# Patient Record
Sex: Male | Born: 1955
Health system: Southern US, Community
[De-identification: ages and names within clinical notes are randomized; demographics above are authoritative.]

## PROBLEM LIST (undated history)

## (undated) DIAGNOSIS — E119 Type 2 diabetes mellitus without complications: Secondary | ICD-10-CM

## (undated) DIAGNOSIS — N289 Disorder of kidney and ureter, unspecified: Secondary | ICD-10-CM

## (undated) DIAGNOSIS — E785 Hyperlipidemia, unspecified: Secondary | ICD-10-CM

## (undated) DIAGNOSIS — N2581 Secondary hyperparathyroidism of renal origin: Secondary | ICD-10-CM

## (undated) DIAGNOSIS — I4891 Unspecified atrial fibrillation: Secondary | ICD-10-CM

## (undated) DIAGNOSIS — J449 Chronic obstructive pulmonary disease, unspecified: Secondary | ICD-10-CM

## (undated) DIAGNOSIS — Z992 Dependence on renal dialysis: Secondary | ICD-10-CM

## (undated) DIAGNOSIS — Z9989 Dependence on other enabling machines and devices: Secondary | ICD-10-CM

## (undated) DIAGNOSIS — D649 Anemia, unspecified: Secondary | ICD-10-CM

## (undated) DIAGNOSIS — R7301 Impaired fasting glucose: Secondary | ICD-10-CM

## (undated) DIAGNOSIS — N189 Chronic kidney disease, unspecified: Secondary | ICD-10-CM

## (undated) DIAGNOSIS — I10 Essential (primary) hypertension: Secondary | ICD-10-CM

## (undated) DIAGNOSIS — I480 Paroxysmal atrial fibrillation: Secondary | ICD-10-CM

## (undated) DIAGNOSIS — M545 Low back pain, unspecified: Secondary | ICD-10-CM

## (undated) DIAGNOSIS — I749 Embolism and thrombosis of unspecified artery: Secondary | ICD-10-CM

## (undated) DIAGNOSIS — G4733 Obstructive sleep apnea (adult) (pediatric): Secondary | ICD-10-CM

## (undated) DIAGNOSIS — N184 Chronic kidney disease, stage 4 (severe): Secondary | ICD-10-CM

## (undated) DIAGNOSIS — J189 Pneumonia, unspecified organism: Secondary | ICD-10-CM

## (undated) DIAGNOSIS — N2889 Other specified disorders of kidney and ureter: Secondary | ICD-10-CM

## (undated) DIAGNOSIS — M1990 Unspecified osteoarthritis, unspecified site: Secondary | ICD-10-CM

## (undated) DIAGNOSIS — M109 Gout, unspecified: Secondary | ICD-10-CM

## (undated) DIAGNOSIS — N179 Acute kidney failure, unspecified: Secondary | ICD-10-CM

## (undated) DIAGNOSIS — H9190 Unspecified hearing loss, unspecified ear: Secondary | ICD-10-CM

## (undated) DIAGNOSIS — E1129 Type 2 diabetes mellitus with other diabetic kidney complication: Secondary | ICD-10-CM

## (undated) DIAGNOSIS — E669 Obesity, unspecified: Secondary | ICD-10-CM

## (undated) HISTORY — DX: Obstructive sleep apnea (adult) (pediatric): G47.33

## (undated) HISTORY — DX: Low back pain: M54.5

## (undated) HISTORY — DX: Impaired fasting glucose: R73.01

## (undated) HISTORY — DX: Embolism and thrombosis of unspecified artery: I74.9

## (undated) HISTORY — DX: Chronic obstructive pulmonary disease, unspecified: J44.9

## (undated) HISTORY — DX: Disorder of kidney and ureter, unspecified: N28.9

## (undated) HISTORY — DX: Anemia, unspecified: D64.9

## (undated) HISTORY — DX: Low back pain, unspecified: M54.50

## (undated) HISTORY — DX: Acute kidney failure, unspecified: N17.9

## (undated) HISTORY — DX: Secondary hyperparathyroidism of renal origin: N25.81

## (undated) HISTORY — DX: Hyperlipidemia, unspecified: E78.5

## (undated) HISTORY — DX: Essential (primary) hypertension: I10

## (undated) HISTORY — DX: Chronic kidney disease, unspecified: N18.9

## (undated) HISTORY — PX: RENAL BIOPSY: SHX156

## (undated) HISTORY — DX: Type 2 diabetes mellitus with other diabetic kidney complication: E11.29

## (undated) HISTORY — PX: OTHER SURGICAL HISTORY: SHX169

## (undated) HISTORY — DX: Dependence on other enabling machines and devices: Z99.89

## (undated) HISTORY — DX: Chronic kidney disease, stage 4 (severe): N18.4

## (undated) HISTORY — DX: Other specified disorders of kidney and ureter: N28.89

## (undated) HISTORY — DX: Unspecified atrial fibrillation: I48.91

## (undated) HISTORY — DX: Dependence on renal dialysis: Z99.2

---

## 2005-09-05 ENCOUNTER — Encounter: Admission: RE | Admit: 2005-09-05 | Discharge: 2005-09-05 | Payer: Self-pay | Admitting: Internal Medicine

## 2007-10-24 ENCOUNTER — Encounter: Admission: RE | Admit: 2007-10-24 | Discharge: 2007-10-24 | Payer: Self-pay | Admitting: Internal Medicine

## 2007-12-08 ENCOUNTER — Ambulatory Visit (HOSPITAL_COMMUNITY): Admission: RE | Admit: 2007-12-08 | Discharge: 2007-12-08 | Payer: Self-pay | Admitting: Nephrology

## 2007-12-08 ENCOUNTER — Encounter (INDEPENDENT_AMBULATORY_CARE_PROVIDER_SITE_OTHER): Payer: Self-pay | Admitting: Interventional Radiology

## 2008-05-11 ENCOUNTER — Encounter: Admission: RE | Admit: 2008-05-11 | Discharge: 2008-05-11 | Payer: Self-pay | Admitting: Nephrology

## 2009-05-12 ENCOUNTER — Encounter: Admission: RE | Admit: 2009-05-12 | Discharge: 2009-05-12 | Payer: Self-pay | Admitting: Nephrology

## 2010-10-10 LAB — CBC
HCT: 43.6
Hemoglobin: 14.8
MCHC: 34.1
RBC: 4.53
WBC: 9.3

## 2011-06-10 ENCOUNTER — Emergency Department (HOSPITAL_COMMUNITY)
Admission: EM | Admit: 2011-06-10 | Discharge: 2011-06-10 | Disposition: A | Discharge status: Home / Self Care | Payer: BC Managed Care – PPO | Attending: Emergency Medicine | Admitting: Emergency Medicine

## 2011-06-10 ENCOUNTER — Encounter (HOSPITAL_COMMUNITY): Payer: Self-pay | Admitting: *Deleted

## 2011-06-10 DIAGNOSIS — F17200 Nicotine dependence, unspecified, uncomplicated: Secondary | ICD-10-CM | POA: Insufficient documentation

## 2011-06-10 DIAGNOSIS — I10 Essential (primary) hypertension: Secondary | ICD-10-CM | POA: Insufficient documentation

## 2011-06-10 DIAGNOSIS — M109 Gout, unspecified: Secondary | ICD-10-CM | POA: Insufficient documentation

## 2011-06-10 HISTORY — DX: Gout, unspecified: M10.9

## 2011-06-10 MED ORDER — PREDNISONE 20 MG PO TABS: 40.0000 mg | ORAL_TABLET | Freq: Every day | ORAL | Status: AC

## 2011-06-10 MED ORDER — CELECOXIB 100 MG PO CAPS: 100.0000 mg | ORAL_CAPSULE | Freq: Two times a day (BID) | ORAL | Status: DC

## 2011-06-10 MED ORDER — OXYCODONE-ACETAMINOPHEN 5-325 MG PO TABS
2.0000 | ORAL_TABLET | Freq: Once | ORAL | Status: AC
  Administered 2011-06-10: 2 via ORAL
  Filled 2011-06-10: qty 2

## 2011-06-10 MED ORDER — COLCHICINE 0.6 MG PO TABS: ORAL_TABLET | ORAL | Status: DC

## 2011-06-10 MED ORDER — OXYCODONE-ACETAMINOPHEN 5-325 MG PO TABS: 1.0000 | ORAL_TABLET | ORAL | Status: AC | PRN

## 2011-06-10 MED ORDER — COLCHICINE 0.6 MG PO TABS
1.2000 mg | ORAL_TABLET | Freq: Once | ORAL | Status: AC
  Administered 2011-06-10: 1.2 mg via ORAL
  Filled 2011-06-10: qty 2

## 2011-06-10 NOTE — Discharge Instructions (Signed)
Gout:  You have been diagnosed with having an attack of acute gouty arthritis - this occurs when a joint has a build up of crystals that cause pain and swelling of the joint.  This can be significantly painful and if you develop severe or worsening pain or swelling or fevers you should return to the ER immediately for a recheck.  Generally this can be expected to get worse over 24-48 hours and then gradually improve over the week.  The pain will be much worse with touching or moving the effected joint.  You should treat this with keeping your joint elevated, and use medicines as listed below that I have prescribed, ice packs - (wrapped in a towel before touching the skin).  The medications used to treat chronic gout and to prevent further attacks cannot be given in the acute phase and must be directed by your family doctor.  Call your doctor today for a recheck in one week.  If you don't have a physician see the list of available physicians at the end of these instructions.  Please read the instructions below in their entirety to help better explain your disease process.    Gout  Gout is an inflammatory condition (arthritis) caused by a buildup of uric acid crystals in the joints. Uric acid is a chemical that is normally present in the blood. Under some circumstances, uric acid can form into crystals in your joints. This causes joint redness, soreness, and swelling (inflammation). Repeat attacks are common. Over time, uric acid crystals can form into masses (tophi) near a joint, causing disfigurement. Gout is treatable and often preventable.  CAUSES  The disease begins with elevated levels of uric acid in the blood. Uric acid is produced by your body when it breaks down a naturally found substance called purines. This also happens when you eat certain foods such as meats and fish. Causes of an elevated uric acid level include:  Being passed down from parent to child (heredity).  Diseases that cause  increased uric acid production (obesity, psoriasis, some cancers).  Excessive alcohol use.  Diet, especially diets rich in meat and seafood.  Medicines, including certain cancer-fighting drugs (chemotherapy), diuretics, and aspirin.  Chronic kidney disease. The kidneys are no longer able to remove uric acid well.  Problems with metabolism.  Conditions strongly associated with gout include:  Obesity.  High blood pressure.  High cholesterol.  Diabetes.  Not everyone with elevated uric acid levels gets gout. It is not understood why some people get gout and others do not. Surgery, joint injury, and eating too much of certain foods are some of the factors that can lead to gout.  SYMPTOMS  An attack of gout comes on quickly. It causes intense pain with redness, swelling, and warmth in a joint.  Fever can occur.  Often, only one joint is involved. Certain joints are more commonly involved:  Base of the big toe.  Knee.  Ankle.  Wrist.  Finger.  Without treatment, an attack usually goes away in a few days to weeks. Between attacks, you usually will not have symptoms, which is different from many other forms of arthritis.  DIAGNOSIS  Your caregiver will suspect gout based on your symptoms and exam. Removal of fluid from the joint (arthrocentesis) is done to check for uric acid crystals. Your caregiver will give you a medicine that numbs the area (local anesthetic) and use a needle to remove joint fluid for exam. Gout is confirmed when uric acid crystals  are seen in joint fluid, using a special microscope. Sometimes, blood, urine, and X-ray tests are also used.  TREATMENT  There are 2 phases to gout treatment: treating the sudden onset (acute) attack and preventing attacks (prophylaxis).  Treatment of an Acute Attack  Medicines are used. These include anti-inflammatory medicines or steroid medicines.  An injection of steroid medicine into the affected joint is sometimes necessary.  The painful  joint is rested. Movement can worsen the arthritis.  You may use warm or cold treatments on painful joints, depending which works best for you.  Discuss the use of coffee, vitamin C, or cherries with your caregiver. These may be helpful treatment options.  Treatment to Prevent Attacks  After the acute attack subsides, your caregiver may advise prophylactic medicine. These medicines either help your kidneys eliminate uric acid from your body or decrease your uric acid production. You may need to stay on these medicines for a very long time.  The early phase of treatment with prophylactic medicine can be associated with an increase in acute gout attacks. For this reason, during the first few months of treatment, your caregiver may also advise you to take medicines usually used for acute gout treatment. Be sure you understand your caregiver's directions.  You should also discuss dietary treatment with your caregiver. Certain foods such as meats and fish can increase uric acid levels. Other foods such as dairy can decrease levels. Your caregiver can give you a list of foods to avoid.  HOME CARE INSTRUCTIONS  Do not take aspirin to relieve pain. This raises uric acid levels.  Only take over-the-counter or prescription medicines for pain, discomfort, or fever as directed by your caregiver.  Rest the joint as much as possible. When in bed, keep sheets and blankets off painful areas.  Keep the affected joint raised (elevated).  Use crutches if the painful joint is in your leg.  Drink enough water and fluids to keep your urine clear or pale yellow. This helps your body get rid of uric acid. Do not drink alcoholic beverages. They slow the passage of uric acid.  Follow your caregiver's dietary instructions. Pay careful attention to the amount of protein you eat. Your daily diet should emphasize fruits, vegetables, whole grains, and fat-free or low-fat milk products.  Maintain a healthy body weight.  SEEK  MEDICAL CARE IF:  You have an oral temperature above 101 F (38.0 C).  You develop diarrhea, vomiting, or any side effects from medicines.  You do not feel better in 24 hours, or you are getting worse.  SEEK IMMEDIATE MEDICAL CARE IF:  Your joint becomes suddenly more tender and you have:  Chills.  An oral temperature above 112 F (38.0 C)  MAKE SURE YOU:  Understand these instructions.  Will watch your condition.  Will get help right away if you are not doing well or get worse.   Document Released: 12/23/1999 Document Revised: 12/14/2010 Document Reviewed: 04/04/2009  [facility] Patient Information 2012 [place].   The medicines that are used to treat gout are generally antiinflammatory medications and as with ALL medications they can causae side effects and should not be used for prolonged periods of time.  You may have been prescribed the following:  1.  Colchicine - take one tablen (0.6mg ) by mouth every 1-2 hours until one of the following occurs:   A) The pain goes away  B) You have reached the maximum dose which is 3 tablets in 3 hours or 10 tablets  in 24 hours  C) You have side effects such as diarrhea, severe gas or nausea that prevent you from taking the medications   Colchicine should not be used for longer than 24 hours and if taken should not be used for 1 week after it was taken.  If you are a diabetic or have kidney problems you should exercise caution in taking this medications - consult with your pharmacist regarding use of these medications especially if you are taking other medications for  Acute or chronic conditions.  2.  Prednisone - for severe or recurrent gout this may help reduce the swelling and inflammation  3.  Celebrex - this is a type of NSAID similar to motrin / ibuprofen or aleve/naprosyn and should not be taken with these other medications that are NSAIDS as well.  4.  Percocet - This is an opiate medications and should be used with  caution.  Acetaminophen; Oxycodone tablets or PERCOCET  This is a pain reliever. It is used to treat mild to moderate pain.  This medicine may be used for other purposes; ask your health care provider or pharmacist if you have questions.  What should I tell my health care provider before I take this medicine?  They need to know if you have any of these conditions:  -brain tumor  -Crohn's disease, inflammatory bowel disease, or ulcerative colitis  -drink more than 3 alcohol containing drinks per day  -drug abuse or addiction  -head injury  -heart or circulation problems  -kidney disease or problems going to the bathroom  -liver disease  -lung disease, asthma, or breathing problems  -an unusual or allergic reaction to acetaminophen, oxycodone, other opioid analgesics, other medicines, foods, dyes, or preservatives  -pregnant or trying to get pregnant  -breast-feeding  How should I use this medicine?  Take this medicine by mouth with a full glass of water. Follow the directions on the prescription label. Take your medicine at regular intervals. Do not take your medicine more often than directed.  Talk to your pediatrician regarding the use of this medicine in children. Special care may be needed.  Patients over 14 years old may have a stronger reaction and need a smaller dose.  Overdosage: If you think you have taken too much of this medicine contact a poison control center or emergency room at once.  NOTE: This medicine is only for you. Do not share this medicine with others.   What may interact with this medicine?  -alcohol or medicines that contain alcohol  -antihistamines  -barbiturates like amobarbital, butalbital, butabarbital, methohexital, pentobarbital, phenobarbital, thiopental, and secobarbital  -benztropine  -drugs for bladder problems like solifenacin, trospium, oxybutynin, tolterodine, hyoscyamine, and methscopolamine  -drugs for breathing problems like ipratropium and  tiotropium  -drugs for certain stomach or intestine problems like propantheline, homatropine methylbromide, glycopyrrolate, atropine, belladonna, and dicyclomine  -general anesthetics like etomidate, ketamine, nitrous oxide, propofol, desflurane, enflurane, halothane, isoflurane, and sevoflurane  -medicines for depression, anxiety, or psychotic disturbances  -medicines for pain like codeine, morphine, pentazocine, buprenorphine, butorphanol, nalbuphine, tramadol, and propoxyphene  -medicines for sleep  -muscle relaxants  -naltrexone  -phenothiazines like perphenazine, thioridazine, chlorpromazine, mesoridazine, fluphenazine, prochlorperazine, promazine, and trifluoperazine  -scopolamine  -trihexyphenidyl  This list may not describe all possible interactions. Give your health care provider a list of all the medicines, herbs, non-prescription drugs, or dietary supplements you use. Also tell them if you smoke, drink alcohol, or use illegal drugs. Some items may interact with your medicine.  What  should I watch for while using this medicine?  Tell your doctor or health care professional if your pain does not go away, if it gets worse, or if you have new or a different type of pain Do not suddenly stop taking your medicine because you may develop a severe reaction. Your body becomes used to the medicine. This does NOT mean you are addicted. Addiction is a behavior related to getting and using a drug for a nonmedical reason.  You may get drowsy or dizzy. Do not drive, use machinery, or do anything that needs mental alertness until you know how this medicine affects you. Do not stand or sit up quickly, especially if you are an older patient. This reduces the risk of dizzy or fainting spells. Alcohol may interfere with the effect of this medicine. Avoid alcoholic drinks.  The medicine will cause constipation. Try to have a bowel movement at least every 2 to 3 days. If you do not have a bowel movement for 3  days, call your doctor or health care professional.  Do not take Tylenol (acetaminophen) or medicines that have acetaminophen with this medicine. Too much acetaminophen can be very dangerous. Many nonprescription medicines contain acetaminophen. Always read the labels carefully to avoid taking more acetaminophen.   What side effects may I notice from receiving this medicine?  Side effects that you should report to your doctor or health care professional as soon as possible:  -allergic reactions like skin rash, itching or hives, swelling of the face, lips, or tongue  -breathing difficulties, wheezing  -confusion  -light headedness or fainting spells  -severe stomach pain  -yellowing of the skin or the whites of the eyes  Side effects that usually do not require medical attention (report to your doctor or health care professional if they continue or are bothersome):  -dizziness  -drowsiness  -nausea  -vomiting  This list may not describe all possible side effects. Call your doctor for medical advice about side effects. You may report side effects to FDA at 1-800-FDA-1088.  Where should I keep my medicine?  Keep out of the reach of children. This medicine can be abused. Keep your medicine in a safe place to protect it from theft. Do not share this medicine with anyone. Selling or giving away this medicine is dangerous and against the law.   NOTE: This sheet is a summary. It may not cover all possible information. If you have questions about this medicine, talk to your doctor, pharmacist, or health care provider.      RESOURCE GUIDE  Dental Problems  Patients with Medicaid: [place] Phone:  [phone]                                                  Phone:  [phone]  If unable to pay or uninsured, contact:  [facility] or [facility].  to become qualified for the adult dental  clinic.  Chronic Pain Problems Contact [facility]  [phone] Patients need to be referred by their primary care doctor.  Insufficient Money for Medicine Contact [employer]:  call "211" or [place] [phone].  No Primary Care Doctor Call [facility]  [phone] Other agencies that provide inexpensive medical care    [place]  [phone]    [facility]  [place]  [phone]    [facility]  [phone]    [facility]  [place]  [place]  [phone] [place]   [phone] (emergency services [phone])  [facility]  [phone] [facility] [phone] [facility] [place] [phone] [place] P[place] [phone] Residential & Outpatient Substance Abuse Program  [phone]  Abuse/Neglect [place] [phone] [place] [phone] (After Hours)  Emergency [place] [phone]  [facility] [place] [phone] [place] [phone]  MRSA Hotline #:   512-530-1972    [facility] Dept. [address]      [place] Phone:  [phone]                                   Phone:  [phone]                 Phone:  [place] Phone:  [place] [phone] (After Hours)

## 2011-06-10 NOTE — ED Notes (Signed)
Pt says he is having a flare up of his gout over the past couple of days, pain started to get worse around midnight- c/o pain in right hand and both feet

## 2011-06-10 NOTE — ED Provider Notes (Signed)
History     CSN: [id number]  Arrival date & time 06/10/11  0453   First MD Initiated Contact with Patient 06/10/11 0501      Chief Complaint  Patient presents with  . Gout    (Consider location/radiation/quality/duration/timing/severity/associated sxs/prior treatment) HPI Comments: 56 year old male with a history of gout, hypertension who presents with a complaint of bilateral foot pain at the first metatarsal phalangeal joint as well as right hand pain. This started approximately 2 days ago it is gradually gotten worse, has severe pain with even light touch of his joints as stated above. He denies fevers, has mild swelling and erythema over said joints, worse with range of motion and palpation. The symptoms are similar to prior gout flares. He does take allopurinol but states this has not helped the last several days.  The history is provided by the patient.    Past Medical History  Diagnosis Date  . Gout   . Hypertension     Past Surgical History  Procedure Date  . Left foot surgery     No family history on file.  History  Substance Use Topics  . Smoking status: Current Everyday Smoker  . Smokeless tobacco: Not on file  . Alcohol Use: No      Review of Systems  Constitutional: Negative for fever and chills.  HENT: Negative for sore throat and neck pain.   Eyes: Negative for visual disturbance.  Respiratory: Negative for cough and shortness of breath.   Cardiovascular: Negative for chest pain.  Gastrointestinal: Negative for nausea, vomiting, abdominal pain and diarrhea.  Genitourinary: Negative for dysuria and frequency.  Musculoskeletal: Positive for joint swelling and gait problem ( Secondary to foot pain). Negative for back pain.  Skin: Negative for rash.  Neurological: Negative for weakness, numbness and headaches.  Hematological: Negative for adenopathy.  Psychiatric/Behavioral: Negative for behavioral problems.    Allergies  Review of patient's  allergies indicates no known allergies.  Home Medications   Current Outpatient Rx  Name Route Sig Dispense Refill  . CELECOXIB 100 MG PO CAPS Oral Take 1 capsule (100 mg total) by mouth 2 (two) times daily. 20 capsule 0  . COLCHICINE 0.6 MG PO TABS  Take 0.6mg  (one tablet) by mouth every 1-2 hours until one of the following occurs: 1.  The pain is gone 2.  The maximum dose has been given ( no more than 3 tabs in 3 hours or 10 tabs in 24 hours) 3.  The side effects outweight the benefits 10 tablet 0  . OXYCODONE-ACETAMINOPHEN 5-325 MG PO TABS Oral Take 1 tablet by mouth every 4 (four) hours as needed for pain. 20 tablet 0  . PREDNISONE 20 MG PO TABS Oral Take 2 tablets (40 mg total) by mouth daily. Take 40 mg by mouth daily for 3 days, then 20mg  by mouth daily for 3 days, then 10mg  daily for 3 days 12 tablet 0    BP 146/77  Pulse 76  Temp(Src) 97.9 F (36.6 C) (Oral)  Resp 18  SpO2 96%  Physical Exam  Nursing note and vitals reviewed. Constitutional: He appears well-developed and well-nourished. No distress.  HENT:  Head: Normocephalic and atraumatic.  Mouth/Throat: Oropharynx is clear and moist. No oropharyngeal exudate.  Eyes: Conjunctivae and EOM are normal. Pupils are equal, round, and reactive to light. Right eye exhibits no discharge. Left eye exhibits no discharge. No scleral icterus.  Neck: Normal range of motion. Neck supple. No JVD present. No thyromegaly present.  Cardiovascular:  Normal rate, regular rhythm, normal heart sounds and intact distal pulses.  Exam reveals no gallop and no friction rub.   No murmur heard. Pulmonary/Chest: Effort normal and breath sounds normal. No respiratory distress. He has no wheezes. He has no rales.  Abdominal: Soft. Bowel sounds are normal. He exhibits no distension and no mass. There is no tenderness.  Musculoskeletal: Normal range of motion. He exhibits tenderness ( Significant tenderness to palpation over the bilateral feet at the first  metatarsal phalangeal joints, worse with range of motion of the great toe. Pain at the proximal interphalangeal joint of the right hand, ring finger.). He exhibits no edema.  Lymphadenopathy:    He has no cervical adenopathy.  Neurological: He is alert. Coordination normal.  Skin: Skin is warm and dry. No rash noted. No erythema.  Psychiatric: He has a normal mood and affect. His behavior is normal.    ED Course  Procedures (including critical care time)  Labs Reviewed - No data to display No results found.   1. Gout attack       MDM  The patient appears uncomfortable from the joint arthropathies. This is likely acute gouty arthritis, culture seen given, Percocet given, patient given prescriptions for prednisone, Celebrex, culture seen and Percocet. I have given him written instructions on how to followup with his doctor or the ER should his symptoms worsen. Doubt septic arthritis, can followup as indicated. Vital signs are stable  Discharge Prescriptions include:  Percocet Prednisone Colchicine Celebrex        Johnna Acosta, MD 06/10/11 [phone]

## 2011-07-03 ENCOUNTER — Emergency Department (HOSPITAL_COMMUNITY)
Admission: EM | Admit: 2011-07-03 | Discharge: 2011-07-03 | Disposition: A | Discharge status: Home / Self Care | Payer: BC Managed Care – PPO | Attending: Emergency Medicine | Admitting: Emergency Medicine

## 2011-07-03 ENCOUNTER — Emergency Department (HOSPITAL_COMMUNITY): Payer: BC Managed Care – PPO

## 2011-07-03 ENCOUNTER — Encounter (HOSPITAL_COMMUNITY): Payer: Self-pay | Admitting: Emergency Medicine

## 2011-07-03 DIAGNOSIS — N289 Disorder of kidney and ureter, unspecified: Secondary | ICD-10-CM | POA: Insufficient documentation

## 2011-07-03 DIAGNOSIS — E860 Dehydration: Secondary | ICD-10-CM

## 2011-07-03 DIAGNOSIS — R0602 Shortness of breath: Secondary | ICD-10-CM | POA: Insufficient documentation

## 2011-07-03 DIAGNOSIS — H7290 Unspecified perforation of tympanic membrane, unspecified ear: Secondary | ICD-10-CM

## 2011-07-03 LAB — CBC
Hemoglobin: 15 g/dL (ref 13.0–17.0)
MCHC: 35.3 g/dL (ref 30.0–36.0)
RBC: 4.63 MIL/uL (ref 4.22–5.81)
RDW: 12.7 % (ref 11.5–15.5)

## 2011-07-03 LAB — BASIC METABOLIC PANEL
CO2: 23 mEq/L (ref 19–32)
Calcium: 10.2 mg/dL (ref 8.4–10.5)
Chloride: 98 mEq/L (ref 96–112)
Creatinine, Ser: 2.84 mg/dL — ABNORMAL HIGH (ref 0.50–1.35)
GFR calc Af Amer: 27 mL/min — ABNORMAL LOW (ref >90)

## 2011-07-03 LAB — POCT I-STAT TROPONIN I: Troponin i, poc: 0 ng/mL (ref 0.00–0.08)

## 2011-07-03 MED ORDER — AMOXICILLIN 500 MG PO CAPS: 1000.0000 mg | ORAL_CAPSULE | Freq: Three times a day (TID) | ORAL | Status: AC

## 2011-07-03 NOTE — ED Notes (Signed)
PA-C at bedside 

## 2011-07-03 NOTE — ED Provider Notes (Signed)
History     CSN: [id number]  Arrival date & time 07/03/11  1243   First MD Initiated Contact with Patient 07/03/11 1302      Chief Complaint  Patient presents with  . Weakness    (Consider location/radiation/quality/duration/timing/severity/associated sxs/prior treatment) The history is provided by the patient and a relative.  Pt is a 56 y/o M with PMH HTN, HLD, gout with recent flare, and unknown renal disorder who presents to the ED with c/c of generalized weakness. Was unloading boxes of produce from a truck when he suddenly felt weak all over with assoc cold chills. He lowered himself to a seated position without falling or sustaining any injury. Around the same time, a coworker commented on blood draining from his right hear- there was no ear pain, ringing, "pop" or sensation of decreased hearing. Denies any recent illness. No recent fall or head injury. Does clean ears with q-tips. No known fever. Denies HA, visual disturbance, dizziness, CP, palpitations, SOB, nausea, extremity weakness or numbness, or incontinence. Denies exacerbating or alleviating factors. No prior treatment attempted.  Past Medical History  Diagnosis Date  . Gout   . Hypertension   . Renal disorder     Past Surgical History  Procedure Date  . Left foot surgery   . Renal biopsy     History reviewed. No pertinent family history.  History  Substance Use Topics  . Smoking status: Current Everyday Smoker  . Smokeless tobacco: Not on file  . Alcohol Use: No      Review of Systems 10 systems reviewed and are negative for acute change except as noted in the HPI.  Allergies  Review of patient's allergies indicates no known allergies.  Home Medications   Current Outpatient Rx  Name Route Sig Dispense Refill  . ACETAMINOPHEN ER 650 MG PO TBCR Oral Take 1,300 mg by mouth every 8 (eight) hours as needed. For pain    . ALLOPURINOL 100 MG PO TABS Oral Take 200 mg by mouth daily.    . ASPIRIN EC 81 MG  PO TBEC Oral Take 81 mg by mouth daily.    . ATORVASTATIN CALCIUM 80 MG PO TABS Oral Take 80 mg by mouth daily.    . COLCHICINE 0.6 MG PO TABS  Take 0.6mg  (one tablet) by mouth every 1-2 hours until one of the following occurs: 1.  The pain is gone 2.  The maximum dose has been given ( no more than 3 tabs in 3 hours or 10 tabs in 24 hours) 3.  The side effects outweight the benefits 10 tablet 0  . FENOFIBRATE 54 MG PO TABS Oral Take 54 mg by mouth daily.    Marland Kitchen HYDROCHLOROTHIAZIDE 25 MG PO TABS Oral Take 25 mg by mouth daily.    Marland Kitchen SPIRONOLACTONE 50 MG PO TABS Oral Take 100 mg by mouth daily. At noon    . VERAPAMIL HCL ER 360 MG PO CP24 Oral Take 360 mg by mouth at bedtime.      BP 133/76  Pulse 99  Temp 98.7 F (37.1 C) (Oral)  Resp 20  Ht 6' (1.829 m)  Wt 265 lb (120.203 kg)  BMI 35.94 kg/m2  SpO2 99%  Physical Exam  Nursing note reviewed. Constitutional: He is oriented to person, place, and time. He appears well-developed and well-nourished. No distress.       VS reviewed, sig for HTN.  HENT:  Head: Normocephalic and atraumatic.  Right Ear: There is drainage (dried blood). Tympanic  membrane is perforated. Decreased hearing (to finger rub) is noted.  Left Ear: Hearing, tympanic membrane, external ear and ear canal normal.  Eyes: Conjunctivae and EOM are normal. Pupils are equal, round, and reactive to light.       No nystagmus. Visual fields full.  Neck: Normal range of motion. Neck supple.       No bruit heard  Cardiovascular: Normal rate, regular rhythm and normal heart sounds.   Pulses:      Radial pulses are 2+ on the right side, and 2+ on the left side.       Dorsalis pedis pulses are 2+ on the right side, and 2+ on the left side.  Pulmonary/Chest: Effort normal and breath sounds normal. No respiratory distress. He has no wheezes. He has no rales.  Abdominal: Soft. Bowel sounds are normal. There is no tenderness.       obese  Musculoskeletal: He exhibits no edema.        Calves supple and non-tender  Neurological: He is alert and oriented to person, place, and time. No cranial nerve deficit (3-12 intact). Coordination (F-N intact bilatearally) normal.    ED Course  Procedures (including critical care time)  Labs Reviewed  CBC - Abnormal; Notable for the following:    WBC 12.8 (*)     All other components within normal limits  BASIC METABOLIC PANEL - Abnormal; Notable for the following:    Sodium 132 (*)     Glucose, Bld 128 (*)     BUN 59 (*)     Creatinine, Ser 2.84 (*)     GFR calc non Af Amer 23 (*)     GFR calc Af Amer 27 (*)     All other components within normal limits  POCT I-STAT TROPONIN I   Dg Chest 2 View  07/03/2011  *RADIOLOGY REPORT*  Clinical Data: Weakness and short of breath  CHEST - 2 VIEW  Comparison: None.  Findings: The heart size is normal.  Negative for heart failure. Lungs are clear without infiltrate or effusion.  No mass lesion. Thoracic osteophytes are noted.  IMPRESSION: No acute cardiopulmonary disease.  Original Report Authenticated By: Truett Perna, M.D.    Date: 07/03/2011  Rate: 78  Rhythm: normal sinus rhythm  QRS Axis: normal  Intervals: normal  ST/T Wave abnormalities: normal  Conduction Disutrbances:none  Old EKG Reviewed: none available    1. Dehydration   2. Renal insufficiency   3. Ruptured tympanic membrane       MDM  Pt with hx DM, HTN to ED for eval of generalized weakness. Spoke with Dr Hassell Done with nephrology, who relates that pt SCr in 12/2010 was 2.2. Last seen in office by Dr Moshe Cipro 02/2011. Slight worsening of renal function likely pre-renal related to dehydration that is multifactorial. No BP drop, but HR increased with orthostatic VS. Pt encouraged to increase PO intake over the next several days. EKG unremarkable. CXR reviewed with no acute findings. Slight leukocytosis likely related to both hemoconcentration from dehydration and recent illness. No anemia. TM rupture on exam with  poorly visualized bony landmarks, decreased hearing. Pt will be started on prophylactic abx, advised to f.u with ENT.  Temperature is recheck by myself and is 98.7 orally.  All results are discussed with patient and family, who voice understanding and the need for appropriate follow-up.   Orlie Dakin, [place] 07/03/11 1637

## 2011-07-03 NOTE — ED Notes (Signed)
NAD noted at time of d/c home with family. Pt and wife verbalized understanding of d/c inst.

## 2011-07-03 NOTE — ED Provider Notes (Signed)
Medical screening examination/treatment/procedure(s) were performed by non-physician practitioner and as supervising physician I was immediately available for consultation/collaboration.  Richarda Blade, MD 07/03/11 [phone]

## 2011-07-03 NOTE — ED Notes (Signed)
Pt brought in via EMS for sudden onset of weakness. Pt has dried blood in and around right ear and denies any foreign body in ear. Pt denies pain or dizziness. Pt was seen for gout earlier this month and given Rx for Colchicine which he started on Saturday.

## 2011-07-03 NOTE — Discharge Instructions (Signed)
As discussed today, stop taking your colchicine.   You should see Dr Joylene Draft by the end of the week for a recheck. In the meantime, make sure you are eating properly and getting in plenty of fluids. You kidney function looks slightly worse today than 6 months ago- this can be rechecked by Dr Joylene Draft after you make attempts to rehydrate yourself over the next few days.  You are being given a prescription for an antibiotic today to prevent infection in your ear. Take this as directed and follow-up with the ENT doctor listed above at their soonest appointment. In the meantime, make extra efforts to keep water out of the ear and do not insert any objects into the ear canal.    Weakness Weakness can be caused by many things. Your doctor has checked for the most common causes. HOME [place] a well-balanced diet.   Exercise every day.   Go to follow-up appointments.  GET HELP RIGHT AWAY IF:   There is chest pain or chest pressure.   You have problems breathing.   You cannot do usual daily activities.   You have problems speaking or swallowing.   You have a very bad headache or belly (abdominal) pain.   The heartbeat does not feel normal or the pulse is very fast.   You are confused.   You have vision problems or problems walking.   You have chills.   You or your child has a temperature by mouth above 102 F (38.9 C), not controlled by medicine.   Your baby is older than 3 months with a rectal temperature of 102 F (38.9 C) or higher.   Your baby is 67 months old or younger with a rectal temperature of 100.4 F (38 C) or higher.  MAKE SURE YOU:   Understand these instructions.   Will watch this condition.   Will get help right away if you are not doing well or get worse.  Document Released: 12/08/2007 Document Revised: 12/14/2010 Document Reviewed: 12/08/2007 [facility] Patient Information 2012 [place].    Eardrum Perforation A ruptured eardrum can be  caused by infection or injury. Injury may result from a loud noise, foreign objects in the ears, inserting cotton-tipped swabs, trauma such as a hard slap, or changes in pressure such as scuba diving, flying, or driving in the mountains. Symptoms can include hearing loss, pain, ringing in the ears and discharge or bleeding from the ear. More serious eardrum injuries may cause dizziness, vomiting, or even facial paralysis. You should see your caregiver right away if you have these more serious symptoms. Ruptured eardrums usually heal on their own if the ear is kept dry. Do not go swimming or get wet. Place a cotton ball covered with petroleum jelly in the ear when showering or washing your hair. Antibiotics (oral or ear drops) may be needed to treat or prevent infection. While surgery is rarely required, placement of a small paper patch over the perforation improves the chance that healing will occur if it does not heal on its own. See your caregiver for follow-up as recommended to be sure the eardrum is healing properly.  SEEK IMMEDIATE MEDICAL CARE IF:   You have bleeding or pus-like (purulent) material coming from your ear.   You have problems with balance, feel dizzy, or feel sick to your stomach (nausea) and vomit.   You develop increased pain.   You have a fever.  Document Released: 02/02/2004 Document Revised: 09/06/2010 Document  Reviewed: 10/29/2008 [facility] Patient Information 2012 [place].

## 2013-05-18 IMAGING — CR DG CHEST 2V
2 series · 2 of 2 positions shown · non-contrast
Comparison: None.

CLINICAL DATA: Weakness and short of breath

CHEST - 2 VIEW

[w chest pa]
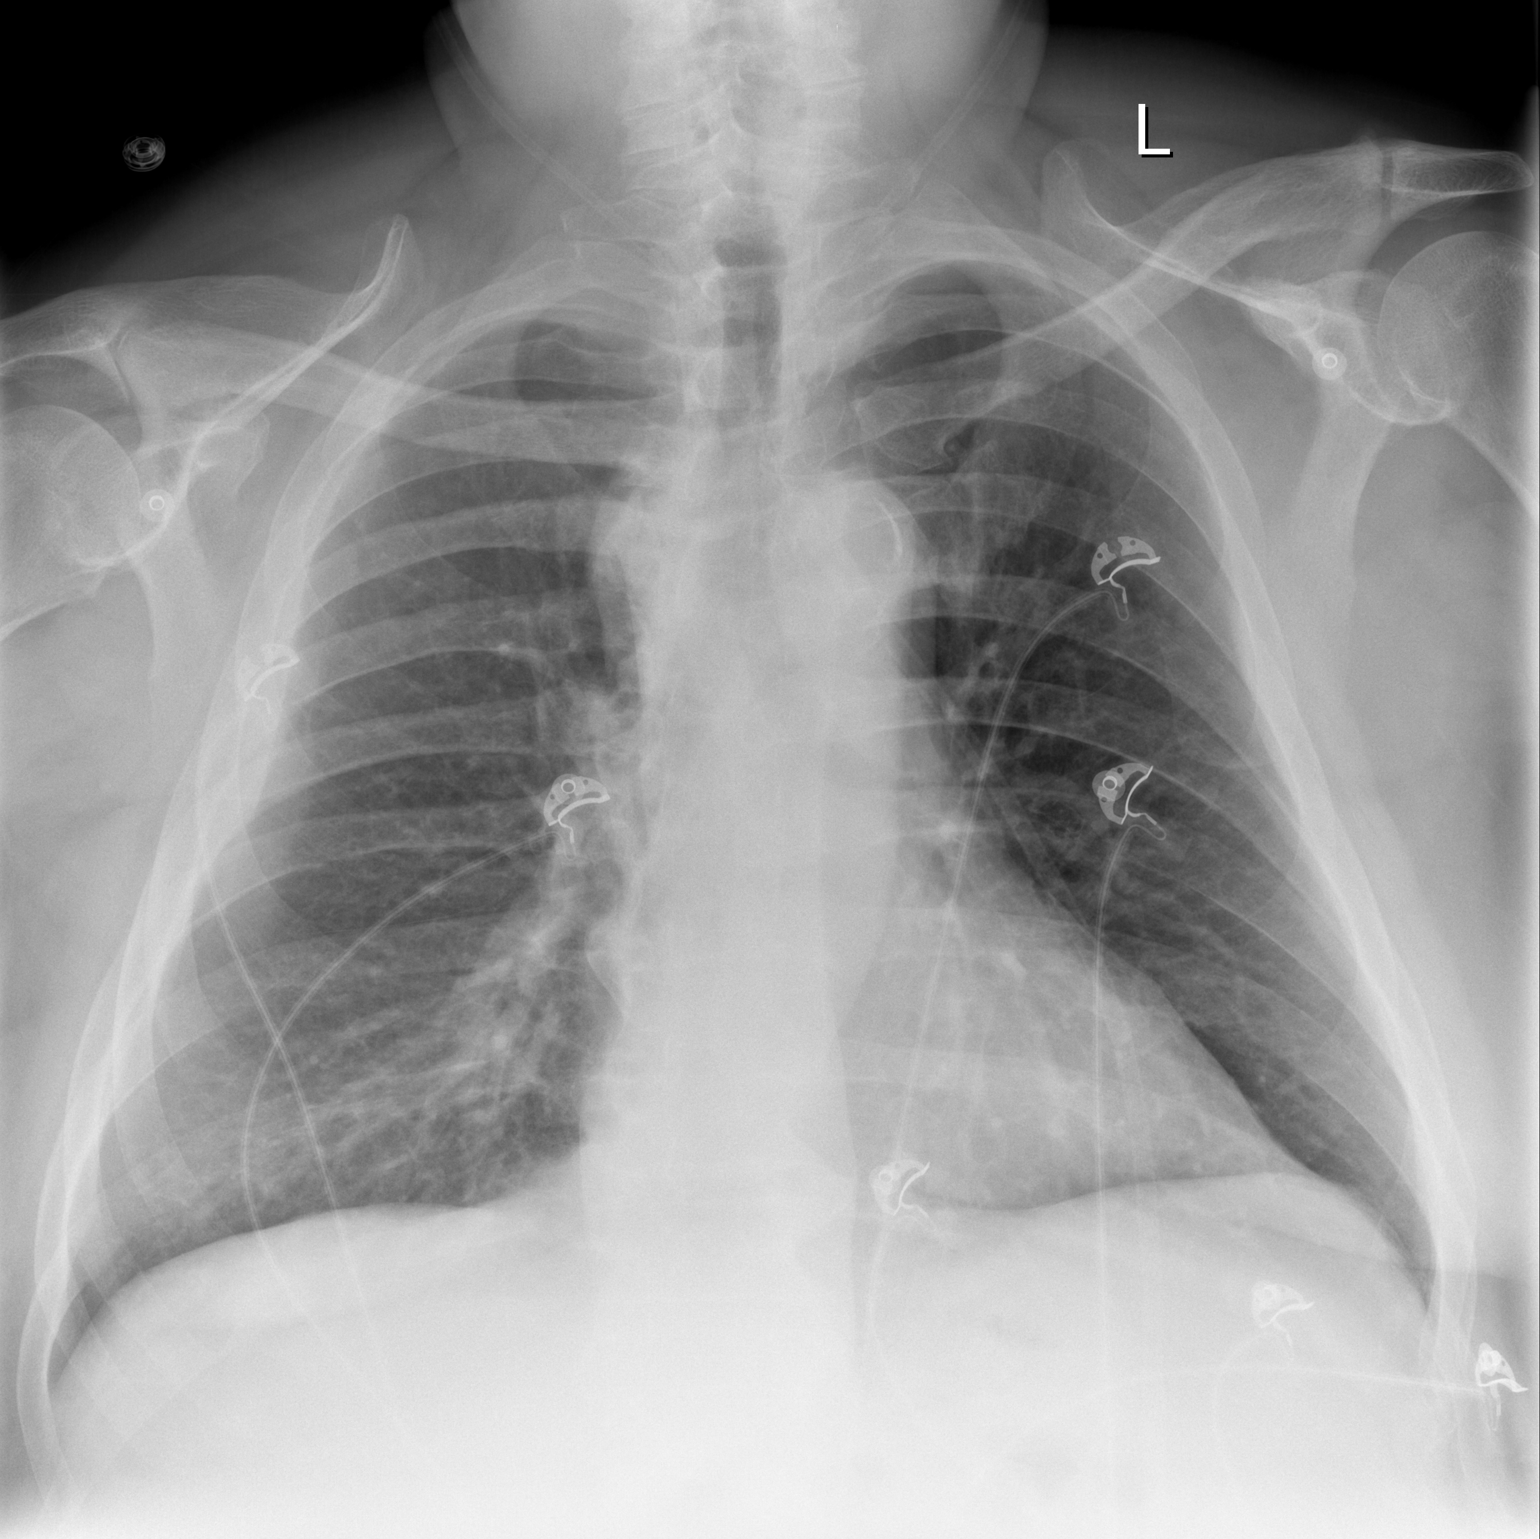

[w chest lat]
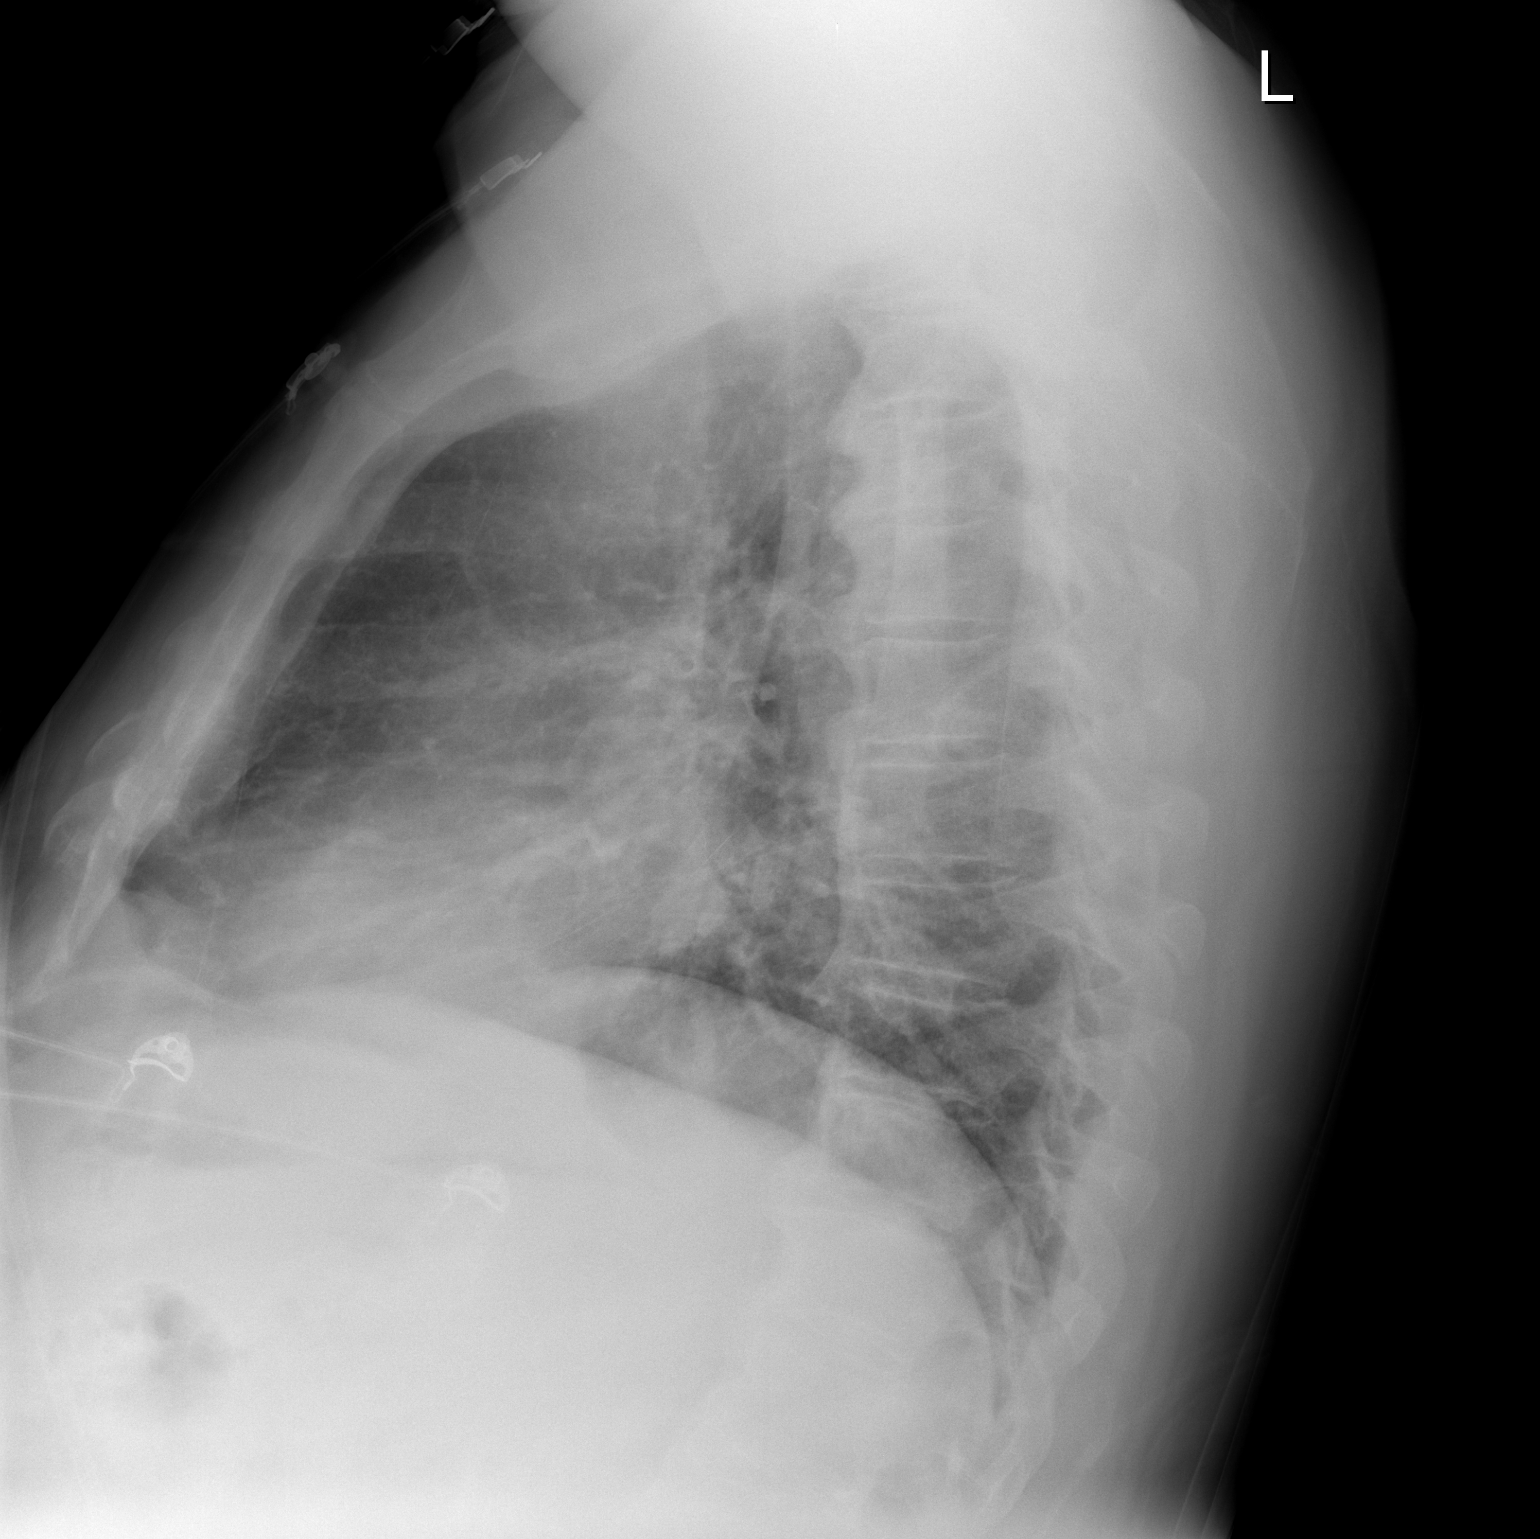

[2 of 2 positions shown; findings below may reference images not displayed]

FINDINGS: The heart size is normal.  Negative for heart failure.
Lungs are clear without infiltrate or effusion.  No mass lesion.
Thoracic osteophytes are noted.
IMPRESSION: No acute cardiopulmonary disease.

## 2013-07-20 ENCOUNTER — Other Ambulatory Visit: Payer: Self-pay | Admitting: Internal Medicine

## 2013-07-20 DIAGNOSIS — Z0000 Encounter for general adult medical examination without abnormal findings: Secondary | ICD-10-CM

## 2013-07-23 ENCOUNTER — Other Ambulatory Visit: Payer: Self-pay | Admitting: Internal Medicine

## 2013-07-23 ENCOUNTER — Ambulatory Visit
Admission: RE | Admit: 2013-07-23 | Discharge: 2013-07-23 | Disposition: A | Discharge status: Home / Self Care | Payer: BC Managed Care – PPO | Source: Ambulatory Visit | Attending: Internal Medicine | Admitting: Internal Medicine

## 2013-07-23 DIAGNOSIS — Z0000 Encounter for general adult medical examination without abnormal findings: Secondary | ICD-10-CM

## 2013-07-24 ENCOUNTER — Other Ambulatory Visit: Payer: BC Managed Care – PPO

## 2014-07-18 ENCOUNTER — Emergency Department (HOSPITAL_COMMUNITY): Payer: BLUE CROSS/BLUE SHIELD

## 2014-07-18 ENCOUNTER — Inpatient Hospital Stay (HOSPITAL_COMMUNITY)
Admission: EM | Admit: 2014-07-18 | Discharge: 2014-07-19 | DRG: 309 | Disposition: A | Discharge status: Home / Self Care | Payer: BLUE CROSS/BLUE SHIELD | Attending: Cardiovascular Disease | Admitting: Cardiovascular Disease

## 2014-07-18 ENCOUNTER — Encounter (HOSPITAL_COMMUNITY): Payer: Self-pay | Admitting: Emergency Medicine

## 2014-07-18 DIAGNOSIS — F17210 Nicotine dependence, cigarettes, uncomplicated: Secondary | ICD-10-CM | POA: Diagnosis present

## 2014-07-18 DIAGNOSIS — E785 Hyperlipidemia, unspecified: Secondary | ICD-10-CM | POA: Diagnosis present

## 2014-07-18 DIAGNOSIS — I4891 Unspecified atrial fibrillation: Secondary | ICD-10-CM | POA: Diagnosis present

## 2014-07-18 DIAGNOSIS — S50312A Abrasion of left elbow, initial encounter: Secondary | ICD-10-CM | POA: Diagnosis present

## 2014-07-18 DIAGNOSIS — M1990 Unspecified osteoarthritis, unspecified site: Secondary | ICD-10-CM | POA: Diagnosis present

## 2014-07-18 DIAGNOSIS — E781 Pure hyperglyceridemia: Secondary | ICD-10-CM | POA: Diagnosis present

## 2014-07-18 DIAGNOSIS — I129 Hypertensive chronic kidney disease with stage 1 through stage 4 chronic kidney disease, or unspecified chronic kidney disease: Secondary | ICD-10-CM | POA: Diagnosis present

## 2014-07-18 DIAGNOSIS — Z6832 Body mass index (BMI) 32.0-32.9, adult: Secondary | ICD-10-CM | POA: Diagnosis not present

## 2014-07-18 DIAGNOSIS — E1122 Type 2 diabetes mellitus with diabetic chronic kidney disease: Secondary | ICD-10-CM | POA: Diagnosis present

## 2014-07-18 DIAGNOSIS — N184 Chronic kidney disease, stage 4 (severe): Secondary | ICD-10-CM | POA: Diagnosis present

## 2014-07-18 DIAGNOSIS — W2211XA Striking against or struck by driver side automobile airbag, initial encounter: Secondary | ICD-10-CM | POA: Diagnosis present

## 2014-07-18 DIAGNOSIS — Z7982 Long term (current) use of aspirin: Secondary | ICD-10-CM

## 2014-07-18 DIAGNOSIS — Z6836 Body mass index (BMI) 36.0-36.9, adult: Secondary | ICD-10-CM | POA: Diagnosis present

## 2014-07-18 DIAGNOSIS — E669 Obesity, unspecified: Secondary | ICD-10-CM | POA: Diagnosis present

## 2014-07-18 DIAGNOSIS — Z23 Encounter for immunization: Secondary | ICD-10-CM | POA: Diagnosis not present

## 2014-07-18 DIAGNOSIS — I480 Paroxysmal atrial fibrillation: Secondary | ICD-10-CM | POA: Diagnosis present

## 2014-07-18 DIAGNOSIS — M109 Gout, unspecified: Secondary | ICD-10-CM | POA: Diagnosis present

## 2014-07-18 DIAGNOSIS — I10 Essential (primary) hypertension: Secondary | ICD-10-CM | POA: Diagnosis not present

## 2014-07-18 HISTORY — DX: Type 2 diabetes mellitus without complications: E11.9

## 2014-07-18 HISTORY — DX: Obesity, unspecified: E66.9

## 2014-07-18 HISTORY — DX: Chronic kidney disease, stage 4 (severe): N18.4

## 2014-07-18 HISTORY — DX: Unspecified osteoarthritis, unspecified site: M19.90

## 2014-07-18 HISTORY — DX: Paroxysmal atrial fibrillation: I48.0

## 2014-07-18 LAB — CBC WITH DIFFERENTIAL/PLATELET
Basophils Absolute: 0 10*3/uL (ref 0.0–0.1)
Basophils Relative: 0 % (ref 0–1)
EOS ABS: 0.1 10*3/uL (ref 0.0–0.7)
EOS PCT: 1 % (ref 0–5)
HCT: 41.4 % (ref 39.0–52.0)
Hemoglobin: 14.2 g/dL (ref 13.0–17.0)
LYMPHS ABS: 2.1 10*3/uL (ref 0.7–4.0)
Lymphocytes Relative: 18 % (ref 12–46)
MCH: 32 pg (ref 26.0–34.0)
MCHC: 34.3 g/dL (ref 30.0–36.0)
MCV: 93.2 fL (ref 78.0–100.0)
Monocytes Absolute: 0.8 10*3/uL (ref 0.1–1.0)
Monocytes Relative: 7 % (ref 3–12)
Neutro Abs: 8.6 10*3/uL — ABNORMAL HIGH (ref 1.7–7.7)
Neutrophils Relative %: 74 % (ref 43–77)
PLATELETS: 210 10*3/uL (ref 150–400)
RBC: 4.44 MIL/uL (ref 4.22–5.81)
RDW: 13.2 % (ref 11.5–15.5)
WBC: 11.6 10*3/uL — ABNORMAL HIGH (ref 4.0–10.5)

## 2014-07-18 LAB — COMPREHENSIVE METABOLIC PANEL
ALT: 66 U/L — ABNORMAL HIGH (ref 17–63)
ANION GAP: 11 (ref 5–15)
AST: 45 U/L — ABNORMAL HIGH (ref 15–41)
Albumin: 3.3 g/dL — ABNORMAL LOW (ref 3.5–5.0)
Alkaline Phosphatase: 90 U/L (ref 38–126)
BILIRUBIN TOTAL: 0.5 mg/dL (ref 0.3–1.2)
BUN: 56 mg/dL — AB (ref 6–20)
CHLORIDE: 108 mmol/L (ref 101–111)
CO2: 19 mmol/L — ABNORMAL LOW (ref 22–32)
Calcium: 9 mg/dL (ref 8.9–10.3)
Creatinine, Ser: 3.55 mg/dL — ABNORMAL HIGH (ref 0.61–1.24)
GFR calc Af Amer: 20 mL/min — ABNORMAL LOW (ref >60)
GFR, EST NON AFRICAN AMERICAN: 18 mL/min — AB (ref >60)
GLUCOSE: 109 mg/dL — AB (ref 65–99)
POTASSIUM: 4.7 mmol/L (ref 3.5–5.1)
SODIUM: 138 mmol/L (ref 135–145)
TOTAL PROTEIN: 6.9 g/dL (ref 6.5–8.1)

## 2014-07-18 LAB — I-STAT TROPONIN, ED: TROPONIN I, POC: 0 ng/mL (ref 0.00–0.08)

## 2014-07-18 LAB — TSH: TSH: 1.392 u[IU]/mL (ref 0.350–4.500)

## 2014-07-18 LAB — BRAIN NATRIURETIC PEPTIDE: B Natriuretic Peptide: 382.7 pg/mL — ABNORMAL HIGH (ref 0.0–100.0)

## 2014-07-18 LAB — CBG MONITORING, ED: GLUCOSE-CAPILLARY: 110 mg/dL — AB (ref 65–99)

## 2014-07-18 MED ORDER — FEBUXOSTAT 40 MG PO TABS
40.0000 mg | ORAL_TABLET | Freq: Every day | ORAL | Status: DC
  Administered 2014-07-19: 40 mg via ORAL
  Filled 2014-07-18: qty 1

## 2014-07-18 MED ORDER — DILTIAZEM HCL 100 MG IV SOLR
5.0000 mg/h | INTRAVENOUS | Status: DC
  Administered 2014-07-18: 10 mg/h via INTRAVENOUS
  Administered 2014-07-18: 5 mg/h via INTRAVENOUS

## 2014-07-18 MED ORDER — SPIRONOLACTONE 50 MG PO TABS
50.0000 mg | ORAL_TABLET | Freq: Every day | ORAL | Status: DC
  Administered 2014-07-19: 50 mg via ORAL
  Filled 2014-07-18: qty 2
  Filled 2014-07-18: qty 1

## 2014-07-18 MED ORDER — ATORVASTATIN CALCIUM 40 MG PO TABS: 40.0000 mg | ORAL_TABLET | Freq: Every day | ORAL | Status: DC

## 2014-07-18 MED ORDER — OMEGA-3-ACID ETHYL ESTERS 1 G PO CAPS
1.0000 g | ORAL_CAPSULE | Freq: Every day | ORAL | Status: DC
  Administered 2014-07-19: 1 g via ORAL
  Filled 2014-07-18: qty 1

## 2014-07-18 MED ORDER — HYDROCHLOROTHIAZIDE 25 MG PO TABS
25.0000 mg | ORAL_TABLET | Freq: Every day | ORAL | Status: DC
  Administered 2014-07-19: 25 mg via ORAL
  Filled 2014-07-18: qty 1

## 2014-07-18 MED ORDER — ASPIRIN EC 81 MG PO TBEC
81.0000 mg | DELAYED_RELEASE_TABLET | Freq: Every day | ORAL | Status: DC
  Administered 2014-07-18 – 2014-07-19 (×2): 81 mg via ORAL
  Filled 2014-07-18 (×2): qty 1

## 2014-07-18 MED ORDER — INSULIN ASPART 100 UNIT/ML ~~LOC~~ SOLN
0.0000 [IU] | Freq: Every day | SUBCUTANEOUS | Status: DC
Start: 2014-07-18 — End: 2014-07-19

## 2014-07-18 MED ORDER — COLCHICINE 0.6 MG PO TABS
0.6000 mg | ORAL_TABLET | Freq: Every day | ORAL | Status: DC
  Administered 2014-07-18 – 2014-07-19 (×2): 0.6 mg via ORAL
  Filled 2014-07-18 (×2): qty 1

## 2014-07-18 MED ORDER — TETANUS-DIPHTHERIA TOXOIDS TD 5-2 LFU IM INJ
0.5000 mL | INJECTION | Freq: Once | INTRAMUSCULAR | Status: AC
Start: 2014-07-18 — End: 2014-07-18
  Administered 2014-07-18: 0.5 mL via INTRAMUSCULAR
  Filled 2014-07-18: qty 0.5

## 2014-07-18 MED ORDER — METOPROLOL TARTRATE 25 MG PO TABS
25.0000 mg | ORAL_TABLET | Freq: Two times a day (BID) | ORAL | Status: DC
  Administered 2014-07-18: 25 mg via ORAL
  Filled 2014-07-18: qty 1

## 2014-07-18 MED ORDER — ONDANSETRON HCL 4 MG/2ML IJ SOLN: 4.0000 mg | Freq: Four times a day (QID) | INTRAMUSCULAR | Status: DC | PRN

## 2014-07-18 MED ORDER — METOPROLOL TARTRATE 1 MG/ML IV SOLN: 2.5000 mg | INTRAVENOUS | Status: AC

## 2014-07-18 MED ORDER — INSULIN ASPART 100 UNIT/ML ~~LOC~~ SOLN: 0.0000 [IU] | Freq: Three times a day (TID) | SUBCUTANEOUS | Status: DC

## 2014-07-18 MED ORDER — ACETAMINOPHEN 325 MG PO TABS
650.0000 mg | ORAL_TABLET | ORAL | Status: DC | PRN
Start: 2014-07-18 — End: 2014-07-19
  Administered 2014-07-19: 650 mg via ORAL
  Filled 2014-07-18: qty 2

## 2014-07-18 MED ORDER — HEPARIN (PORCINE) IN NACL 100-0.45 UNIT/ML-% IJ SOLN
1500.0000 [IU]/h | INTRAMUSCULAR | Status: DC
  Administered 2014-07-18: 1500 [IU]/h via INTRAVENOUS
  Filled 2014-07-18: qty 250

## 2014-07-18 MED ORDER — DILTIAZEM LOAD VIA INFUSION
10.0000 mg | Freq: Once | INTRAVENOUS | Status: AC
  Administered 2014-07-18: 10 mg via INTRAVENOUS
  Filled 2014-07-18: qty 10
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
## 2014-07-18 NOTE — Progress Notes (Signed)
ANTICOAGULATION CONSULT NOTE - Initial Consult  Pharmacy Consult for heparin Indication: atrial fibrillation  No Known Allergies  Patient Measurements:   Heparin Dosing Weight: 103 kg  Vital Signs: Temp: 98.9 F (37.2 C) (07/10 1457) Temp Source: Oral (07/10 1457) BP: 149/103 mmHg (07/10 1815) Pulse Rate: 65 (07/10 1900)  Labs:  Recent Labs  07/18/14 1621  HGB 14.2  HCT 41.4  PLT 210  CREATININE 3.55*    CrCl cannot be calculated (Unknown ideal weight.).   Medical History: Past Medical History  Diagnosis Date  . Gout   . Hypertension   . Renal disorder     Medications:  Scheduled:  . aspirin EC  81 mg Oral Daily  . [START ON 07/19/2014] atorvastatin  40 mg Oral q1800  . colchicine  0.6 mg Oral Daily  . febuxostat  40 mg Oral Daily  . hydrochlorothiazide  25 mg Oral Daily  . [START ON 07/19/2014] insulin aspart  0-20 Units Subcutaneous TID WC  . insulin aspart  0-5 Units Subcutaneous QHS  . metoprolol  2.5 mg Intravenous Q5 min  . metoprolol tartrate  25 mg Oral BID  . omega-3 acid ethyl esters  1 g Oral Daily  . spironolactone  50 mg Oral Daily   Infusions:  . diltiazem (CARDIZEM) infusion Stopped (07/18/14 1759)    Assessment: 59 yo male with new onset afib will be started on heparin.  Patient was not on any anticoagulants at home.  Hgb 14.2 and Plt 210 K.  SCr 3.55  Goal of Therapy:  Heparin level 0.3-0.7 units/ml Monitor platelets by anticoagulation protocol: Yes   Plan:  - Start heparin at 1500 units/hr. No bolus - 8 hr heparin level - daily heparin level and CBC  Javeion Cannedy, [place] 07/18/2014,8:45 PM
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
## 2014-07-18 NOTE — ED Notes (Signed)
UPDATED PT CONTACT INFO; ADDED SISTER IN LAW SAM #'S TO CONTACTS; WIFE WILL BE WITH SISTER IN LAW FOR THE NIGHT IF NEED TO BE CONTACTED

## 2014-07-19 ENCOUNTER — Inpatient Hospital Stay (HOSPITAL_COMMUNITY): Payer: BLUE CROSS/BLUE SHIELD

## 2014-07-19 ENCOUNTER — Encounter (HOSPITAL_COMMUNITY): Payer: Self-pay | Admitting: Physician Assistant

## 2014-07-19 ENCOUNTER — Telehealth: Payer: Self-pay | Admitting: Nurse Practitioner

## 2014-07-19 DIAGNOSIS — I509 Heart failure, unspecified: Secondary | ICD-10-CM

## 2014-07-19 DIAGNOSIS — I10 Essential (primary) hypertension: Secondary | ICD-10-CM

## 2014-07-19 DIAGNOSIS — E669 Obesity, unspecified: Secondary | ICD-10-CM | POA: Diagnosis present

## 2014-07-19 DIAGNOSIS — I480 Paroxysmal atrial fibrillation: Secondary | ICD-10-CM | POA: Diagnosis present

## 2014-07-19 LAB — BASIC METABOLIC PANEL
Anion gap: 9 (ref 5–15)
BUN: 54 mg/dL — ABNORMAL HIGH (ref 6–20)
CALCIUM: 9.1 mg/dL (ref 8.9–10.3)
CO2: 21 mmol/L — ABNORMAL LOW (ref 22–32)
Chloride: 109 mmol/L (ref 101–111)
Creatinine, Ser: 3.24 mg/dL — ABNORMAL HIGH (ref 0.61–1.24)
GFR calc Af Amer: 23 mL/min — ABNORMAL LOW (ref >60)
GFR calc non Af Amer: 20 mL/min — ABNORMAL LOW (ref >60)
Glucose, Bld: 109 mg/dL — ABNORMAL HIGH (ref 65–99)
Potassium: 4.8 mmol/L (ref 3.5–5.1)
Sodium: 139 mmol/L (ref 135–145)

## 2014-07-19 LAB — LIPID PANEL
CHOL/HDL RATIO: 4.7 ratio
CHOLESTEROL: 123 mg/dL (ref 0–200)
HDL: 26 mg/dL — ABNORMAL LOW (ref >40)
LDL Cholesterol: 64 mg/dL (ref 0–99)
TRIGLYCERIDES: 165 mg/dL — AB (ref <150)
VLDL: 33 mg/dL (ref 0–40)

## 2014-07-19 LAB — HEPARIN LEVEL (UNFRACTIONATED): Heparin Unfractionated: 0.45 IU/mL (ref 0.30–0.70)

## 2014-07-19 LAB — CBC
HEMATOCRIT: 40.4 % (ref 39.0–52.0)
HEMOGLOBIN: 14 g/dL (ref 13.0–17.0)
MCH: 32.1 pg (ref 26.0–34.0)
MCHC: 34.7 g/dL (ref 30.0–36.0)
MCV: 92.7 fL (ref 78.0–100.0)
PLATELETS: 200 10*3/uL (ref 150–400)
RBC: 4.36 MIL/uL (ref 4.22–5.81)
RDW: 13.2 % (ref 11.5–15.5)
WBC: 9.1 10*3/uL (ref 4.0–10.5)

## 2014-07-19 LAB — GLUCOSE, CAPILLARY
Glucose-Capillary: 104 mg/dL — ABNORMAL HIGH (ref 65–99)
Glucose-Capillary: 83 mg/dL (ref 65–99)

## 2014-07-19 MED ORDER — COLCHICINE 0.6 MG PO TABS: 0.3000 mg | ORAL_TABLET | Freq: Every day | ORAL | Status: DC

## 2014-07-19 MED ORDER — RIVAROXABAN 15 MG PO TABS: 15.0000 mg | ORAL_TABLET | Freq: Every day | ORAL | Status: DC

## 2014-07-19 MED ORDER — OFF THE BEAT BOOK
Freq: Once | Status: DC
  Filled 2014-07-19: qty 1

## 2014-07-19 MED ORDER — RIVAROXABAN 20 MG PO TABS
20.0000 mg | ORAL_TABLET | Freq: Every day | ORAL | Status: DC
  Administered 2014-07-19: 20 mg via ORAL
  Filled 2014-07-19: qty 1

## 2014-07-19 MED ORDER — VERAPAMIL HCL ER 180 MG PO TBCR
360.0000 mg | EXTENDED_RELEASE_TABLET | Freq: Every day | ORAL | Status: DC
  Administered 2014-07-19: 360 mg via ORAL
  Filled 2014-07-19: qty 2

## 2014-07-19 MED ORDER — PERFLUTREN LIPID MICROSPHERE
1.0000 mL | INTRAVENOUS | Status: AC | PRN
  Administered 2014-07-19: 2 mL via INTRAVENOUS
  Filled 2014-07-19: qty 10

## 2014-07-19 NOTE — Progress Notes (Signed)
Dr Oval Linsey made aware that on pt's arrival to 3W,  pt was found to be in AFib. At approximately  23: 20, pt had a 3.11 second pause and then  into NSR rate 65. I was at the bedside when pt converted  back in to NSR. Pt was in stable condition.

## 2014-07-19 NOTE — Progress Notes (Signed)
[place] for heparin Indication: atrial fibrillation  No Known Allergies  Patient Measurements: Height: 6\' 2"  (188 cm) Weight: 252 lb 13.9 oz (114.7 kg) IBW/kg (Calculated) : 82.2 Heparin Dosing Weight: 103 kg  Vital Signs: Temp: 98.9 F (37.2 C) (07/10 2219) Temp Source: Oral (07/10 2219) BP: 126/102 mmHg (07/11 0210) Pulse Rate: 114 (07/11 0210)  Labs:  Recent Labs  07/18/14 1621 07/19/14 0604  HGB 14.2 14.0  HCT 41.4 40.4  PLT 210 200  HEPARINUNFRC  --  0.45  CREATININE 3.55*  --     Estimated Creatinine Clearance: 30.5 mL/min (by C-G formula based on Cr of 3.55).  Assessment: 59 y.o. male with Afib for heparin  Goal of Therapy:  Heparin level 0.3-0.7 units/ml Monitor platelets by anticoagulation protocol: Yes   Plan:  Continue Heparin at current rate for now F/U plan for oral anticoagulation   Djon Tith, [place] Curb 07/19/2014,6:44 AM

## 2014-07-19 NOTE — Progress Notes (Signed)
  Echocardiogram 2D Echocardiogram with Definity has been performed.  Kevin James 07/19/2014, 10:30 AM
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
## 2014-07-19 NOTE — Telephone Encounter (Signed)
Date is 07/28/14
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
## 2014-07-19 NOTE — Care Management (Signed)
1633 Late Entry: CM did call [facility] in [place] to make sure xarelto is available and it is. CM was not able to get benefits check due to no insurance card on file. Per pt he has insurance. 30 day free card given to pt and discount card for year given to pt as well. No further needs from CM at this time. Bethena Roys, RN,BSN [phone]

## 2014-07-19 NOTE — Telephone Encounter (Signed)
New message      TCM appt on 07-10-14 per Dayna.

## 2014-07-19 NOTE — Progress Notes (Signed)
Page sent to Dr Oval Linsey regarding pt going in and out of Afib with pauses.

## 2014-07-19 NOTE — Progress Notes (Signed)
UR Completed Weslyn Holsonback Graves-Bigelow, RN,BSN [phone]  
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
## 2014-07-20 LAB — HEMOGLOBIN A1C
Hgb A1c MFr Bld: 6 % — ABNORMAL HIGH (ref 4.8–5.6)
Mean Plasma Glucose: 126 mg/dL

## 2014-07-20 NOTE — Telephone Encounter (Signed)
Patient contacted regarding discharge from  [facility] on 07/19/2014.  Patient understands to follow up with provider Ignacia Bayley NP on 07/20/2014 at 9:00 am at [address]. Patient understands discharge instructions? yes Patient understands medications and regiment? yes Patient understands to bring all medications to this visit? yes  Asked patient to be at office 15 minutes early to get checked in.

## 2014-07-28 ENCOUNTER — Ambulatory Visit (INDEPENDENT_AMBULATORY_CARE_PROVIDER_SITE_OTHER): Payer: BLUE CROSS/BLUE SHIELD | Admitting: Nurse Practitioner

## 2014-07-28 ENCOUNTER — Encounter: Payer: Self-pay | Admitting: Nurse Practitioner

## 2014-07-28 ENCOUNTER — Telehealth: Payer: Self-pay

## 2014-07-28 VITALS — BP 158/86 | HR 65 | Ht 74.0 in | Wt 251.4 lb

## 2014-07-28 DIAGNOSIS — E119 Type 2 diabetes mellitus without complications: Secondary | ICD-10-CM

## 2014-07-28 DIAGNOSIS — R945 Abnormal results of liver function studies: Secondary | ICD-10-CM

## 2014-07-28 DIAGNOSIS — R7989 Other specified abnormal findings of blood chemistry: Secondary | ICD-10-CM

## 2014-07-28 DIAGNOSIS — Z9989 Dependence on other enabling machines and devices: Secondary | ICD-10-CM

## 2014-07-28 DIAGNOSIS — E875 Hyperkalemia: Secondary | ICD-10-CM

## 2014-07-28 DIAGNOSIS — I480 Paroxysmal atrial fibrillation: Secondary | ICD-10-CM | POA: Diagnosis not present

## 2014-07-28 DIAGNOSIS — N184 Chronic kidney disease, stage 4 (severe): Secondary | ICD-10-CM | POA: Diagnosis not present

## 2014-07-28 DIAGNOSIS — I10 Essential (primary) hypertension: Secondary | ICD-10-CM | POA: Diagnosis not present

## 2014-07-28 DIAGNOSIS — N186 End stage renal disease: Secondary | ICD-10-CM | POA: Insufficient documentation

## 2014-07-28 DIAGNOSIS — G4733 Obstructive sleep apnea (adult) (pediatric): Secondary | ICD-10-CM

## 2014-07-28 DIAGNOSIS — E1122 Type 2 diabetes mellitus with diabetic chronic kidney disease: Secondary | ICD-10-CM | POA: Insufficient documentation

## 2014-07-28 LAB — COMPREHENSIVE METABOLIC PANEL
ALK PHOS: 92 U/L (ref 39–117)
ALT: 44 U/L (ref 0–53)
AST: 35 U/L (ref 0–37)
Albumin: 3.8 g/dL (ref 3.5–5.2)
BUN: 64 mg/dL — AB (ref 6–23)
CO2: 21 mEq/L (ref 19–32)
Calcium: 9.2 mg/dL (ref 8.4–10.5)
Chloride: 105 mEq/L (ref 96–112)
Creatinine, Ser: 3.44 mg/dL — ABNORMAL HIGH (ref 0.40–1.50)
GFR: 19.6 mL/min — AB (ref >60.00)
Glucose, Bld: 84 mg/dL (ref 70–99)
POTASSIUM: 5.4 meq/L — AB (ref 3.5–5.1)
Sodium: 134 mEq/L — ABNORMAL LOW (ref 135–145)
Total Bilirubin: 0.4 mg/dL (ref 0.2–1.2)
Total Protein: 7 g/dL (ref 6.0–8.3)

## 2014-07-28 LAB — CBC WITH DIFFERENTIAL/PLATELET
BASOS ABS: 0.1 10*3/uL (ref 0.0–0.1)
BASOS PCT: 1.2 % (ref 0.0–3.0)
EOS PCT: 2.6 % (ref 0.0–5.0)
Eosinophils Absolute: 0.3 10*3/uL (ref 0.0–0.7)
HCT: 38.1 % — ABNORMAL LOW (ref 39.0–52.0)
Hemoglobin: 13.6 g/dL (ref 13.0–17.0)
LYMPHS ABS: 2.3 10*3/uL (ref 0.7–4.0)
Lymphocytes Relative: 21.2 % (ref 12.0–46.0)
MCHC: 35.8 g/dL (ref 30.0–36.0)
MCV: 92.9 fl (ref 78.0–100.0)
MONOS PCT: 6.6 % (ref 3.0–12.0)
Monocytes Absolute: 0.7 10*3/uL (ref 0.1–1.0)
NEUTROS ABS: 7.4 10*3/uL (ref 1.4–7.7)
Neutrophils Relative %: 68.4 % (ref 43.0–77.0)
PLATELETS: 341 10*3/uL (ref 150.0–400.0)
RBC: 4.1 Mil/uL — AB (ref 4.22–5.81)
RDW: 14.1 % (ref 11.5–15.5)
WBC: 10.8 10*3/uL — ABNORMAL HIGH (ref 4.0–10.5)

## 2014-07-28 NOTE — Patient Instructions (Signed)
Medication Instructions:  Your physician recommends that you continue on your current medications as directed. Please refer to the Current Medication list given to you today.   Labwork: Cbc, Cmet Today   Testing/Procedures: None   Follow-Up: Your physician recommends that you schedule a follow-up appointment in: 3 months with Dr.Ross  Any Other Special Instructions Will Be Listed Below (If Applicable).
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
## 2014-07-28 NOTE — Telephone Encounter (Signed)
Called patient with lab results. Per Ignacia Bayley, NP, Bun/creat relatively stable but K is up. Please have him reduce his spironolactone to 25mg  daily. He should have a f/u bmet on Friday. Patient verbalized understanding. Lab work ordered and schedule for this Friday.

## 2014-07-30 ENCOUNTER — Other Ambulatory Visit (INDEPENDENT_AMBULATORY_CARE_PROVIDER_SITE_OTHER): Payer: BLUE CROSS/BLUE SHIELD | Admitting: *Deleted

## 2014-07-30 DIAGNOSIS — E875 Hyperkalemia: Secondary | ICD-10-CM

## 2014-07-30 LAB — BASIC METABOLIC PANEL
BUN: 56 mg/dL — ABNORMAL HIGH (ref 6–23)
CALCIUM: 9.2 mg/dL (ref 8.4–10.5)
CHLORIDE: 102 meq/L (ref 96–112)
CO2: 26 mEq/L (ref 19–32)
Creatinine, Ser: 3.38 mg/dL — ABNORMAL HIGH (ref 0.40–1.50)
GFR: 20 mL/min — ABNORMAL LOW (ref >60.00)
GLUCOSE: 194 mg/dL — AB (ref 70–99)
POTASSIUM: 4.8 meq/L (ref 3.5–5.1)
Sodium: 136 mEq/L (ref 135–145)

## 2014-09-23 ENCOUNTER — Ambulatory Visit (INDEPENDENT_AMBULATORY_CARE_PROVIDER_SITE_OTHER): Payer: BLUE CROSS/BLUE SHIELD | Admitting: Neurology

## 2014-09-23 ENCOUNTER — Encounter: Payer: Self-pay | Admitting: Neurology

## 2014-09-23 VITALS — BP 148/90 | HR 88 | Resp 20 | Ht 72.0 in | Wt 254.0 lb

## 2014-09-23 DIAGNOSIS — I480 Paroxysmal atrial fibrillation: Secondary | ICD-10-CM | POA: Diagnosis not present

## 2014-09-23 DIAGNOSIS — G4719 Other hypersomnia: Secondary | ICD-10-CM | POA: Diagnosis not present

## 2014-09-23 DIAGNOSIS — G4733 Obstructive sleep apnea (adult) (pediatric): Secondary | ICD-10-CM

## 2014-09-23 DIAGNOSIS — R351 Nocturia: Secondary | ICD-10-CM

## 2014-09-23 DIAGNOSIS — E669 Obesity, unspecified: Secondary | ICD-10-CM
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
## 2014-09-23 NOTE — Patient Instructions (Signed)
You have obstructive sleep apnea or OSA, and I think we should proceed with a sleep study to determine how severe it is. If you have more than mild OSA, I want you to consider treatment with CPAP. Please remember, the risks and ramifications of moderate to severe obstructive sleep apnea or OSA are: Cardiovascular disease, including congestive heart failure, stroke, difficult to control hypertension, arrhythmias, and even type 2 diabetes has been linked to untreated OSA. Sleep apnea causes disruption of sleep and sleep deprivation in most cases, which, in turn, can cause recurrent headaches, problems with memory, mood, concentration, focus, and vigilance. Most people with untreated sleep apnea report excessive daytime sleepiness, which can affect their ability to drive. Please do not drive if you feel sleepy.   I will likely see you back after your sleep study to go over the test results and where to go from there. We will call you after your sleep study to advise about the results (most likely, you will hear from Beverlee Nims, my nurse) and to set up an appointment at the time, as necessary.    Our sleep lab administrative assistant, Arrie Aran will meet with you or call you to schedule your sleep study. If you don't hear back from her by next week please feel free to call her at [phone]. This is her direct line and please leave a message with your phone number to call back if you get the voicemail box. She will call back as soon as possible.   We will hopefully be able to get you a new machine and supplies as well.

## 2014-10-25 ENCOUNTER — Ambulatory Visit (INDEPENDENT_AMBULATORY_CARE_PROVIDER_SITE_OTHER): Payer: BLUE CROSS/BLUE SHIELD | Admitting: Neurology

## 2014-10-25 DIAGNOSIS — G4761 Periodic limb movement disorder: Secondary | ICD-10-CM

## 2014-10-25 DIAGNOSIS — R9431 Abnormal electrocardiogram [ECG] [EKG]: Secondary | ICD-10-CM

## 2014-10-25 DIAGNOSIS — G4733 Obstructive sleep apnea (adult) (pediatric): Secondary | ICD-10-CM

## 2014-10-25 DIAGNOSIS — G4720 Circadian rhythm sleep disorder, unspecified type: Secondary | ICD-10-CM

## 2014-10-25 DIAGNOSIS — G479 Sleep disorder, unspecified: Secondary | ICD-10-CM

## 2014-10-25 DIAGNOSIS — G4731 Primary central sleep apnea: Secondary | ICD-10-CM

## 2014-10-25 NOTE — Sleep Study (Signed)
Please see the scanned sleep study interpretation located in the Procedure tab within the Chart Review section. 

## 2014-10-29 ENCOUNTER — Telehealth: Payer: Self-pay | Admitting: Neurology

## 2014-10-29 DIAGNOSIS — G4733 Obstructive sleep apnea (adult) (pediatric): Secondary | ICD-10-CM

## 2014-10-29 DIAGNOSIS — G4720 Circadian rhythm sleep disorder, unspecified type: Secondary | ICD-10-CM

## 2014-10-29 DIAGNOSIS — R9431 Abnormal electrocardiogram [ECG] [EKG]: Secondary | ICD-10-CM

## 2014-10-29 DIAGNOSIS — G4761 Periodic limb movement disorder: Secondary | ICD-10-CM

## 2014-10-29 DIAGNOSIS — G479 Sleep disorder, unspecified: Secondary | ICD-10-CM

## 2014-10-29 DIAGNOSIS — G4731 Primary central sleep apnea: Secondary | ICD-10-CM

## 2014-10-29 NOTE — Telephone Encounter (Signed)
Patient referred by Dr. Joylene Draft, seen by me on 09/23/14, diagnostic PSG on 10/25/14, ins: [employer].   Please call and notify the patient that the recent sleep study did confirm the diagnosis of severe central and obstructive sleep apnea and that I recommend treatment for this in the form of CPAP (or BiPAP). This will require a repeat sleep study for proper titration and mask fitting. Unfortunately, he slept very little during the baseline sleep study, which is why we were unable to use CPAP at the time (sleep efficiency was 10%!). Please explain to patient and arrange for a CPAP titration study. I have placed an order in the chart. Thanks, and please route to [facility] for scheduling next sleep study.  Star Age, MD, PhD [facility])

## 2014-11-01 NOTE — Telephone Encounter (Signed)
I spoke to patient and he is aware of results and recommendation. He is willing to proceed with treatment. I will fax report to PCP.

## 2014-11-15 ENCOUNTER — Ambulatory Visit (INDEPENDENT_AMBULATORY_CARE_PROVIDER_SITE_OTHER): Payer: BLUE CROSS/BLUE SHIELD | Admitting: Neurology

## 2014-11-15 DIAGNOSIS — G4733 Obstructive sleep apnea (adult) (pediatric): Secondary | ICD-10-CM

## 2014-11-15 DIAGNOSIS — G4731 Primary central sleep apnea: Secondary | ICD-10-CM

## 2014-11-15 DIAGNOSIS — G479 Sleep disorder, unspecified: Secondary | ICD-10-CM

## 2014-11-15 DIAGNOSIS — G4761 Periodic limb movement disorder: Secondary | ICD-10-CM

## 2014-11-15 DIAGNOSIS — R9431 Abnormal electrocardiogram [ECG] [EKG]: Secondary | ICD-10-CM

## 2014-11-15 NOTE — Sleep Study (Signed)
Please see the scanned sleep study interpretation located in the Procedure tab within the Chart Review section. 

## 2014-11-19 ENCOUNTER — Ambulatory Visit (INDEPENDENT_AMBULATORY_CARE_PROVIDER_SITE_OTHER): Payer: BLUE CROSS/BLUE SHIELD | Admitting: Internal Medicine

## 2014-11-19 ENCOUNTER — Encounter: Payer: Self-pay | Admitting: Internal Medicine

## 2014-11-19 ENCOUNTER — Telehealth: Payer: Self-pay | Admitting: Neurology

## 2014-11-19 VITALS — BP 140/82 | HR 63 | Ht 74.0 in | Wt 254.1 lb

## 2014-11-19 DIAGNOSIS — I480 Paroxysmal atrial fibrillation: Secondary | ICD-10-CM | POA: Diagnosis not present

## 2014-11-19 DIAGNOSIS — G4733 Obstructive sleep apnea (adult) (pediatric): Secondary | ICD-10-CM

## 2014-11-19 DIAGNOSIS — I10 Essential (primary) hypertension: Secondary | ICD-10-CM

## 2014-11-19 DIAGNOSIS — N189 Chronic kidney disease, unspecified: Secondary | ICD-10-CM | POA: Diagnosis not present

## 2014-11-19 NOTE — Telephone Encounter (Signed)
Patient referred by Dr. Joylene Draft, seen by me on 09/23/14, diagnostic PSG on 10/25/14, CPAP study on 11/16/14, ins: [employer].  Please call and inform patient that I have entered an order for treatment with positive airway pressure (PAP) treatment of obstructive sleep apnea (OSA). He did well during the latest sleep study with CPAP. We will, therefore, arrange for a machine for home use through a DME (durable medical equipment) company of His choice; and I will see the patient back in follow-up in about 8-10 weeks. Please also explain to the patient that I will be looking out for compliance data, which can be downloaded from the machine (stored on an SD card, that is inserted in the machine) or via remote access through a modem, that is built into the machine. At the time of the followup appointment we will discuss sleep study results and how it is going with PAP treatment at home. Please advise patient to bring His machine at the time of the first FU visit, even though this is cumbersome. Bringing the machine for every visit after that will likely not be needed, but often helps for the first visit to troubleshoot if needed. Please re-enforce the importance of compliance with treatment and the need for [place] to monitor compliance data - often an insurance requirement and actually good feedback for the patient as far as how they are doing.  Also remind patient, that any interim PAP machine or mask issues should be first addressed with the DME company, as they can often help better with technical and mask fit issues. Please ask if patient has a preference regarding DME company.  Please also make sure, the patient has a follow-up appointment with me in about 8-10 weeks from the setup date, thanks.  Once you have spoken to the patient - and faxed/routed report to PCP and referring MD (if other than PCP), you can close this encounter, thanks,   Star Age, MD, PhD [facility] ([place])
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
## 2014-11-19 NOTE — Patient Instructions (Signed)
Your physician wants you to follow-up in: April 2017 WITH DR Theressa Stamps will receive a reminder letter in the mail two months in advance. If you don't receive a letter, please call our office to schedule the follow-up appointment.

## 2014-11-22 ENCOUNTER — Encounter: Payer: Self-pay | Admitting: Internal Medicine

## 2014-11-22 NOTE — Telephone Encounter (Signed)
I spoke to wife and she will relay the results and recommendation. Per wife we will go ahead and send referral to [facility]. Patient is currently using a CPAP machine that is 58 years old. I will fax results to PCP. I will also send patient a letter reminding him to make f/u appt and stress importance of compliance.

## 2014-11-23 ENCOUNTER — Encounter: Payer: Self-pay | Admitting: Internal Medicine

## 2014-11-24 ENCOUNTER — Telehealth: Payer: Self-pay

## 2014-11-24 NOTE — Telephone Encounter (Signed)
Returned call to patient from voicemail received. Pt unaware of why he received a message to call the office. Writer checked notes, asked operators and schedulers unable to determine if patient was called and/or why.  Georgana Curio MHA RN CCM

## 2014-12-14 ENCOUNTER — Other Ambulatory Visit: Payer: Self-pay | Admitting: Internal Medicine

## 2014-12-14 MED ORDER — RIVAROXABAN 15 MG PO TABS: 15.0000 mg | ORAL_TABLET | Freq: Every day | ORAL | Status: DC

## 2015-01-26 ENCOUNTER — Encounter: Payer: Self-pay | Admitting: Neurology

## 2015-01-26 ENCOUNTER — Ambulatory Visit (INDEPENDENT_AMBULATORY_CARE_PROVIDER_SITE_OTHER): Payer: BLUE CROSS/BLUE SHIELD | Admitting: Neurology

## 2015-01-26 VITALS — BP 168/88 | HR 78 | Resp 20 | Ht 74.0 in | Wt 254.0 lb

## 2015-01-26 DIAGNOSIS — E669 Obesity, unspecified: Secondary | ICD-10-CM | POA: Diagnosis not present

## 2015-01-26 DIAGNOSIS — G4733 Obstructive sleep apnea (adult) (pediatric): Secondary | ICD-10-CM | POA: Diagnosis not present

## 2015-01-26 DIAGNOSIS — G4761 Periodic limb movement disorder: Secondary | ICD-10-CM | POA: Diagnosis not present

## 2015-01-26 DIAGNOSIS — Z9989 Dependence on other enabling machines and devices: Principal | ICD-10-CM
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
## 2015-01-26 NOTE — Patient Instructions (Addendum)
Please continue using your CPAP regularly. While your insurance requires that you use CPAP at least 4 hours each night on 70% of the nights, I recommend, that you not skip any nights and use it throughout the night if you can. Getting used to CPAP and staying with the treatment long term does take time and patience and discipline. Untreated obstructive sleep apnea when it is moderate to severe can have an adverse impact on cardiovascular health and raise her risk for heart disease, arrhythmias, hypertension, congestive heart failure, stroke and diabetes. Untreated obstructive sleep apnea causes sleep disruption, nonrestorative sleep, and sleep deprivation. This can have an impact on your day to day functioning and cause daytime sleepiness and impairment of cognitive function, memory loss, mood disturbance, and problems focussing. Using CPAP regularly can improve these symptoms.  Keep up the good work! I will see you back in 6 months for sleep apnea check up, and if you continue to do well on CPAP I will see you once a year thereafter.   We will increase your CPAP pressure to 9 cm.

## 2015-02-17 ENCOUNTER — Other Ambulatory Visit: Payer: Self-pay | Admitting: Nephrology

## 2015-02-17 DIAGNOSIS — N183 Chronic kidney disease, stage 3 unspecified: Secondary | ICD-10-CM

## 2015-02-23 ENCOUNTER — Ambulatory Visit
Admission: RE | Admit: 2015-02-23 | Discharge: 2015-02-23 | Disposition: A | Discharge status: Home / Self Care | Payer: BLUE CROSS/BLUE SHIELD | Source: Ambulatory Visit | Attending: Nephrology | Admitting: Nephrology

## 2015-02-23 DIAGNOSIS — N183 Chronic kidney disease, stage 3 unspecified: Secondary | ICD-10-CM

## 2015-04-25 ENCOUNTER — Ambulatory Visit (INDEPENDENT_AMBULATORY_CARE_PROVIDER_SITE_OTHER): Payer: BLUE CROSS/BLUE SHIELD | Admitting: Internal Medicine

## 2015-04-25 ENCOUNTER — Encounter: Payer: Self-pay | Admitting: Internal Medicine

## 2015-04-25 VITALS — BP 154/84 | HR 64 | Ht 74.0 in | Wt 246.2 lb

## 2015-04-25 DIAGNOSIS — I480 Paroxysmal atrial fibrillation: Secondary | ICD-10-CM | POA: Diagnosis not present

## 2015-04-25 DIAGNOSIS — I10 Essential (primary) hypertension: Secondary | ICD-10-CM | POA: Diagnosis not present

## 2015-04-25 NOTE — Patient Instructions (Signed)
Medication Instructions:  Your physician recommends that you continue on your current medications as directed. Please refer to the Current Medication list given to you today.   Labwork: We will request your recent lab work from your nephrologist Dr.[place]  Testing/Procedures: None ordered  Follow-Up: Your physician wants you to follow-up in: 6 months with Dr.Ross You will receive a reminder letter in the mail two months in advance. If you don't receive a letter, please call our office to schedule the follow-up appointment.   Any Other Special Instructions Will Be Listed Below (If Applicable).     If you need a refill on your cardiac medications before your next appointment, please call your pharmacy.
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
## 2015-04-26 ENCOUNTER — Telehealth: Payer: Self-pay

## 2015-04-26 NOTE — Telephone Encounter (Signed)
Pt recent lab work requested from [facility]. Pt had labs in March 2017, they will fax over results attn: Dr.Ross

## 2015-05-23 ENCOUNTER — Encounter: Payer: Self-pay | Admitting: Vascular Surgery

## 2015-05-23 ENCOUNTER — Other Ambulatory Visit: Payer: Self-pay

## 2015-05-23 DIAGNOSIS — N185 Chronic kidney disease, stage 5: Secondary | ICD-10-CM

## 2015-05-23 DIAGNOSIS — Z01810 Encounter for preprocedural cardiovascular examination: Secondary | ICD-10-CM

## 2015-05-24 ENCOUNTER — Ambulatory Visit (INDEPENDENT_AMBULATORY_CARE_PROVIDER_SITE_OTHER)
Admission: RE | Admit: 2015-05-24 | Discharge: 2015-05-24 | Disposition: A | Discharge status: Home / Self Care | Payer: BLUE CROSS/BLUE SHIELD | Source: Ambulatory Visit | Attending: Vascular Surgery | Admitting: Vascular Surgery

## 2015-05-24 ENCOUNTER — Ambulatory Visit (HOSPITAL_COMMUNITY)
Admission: RE | Admit: 2015-05-24 | Discharge: 2015-05-24 | Disposition: A | Discharge status: Home / Self Care | Payer: BLUE CROSS/BLUE SHIELD | Source: Ambulatory Visit | Attending: Vascular Surgery | Admitting: Vascular Surgery

## 2015-05-24 ENCOUNTER — Encounter: Payer: Self-pay | Admitting: Vascular Surgery

## 2015-05-24 ENCOUNTER — Ambulatory Visit (INDEPENDENT_AMBULATORY_CARE_PROVIDER_SITE_OTHER): Payer: BLUE CROSS/BLUE SHIELD | Admitting: Vascular Surgery

## 2015-05-24 ENCOUNTER — Other Ambulatory Visit: Payer: Self-pay

## 2015-05-24 VITALS — BP 151/83 | HR 53 | Temp 97.9°F | Resp 18 | Ht 73.0 in | Wt 240.0 lb

## 2015-05-24 DIAGNOSIS — N184 Chronic kidney disease, stage 4 (severe): Secondary | ICD-10-CM | POA: Diagnosis not present

## 2015-05-24 DIAGNOSIS — Z01810 Encounter for preprocedural cardiovascular examination: Secondary | ICD-10-CM | POA: Diagnosis not present

## 2015-05-24 DIAGNOSIS — N185 Chronic kidney disease, stage 5: Secondary | ICD-10-CM
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
## 2015-05-24 NOTE — Progress Notes (Signed)
Filed Vitals:   05/24/15 1003 05/24/15 1004  BP: 146/79 151/83  Pulse: 53 53  Temp: 97.9 F (36.6 C)   Resp: 18   Height: 6\' 1"  (1.854 m)   Weight: 240 lb (108.863 kg)   SpO2: 100%

## 2015-05-31 ENCOUNTER — Encounter (HOSPITAL_COMMUNITY): Payer: Self-pay | Admitting: *Deleted

## 2015-05-31 MED ORDER — SODIUM CHLORIDE 0.9 % IV SOLN
INTRAVENOUS | Status: DC
  Administered 2015-06-01: 08:00:00 via INTRAVENOUS

## 2015-05-31 MED ORDER — DEXTROSE 5 % IV SOLN
1.5000 g | INTRAVENOUS | Status: AC
  Administered 2015-06-01: 1.5 g via INTRAVENOUS
  Filled 2015-05-31: qty 1.5

## 2015-05-31 NOTE — Anesthesia Preprocedure Evaluation (Addendum)
Anesthesia Evaluation  Patient identified by MRN, date of birth, ID band Patient awake    Reviewed: Allergy & Precautions, NPO status , Patient's Chart, lab work & pertinent test results  History of Anesthesia Complications Negative for: history of anesthetic complications  Airway Mallampati: II  TM Distance: >3 FB Neck ROM: Full    Dental  (+) Teeth Intact, Poor Dentition, Dental Advisory Given   Pulmonary sleep apnea , former smoker,    breath sounds clear to auscultation       Cardiovascular hypertension,  Rhythm:Regular Rate:Normal     Neuro/Psych negative neurological ROS     GI/Hepatic negative GI ROS, Neg liver ROS,   Endo/Other  diabetes  Renal/GU CRFRenal disease     Musculoskeletal  (+) Arthritis ,   Abdominal (+) + obese,   Peds  Hematology negative hematology ROS (+)   Anesthesia Other Findings   Reproductive/Obstetrics                            Anesthesia Physical Anesthesia Plan  ASA: III  Anesthesia Plan: MAC   Post-op Pain Management:    Induction: Intravenous  Airway Management Planned: Simple Face Mask and Natural Airway  Additional Equipment:   Intra-op Plan:   Post-operative Plan:   Informed Consent: I have reviewed the patients History and Physical, chart, labs and discussed the procedure including the risks, benefits and alternatives for the proposed anesthesia with the patient or authorized representative who has indicated his/her understanding and acceptance.     Plan Discussed with: Surgeon and CRNA  Anesthesia Plan Comments:         Anesthesia Quick Evaluation

## 2015-05-31 NOTE — Progress Notes (Signed)
Anesthesia Chart Review:  Pt is a 60 year old male scheduled for L radiocephalic AV fistula creation on 06/01/2015 with Dr. Kellie Simmering.   Pt is a same day work up.   Cardiologist is Dr. Dorris Carnes, last office visit 04/25/15. PCP is Dr. Crist Infante. Nephrologist is Dr. Corliss Parish who referred pt for procedure.   PMH includes:  PAF, HTN, DM, CKD (stage IV), OSA. Former smoker. BMI 32  Medications include: lipitor, lasix, prednisone, xarelto, sitagliptin, spironolactone, verapamil. Xarelto was stopped 3 days prior to surgery.   Labs will be obtained DOS.  Most recent cr was 7.66 on 05/05/15.   1 view CXR 07/18/14: Cardiomegaly without evidence of acute cardiopulmonary disease  EKG 07/28/14: sinus rhythm with PACs  Echo 07/19/14:  - Left ventricle: The cavity size was normal. Wall thickness was increased in a pattern of mild LVH. Systolic function was normal. The estimated ejection fraction was in the range of 55% to 60%. Wall motion was normal; there were no regional wall motion abnormalities. Doppler parameters are consistent with abnormal left ventricular relaxation (grade 1 diastolic dysfunction). - Impressions: Technically difficult; definity used. Normal LV function; grade 1 diastolic dysfunction; trace TR.  If labs acceptable DOS, I anticipate pt can proceed as scheduled.   Willeen Cass, FNP-BC [facility]/Anesthesiology Phone: [phone] 05/31/2015 1:41 PM

## 2015-05-31 NOTE — Progress Notes (Signed)
Pt denies SOB and chest pain but is under the care of Dr. Harrington Challenger, Cardiology. Pt denies having a stress test and cardiac cath. Pt made aware to hold Januvia the morning of surgery. Pt stated that his last dose of Xarelto was Saturday as instructed by MD. Pt made aware to stop taking vitamins, fish oil and herbal medications. Do not take any NSAIDs ie: Ibuprofen, Advil, Naproxen, BC and Goody Powder. Pt verbalized understanding of all pre-op instructions. Anesthesia asked to review pt history.

## 2015-06-01 ENCOUNTER — Ambulatory Visit (HOSPITAL_COMMUNITY)
Admission: RE | Admit: 2015-06-01 | Discharge: 2015-06-01 | Disposition: A | Discharge status: Home / Self Care | Payer: BLUE CROSS/BLUE SHIELD | Source: Ambulatory Visit | Attending: Vascular Surgery | Admitting: Vascular Surgery

## 2015-06-01 ENCOUNTER — Ambulatory Visit (HOSPITAL_COMMUNITY): Payer: BLUE CROSS/BLUE SHIELD | Admitting: Emergency Medicine

## 2015-06-01 ENCOUNTER — Other Ambulatory Visit: Payer: Self-pay | Admitting: *Deleted

## 2015-06-01 ENCOUNTER — Encounter (HOSPITAL_COMMUNITY): Payer: Self-pay | Admitting: *Deleted

## 2015-06-01 ENCOUNTER — Encounter (HOSPITAL_COMMUNITY)
Admission: RE | Disposition: A | Discharge status: Home / Self Care | Payer: Self-pay | Source: Ambulatory Visit | Attending: Vascular Surgery

## 2015-06-01 ENCOUNTER — Telehealth: Payer: Self-pay | Admitting: Vascular Surgery

## 2015-06-01 DIAGNOSIS — Z6832 Body mass index (BMI) 32.0-32.9, adult: Secondary | ICD-10-CM | POA: Insufficient documentation

## 2015-06-01 DIAGNOSIS — I480 Paroxysmal atrial fibrillation: Secondary | ICD-10-CM | POA: Insufficient documentation

## 2015-06-01 DIAGNOSIS — Z4931 Encounter for adequacy testing for hemodialysis: Secondary | ICD-10-CM

## 2015-06-01 DIAGNOSIS — Z7984 Long term (current) use of oral hypoglycemic drugs: Secondary | ICD-10-CM | POA: Insufficient documentation

## 2015-06-01 DIAGNOSIS — Z79899 Other long term (current) drug therapy: Secondary | ICD-10-CM | POA: Insufficient documentation

## 2015-06-01 DIAGNOSIS — Z7901 Long term (current) use of anticoagulants: Secondary | ICD-10-CM | POA: Diagnosis not present

## 2015-06-01 DIAGNOSIS — N189 Chronic kidney disease, unspecified: Secondary | ICD-10-CM | POA: Insufficient documentation

## 2015-06-01 DIAGNOSIS — G4733 Obstructive sleep apnea (adult) (pediatric): Secondary | ICD-10-CM | POA: Diagnosis not present

## 2015-06-01 DIAGNOSIS — Z87891 Personal history of nicotine dependence: Secondary | ICD-10-CM | POA: Diagnosis not present

## 2015-06-01 DIAGNOSIS — E669 Obesity, unspecified: Secondary | ICD-10-CM | POA: Diagnosis not present

## 2015-06-01 DIAGNOSIS — N2581 Secondary hyperparathyroidism of renal origin: Secondary | ICD-10-CM | POA: Insufficient documentation

## 2015-06-01 DIAGNOSIS — N184 Chronic kidney disease, stage 4 (severe): Secondary | ICD-10-CM | POA: Diagnosis not present

## 2015-06-01 DIAGNOSIS — I129 Hypertensive chronic kidney disease with stage 1 through stage 4 chronic kidney disease, or unspecified chronic kidney disease: Secondary | ICD-10-CM | POA: Insufficient documentation

## 2015-06-01 DIAGNOSIS — N186 End stage renal disease: Secondary | ICD-10-CM

## 2015-06-01 DIAGNOSIS — E1022 Type 1 diabetes mellitus with diabetic chronic kidney disease: Secondary | ICD-10-CM | POA: Insufficient documentation

## 2015-06-01 DIAGNOSIS — M109 Gout, unspecified: Secondary | ICD-10-CM | POA: Insufficient documentation

## 2015-06-01 HISTORY — DX: Pneumonia, unspecified organism: J18.9

## 2015-06-01 HISTORY — PX: AV FISTULA PLACEMENT: SHX1204

## 2015-06-01 LAB — GLUCOSE, CAPILLARY
GLUCOSE-CAPILLARY: 87 mg/dL (ref 65–99)
GLUCOSE-CAPILLARY: 85 mg/dL (ref 65–99)

## 2015-06-01 LAB — POCT I-STAT 4, (NA,K, GLUC, HGB,HCT)
Glucose, Bld: 87 mg/dL (ref 65–99)
HEMATOCRIT: 26 % — AB (ref 39.0–52.0)
HEMOGLOBIN: 8.8 g/dL — AB (ref 13.0–17.0)
POTASSIUM: 5.7 mmol/L — AB (ref 3.5–5.1)
Sodium: 143 mmol/L (ref 135–145)

## 2015-06-01 SURGERY — ARTERIOVENOUS (AV) FISTULA CREATION
Anesthesia: Monitor Anesthesia Care | Site: Arm Lower | Laterality: Left

## 2015-06-01 MED ORDER — MIDAZOLAM HCL 5 MG/5ML IJ SOLN
INTRAMUSCULAR | Status: DC | PRN
  Administered 2015-06-01: 1 mg via INTRAVENOUS

## 2015-06-01 MED ORDER — EPHEDRINE SULFATE 50 MG/ML IJ SOLN
INTRAMUSCULAR | Status: DC | PRN
  Administered 2015-06-01 (×2): 10 mg via INTRAVENOUS
  Administered 2015-06-01: 5 mg via INTRAVENOUS

## 2015-06-01 MED ORDER — MIDAZOLAM HCL 2 MG/2ML IJ SOLN
INTRAMUSCULAR | Status: AC
  Filled 2015-06-01: qty 2

## 2015-06-01 MED ORDER — OXYCODONE-ACETAMINOPHEN 5-325 MG PO TABS: 1.0000 | ORAL_TABLET | Freq: Four times a day (QID) | ORAL | Status: DC | PRN

## 2015-06-01 MED ORDER — GLYCOPYRROLATE 0.2 MG/ML IJ SOLN
INTRAMUSCULAR | Status: DC | PRN
Start: 2015-06-01 — End: 2015-06-01
  Administered 2015-06-01: 0.1 mg via INTRAVENOUS

## 2015-06-01 MED ORDER — CHLORHEXIDINE GLUCONATE CLOTH 2 % EX PADS: 6.0000 | MEDICATED_PAD | Freq: Once | CUTANEOUS | Status: DC

## 2015-06-01 MED ORDER — LIDOCAINE HCL (CARDIAC) 20 MG/ML IV SOLN: INTRAVENOUS | Status: DC | PRN

## 2015-06-01 MED ORDER — SODIUM CHLORIDE 0.9 % IV SOLN
INTRAVENOUS | Status: DC | PRN
  Administered 2015-06-01: 500 mL

## 2015-06-01 MED ORDER — PHENYLEPHRINE HCL 10 MG/ML IJ SOLN
10.0000 mg | INTRAVENOUS | Status: DC | PRN
  Administered 2015-06-01: 5 ug/min via INTRAVENOUS

## 2015-06-01 MED ORDER — LIDOCAINE 2% (20 MG/ML) 5 ML SYRINGE
INTRAMUSCULAR | Status: DC | PRN
  Administered 2015-06-01: 40 mg via INTRAVENOUS

## 2015-06-01 MED ORDER — 0.9 % SODIUM CHLORIDE (POUR BTL) OPTIME
TOPICAL | Status: DC | PRN
  Administered 2015-06-01: 1000 mL

## 2015-06-01 MED ORDER — LIDOCAINE HCL (PF) 1 % IJ SOLN
INTRAMUSCULAR | Status: AC
  Filled 2015-06-01: qty 30

## 2015-06-01 MED ORDER — HYDROMORPHONE HCL 1 MG/ML IJ SOLN: 0.2500 mg | INTRAMUSCULAR | Status: DC | PRN

## 2015-06-01 MED ORDER — PROPOFOL 500 MG/50ML IV EMUL
INTRAVENOUS | Status: DC | PRN
  Administered 2015-06-01: 100 ug/kg/min via INTRAVENOUS

## 2015-06-01 MED ORDER — FENTANYL CITRATE (PF) 250 MCG/5ML IJ SOLN
INTRAMUSCULAR | Status: AC
  Filled 2015-06-01: qty 5

## 2015-06-01 MED ORDER — PROPOFOL 10 MG/ML IV BOLUS
INTRAVENOUS | Status: AC
  Filled 2015-06-01: qty 20

## 2015-06-01 MED ORDER — LIDOCAINE HCL (PF) 1 % IJ SOLN
INTRAMUSCULAR | Status: DC | PRN
  Administered 2015-06-01: 10 mL via INTRADERMAL

## 2015-06-01 MED ORDER — FENTANYL CITRATE (PF) 100 MCG/2ML IJ SOLN
INTRAMUSCULAR | Status: DC | PRN
  Administered 2015-06-01 (×2): 50 ug via INTRAVENOUS

## 2015-06-01 SURGICAL SUPPLY — 30 items
ARMBAND PINK RESTRICT EXTREMIT (MISCELLANEOUS) ×3 IMPLANT
CANISTER SUCTION 2500CC (MISCELLANEOUS) ×3 IMPLANT
CLIP TI MEDIUM 6 (CLIP) ×3 IMPLANT
CLIP TI WIDE RED SMALL 6 (CLIP) ×5 IMPLANT
COVER PROBE W GEL 5X96 (DRAPES) IMPLANT
DECANTER SPIKE VIAL GLASS SM (MISCELLANEOUS) ×2 IMPLANT
DRAIN PENROSE 1/2X12 LTX STRL (WOUND CARE) ×3 IMPLANT
ELECT REM PT RETURN 9FT ADLT (ELECTROSURGICAL) ×3
ELECTRODE REM PT RTRN 9FT ADLT (ELECTROSURGICAL) ×1 IMPLANT
GEL ULTRASOUND 20GR AQUASONIC (MISCELLANEOUS) IMPLANT
GLOVE BIOGEL PI IND STRL 6.5 (GLOVE) IMPLANT
GLOVE BIOGEL PI IND STRL 7.5 (GLOVE) IMPLANT
GLOVE BIOGEL PI INDICATOR 6.5 (GLOVE) ×6
GLOVE BIOGEL PI INDICATOR 7.5 (GLOVE) ×2
GLOVE ECLIPSE 7.0 STRL STRAW (GLOVE) ×2 IMPLANT
GLOVE SS BIOGEL STRL SZ 7 (GLOVE) ×1 IMPLANT
GLOVE SUPERSENSE BIOGEL SZ 7 (GLOVE) ×2
GOWN STRL REUS W/ TWL LRG LVL3 (GOWN DISPOSABLE) ×3 IMPLANT
GOWN STRL REUS W/TWL LRG LVL3 (GOWN DISPOSABLE) ×6
KIT BASIN OR (CUSTOM PROCEDURE TRAY) ×3 IMPLANT
KIT ROOM TURNOVER OR (KITS) ×3 IMPLANT
LIQUID BAND (GAUZE/BANDAGES/DRESSINGS) ×3 IMPLANT
NS IRRIG 1000ML POUR BTL (IV SOLUTION) ×3 IMPLANT
PACK CV ACCESS (CUSTOM PROCEDURE TRAY) ×3 IMPLANT
PAD ARMBOARD 7.5X6 YLW CONV (MISCELLANEOUS) ×6 IMPLANT
SUT PROLENE 6 0 BV (SUTURE) ×3 IMPLANT
SUT VIC AB 3-0 SH 27 (SUTURE) ×3
SUT VIC AB 3-0 SH 27X BRD (SUTURE) ×1 IMPLANT
UNDERPAD 30X30 INCONTINENT (UNDERPADS AND DIAPERS) ×3 IMPLANT
WATER STERILE IRR 1000ML POUR (IV SOLUTION) ×1 IMPLANT

## 2015-06-01 NOTE — Transfer of Care (Signed)
Immediate Anesthesia Transfer of Care Note  Patient: Kevin James  Procedure(s) Performed: Procedure(s): LEFT ARM RADIOCEPHALIC ARTERIOVENOUS (AV) FISTULA CREATION (Left)  Patient Location: PACU  Anesthesia Type:MAC  Level of Consciousness: awake, alert , oriented and patient cooperative  Airway & Oxygen Therapy: Patient Spontanous Breathing and Patient connected to nasal cannula oxygen  Post-op Assessment: Report given to RN and Post -op Vital signs reviewed and stable  Post vital signs: Reviewed and stable  Last Vitals:  Filed Vitals:   06/01/15 0632 06/01/15 1000  BP: 185/77   Pulse: 57 58  Temp: 37.6 C 36.8 C  Resp: 18     Last Pain: There were no vitals filed for this visit.    Patients Stated Pain Goal: 6 ([id number])  Complications: No apparent anesthesia complications

## 2015-06-01 NOTE — Telephone Encounter (Signed)
-----   Message from Mena Goes, RN sent at 06/01/2015 10:41 AM EDT ----- Regarding: schedule   ----- Message -----    From: Ulyses Amor, PA-C    Sent: 06/01/2015   9:45 AM      To: Vvs Charge Pool  F/U with Dr. Kellie Simmering at the end of June with duplex of av fistula left forearm
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
## 2015-06-01 NOTE — Interval H&P Note (Signed)
History and Physical Interval Note:  06/01/2015 8:26 AM  Kevin James  has presented today for surgery, with the diagnosis of Stage IV Chronic Kidney Disease N18.4  The various methods of treatment have been discussed with the patient and family. After consideration of risks, benefits and other options for treatment, the patient has consented to  Procedure(s): RADIOCEPHALIC ARTERIOVENOUS (AV) FISTULA CREATION (Left) as a surgical intervention .  The patient's history has been reviewed, patient examined, no change in status, stable for surgery.  I have reviewed the patient's chart and labs.  Questions were answered to the patient's satisfaction.     [name]

## 2015-06-01 NOTE — Anesthesia Postprocedure Evaluation (Signed)
Anesthesia Post Note  Patient: Kevin James  Procedure(s) Performed: Procedure(s) (LRB): LEFT ARM RADIOCEPHALIC ARTERIOVENOUS (AV) FISTULA CREATION (Left)  Patient location during evaluation: PACU Anesthesia Type: MAC Level of consciousness: awake and alert Pain management: pain level controlled Vital Signs Assessment: post-procedure vital signs reviewed and stable Respiratory status: spontaneous breathing, nonlabored ventilation, respiratory function stable and patient connected to nasal cannula oxygen Cardiovascular status: stable and blood pressure returned to baseline Anesthetic complications: no    Last Vitals:  Filed Vitals:   06/01/15 1031 06/01/15 1045  BP: 132/76 156/77  Pulse: 52 57  Temp:    Resp: 18 20    Last Pain:  Filed Vitals:   06/01/15 1046  PainSc: 0-No pain                 Zamora Colton,JAMES TERRILL

## 2015-06-01 NOTE — Telephone Encounter (Signed)
Sched lab 6/19 at 4 and MD at 6/27 at 12:45. Spoke to pt's wife to inform them of appts.

## 2015-06-01 NOTE — Op Note (Signed)
OPERATIVE REPORT  Date of Surgery: 06/01/2015  Surgeon: [name], MD  Assistant: Gerri Lins PA  Pre-op Diagnosis: Stage IV Chronic Kidney Disease N18.4  Post-op Diagnosis: Stage IV Chronic Kidney Disease N18.4  Procedure: Procedure(s): LEFT ARM RADIOCEPHALIC ARTERIOVENOUS (AV) FISTULA CREATION  Anesthesia: Mac  EBL: Minimal  Complications: None  Procedure Details: The patient was taken the operative placed in supine position at which time left upper extremity was imaged using B mode ultrasound-sono site.  That the cephalic vein in the forearm was satisfactory for fistula creation. There was no cephalic vein in the upper arm there was a large basilic vein. After prepping and draping routine sterile manner and infiltration function Xylocaine with epinephrine short longitudinal incision was made proximal the wrist cephalic vein dissected free. Its branches ligated with 3 and 4-0 silk ties and divided. It was ligated distally transected gently dilated with heparinized saline it was about a 3 mm vein  Which was patent up to the antecubital area. Radial artery was exposedbeneath fascia encircled Vesseloops. It was a 2-1/2 mm vessel that was free of disease had a good pulse. It was encircled with Vesseloops proximally and distally open 15 blade extended with Potts scissors. 2-1/2 dilator would easily passed proximally and distally and there was excellent inflow. Vein was carefully measured spatulated and anastomosed inside with 6-0 Prolene. Following completion of this and appropriate flushing the Vesseloops released there was good pulse and thrill in the fistula with excellent Doppler flow up to the antecubital area. Adequate hemostasis was achieved wound closed in layers with Vicryl in subcuticular fashion    [name], MD 06/01/2015 10:00 AM

## 2015-06-02 ENCOUNTER — Encounter (HOSPITAL_COMMUNITY): Payer: Self-pay | Admitting: Vascular Surgery

## 2015-06-13 ENCOUNTER — Other Ambulatory Visit: Payer: Self-pay

## 2015-06-13 MED ORDER — RIVAROXABAN 15 MG PO TABS: 15.0000 mg | ORAL_TABLET | Freq: Every day | ORAL | Status: DC

## 2015-06-22 ENCOUNTER — Encounter: Payer: Self-pay | Admitting: Gastroenterology

## 2015-06-27 ENCOUNTER — Ambulatory Visit (HOSPITAL_COMMUNITY)
Admission: RE | Admit: 2015-06-27 | Discharge: 2015-06-27 | Disposition: A | Discharge status: Home / Self Care | Payer: BLUE CROSS/BLUE SHIELD | Source: Ambulatory Visit | Attending: Vascular Surgery | Admitting: Vascular Surgery

## 2015-06-27 DIAGNOSIS — Z4931 Encounter for adequacy testing for hemodialysis: Secondary | ICD-10-CM

## 2015-06-27 DIAGNOSIS — E1122 Type 2 diabetes mellitus with diabetic chronic kidney disease: Secondary | ICD-10-CM | POA: Diagnosis not present

## 2015-06-27 DIAGNOSIS — G4733 Obstructive sleep apnea (adult) (pediatric): Secondary | ICD-10-CM | POA: Insufficient documentation

## 2015-06-27 DIAGNOSIS — I120 Hypertensive chronic kidney disease with stage 5 chronic kidney disease or end stage renal disease: Secondary | ICD-10-CM | POA: Diagnosis not present

## 2015-06-27 DIAGNOSIS — N186 End stage renal disease: Secondary | ICD-10-CM

## 2015-06-29 ENCOUNTER — Encounter: Payer: Self-pay | Admitting: Vascular Surgery

## 2015-07-05 ENCOUNTER — Ambulatory Visit (INDEPENDENT_AMBULATORY_CARE_PROVIDER_SITE_OTHER): Payer: Self-pay | Admitting: Vascular Surgery

## 2015-07-05 ENCOUNTER — Encounter: Payer: Self-pay | Admitting: Vascular Surgery

## 2015-07-05 VITALS — BP 159/76 | HR 64 | Temp 97.2°F | Resp 18 | Ht 74.0 in | Wt 242.0 lb

## 2015-07-05 DIAGNOSIS — N184 Chronic kidney disease, stage 4 (severe): Secondary | ICD-10-CM

## 2015-07-05 NOTE — Progress Notes (Signed)
Subjective:     Patient ID: Kevin James, male   DOB: 1955/11/18, 60 y.o.   MRN: [id number]  HPI this 60 year old male with chronic kidney disease stage IV returns for initial follow-up regarding the left radial-cephalic AV fistula created on 06/01/2015. Patient is followed by Dr. Vanetta Mulders. He is due to see her July 25. He denies any significant pain in the left hand. He will occasionally have a tingling sensation at the base of the left thumb which is transient. He has never been on hemodialysis. Review of Systems     Objective:   Physical Exam BP 159/76 mmHg  Pulse 64  Temp(Src) 97.2 F (36.2 C)  Resp 18  Ht 6\' 2"  (1.88 m)  Wt 242 lb (109.77 kg)  BMI 31.06 kg/m2  SpO2 100%  Gen. alert and oriented 3 no apparent distress Left upper extremity with well-healed distal forearm incision and good pulse and palpable thrill and radial-cephalic AV fistula. Left and well perfused with 2+ radial pulse     Assessment:     Patient with chronic kidney disease stage IV-nicely functioning left radial-cephalic AV fistula    Plan:     Okay to use left radial-cephalic AV fistula after 09/01/2015 Return to see [place] on when necessary basis

## 2015-07-05 NOTE — Progress Notes (Signed)
Filed Vitals:   07/05/15 1224 07/05/15 1226  BP: 163/77 159/76  Pulse: 64 64  Temp: 97.2 F (36.2 C)   Resp: 18   Height: 6\' 2"  (1.88 m)   Weight: 242 lb (109.77 kg)   SpO2: 100%

## 2015-07-22 ENCOUNTER — Emergency Department (HOSPITAL_COMMUNITY): Payer: BLUE CROSS/BLUE SHIELD

## 2015-07-22 ENCOUNTER — Encounter (HOSPITAL_COMMUNITY): Payer: Self-pay | Admitting: Emergency Medicine

## 2015-07-22 ENCOUNTER — Inpatient Hospital Stay (HOSPITAL_COMMUNITY)
Admission: EM | Admit: 2015-07-22 | Discharge: 2015-07-27 | DRG: 682 | Disposition: A | Discharge status: Home / Self Care | Payer: BLUE CROSS/BLUE SHIELD | Attending: Internal Medicine | Admitting: Internal Medicine

## 2015-07-22 DIAGNOSIS — N186 End stage renal disease: Secondary | ICD-10-CM | POA: Diagnosis present

## 2015-07-22 DIAGNOSIS — E1122 Type 2 diabetes mellitus with diabetic chronic kidney disease: Secondary | ICD-10-CM

## 2015-07-22 DIAGNOSIS — N185 Chronic kidney disease, stage 5: Secondary | ICD-10-CM | POA: Diagnosis not present

## 2015-07-22 DIAGNOSIS — Z9989 Dependence on other enabling machines and devices: Secondary | ICD-10-CM

## 2015-07-22 DIAGNOSIS — Z6831 Body mass index (BMI) 31.0-31.9, adult: Secondary | ICD-10-CM | POA: Diagnosis not present

## 2015-07-22 DIAGNOSIS — N189 Chronic kidney disease, unspecified: Secondary | ICD-10-CM

## 2015-07-22 DIAGNOSIS — E785 Hyperlipidemia, unspecified: Secondary | ICD-10-CM

## 2015-07-22 DIAGNOSIS — R627 Adult failure to thrive: Secondary | ICD-10-CM | POA: Diagnosis present

## 2015-07-22 DIAGNOSIS — Z7682 Awaiting organ transplant status: Secondary | ICD-10-CM

## 2015-07-22 DIAGNOSIS — E669 Obesity, unspecified: Secondary | ICD-10-CM | POA: Diagnosis present

## 2015-07-22 DIAGNOSIS — I120 Hypertensive chronic kidney disease with stage 5 chronic kidney disease or end stage renal disease: Secondary | ICD-10-CM | POA: Diagnosis present

## 2015-07-22 DIAGNOSIS — G4733 Obstructive sleep apnea (adult) (pediatric): Secondary | ICD-10-CM

## 2015-07-22 DIAGNOSIS — Z794 Long term (current) use of insulin: Secondary | ICD-10-CM | POA: Diagnosis not present

## 2015-07-22 DIAGNOSIS — Z7901 Long term (current) use of anticoagulants: Secondary | ICD-10-CM | POA: Diagnosis not present

## 2015-07-22 DIAGNOSIS — R7989 Other specified abnormal findings of blood chemistry: Secondary | ICD-10-CM

## 2015-07-22 DIAGNOSIS — D649 Anemia, unspecified: Secondary | ICD-10-CM | POA: Diagnosis present

## 2015-07-22 DIAGNOSIS — M1990 Unspecified osteoarthritis, unspecified site: Secondary | ICD-10-CM | POA: Diagnosis present

## 2015-07-22 DIAGNOSIS — N179 Acute kidney failure, unspecified: Secondary | ICD-10-CM | POA: Diagnosis present

## 2015-07-22 DIAGNOSIS — E875 Hyperkalemia: Secondary | ICD-10-CM | POA: Diagnosis present

## 2015-07-22 DIAGNOSIS — I480 Paroxysmal atrial fibrillation: Secondary | ICD-10-CM | POA: Diagnosis present

## 2015-07-22 DIAGNOSIS — E1121 Type 2 diabetes mellitus with diabetic nephropathy: Secondary | ICD-10-CM | POA: Diagnosis present

## 2015-07-22 DIAGNOSIS — M109 Gout, unspecified: Secondary | ICD-10-CM | POA: Diagnosis present

## 2015-07-22 DIAGNOSIS — Z419 Encounter for procedure for purposes other than remedying health state, unspecified: Secondary | ICD-10-CM

## 2015-07-22 DIAGNOSIS — Z87891 Personal history of nicotine dependence: Secondary | ICD-10-CM

## 2015-07-22 DIAGNOSIS — R630 Anorexia: Secondary | ICD-10-CM | POA: Diagnosis present

## 2015-07-22 DIAGNOSIS — E119 Type 2 diabetes mellitus without complications: Secondary | ICD-10-CM

## 2015-07-22 DIAGNOSIS — D638 Anemia in other chronic diseases classified elsewhere: Secondary | ICD-10-CM | POA: Diagnosis present

## 2015-07-22 DIAGNOSIS — D631 Anemia in chronic kidney disease: Secondary | ICD-10-CM | POA: Diagnosis present

## 2015-07-22 DIAGNOSIS — R799 Abnormal finding of blood chemistry, unspecified: Secondary | ICD-10-CM

## 2015-07-22 DIAGNOSIS — R079 Chest pain, unspecified: Secondary | ICD-10-CM | POA: Diagnosis present

## 2015-07-22 DIAGNOSIS — I10 Essential (primary) hypertension: Secondary | ICD-10-CM | POA: Diagnosis not present

## 2015-07-22 HISTORY — DX: Chronic kidney disease, unspecified: N17.9

## 2015-07-22 HISTORY — DX: Obstructive sleep apnea (adult) (pediatric): G47.33

## 2015-07-22 HISTORY — DX: Hyperlipidemia, unspecified: E78.5

## 2015-07-22 LAB — BASIC METABOLIC PANEL
Anion gap: 14 (ref 5–15)
BUN: 112 mg/dL — AB (ref 6–20)
CHLORIDE: 109 mmol/L (ref 101–111)
CO2: 16 mmol/L — AB (ref 22–32)
CREATININE: 8.52 mg/dL — AB (ref 0.61–1.24)
Calcium: 9 mg/dL (ref 8.9–10.3)
GFR calc Af Amer: 7 mL/min — ABNORMAL LOW (ref >60)
GFR calc non Af Amer: 6 mL/min — ABNORMAL LOW (ref >60)
Glucose, Bld: 106 mg/dL — ABNORMAL HIGH (ref 65–99)
POTASSIUM: 4.6 mmol/L (ref 3.5–5.1)
Sodium: 139 mmol/L (ref 135–145)

## 2015-07-22 LAB — CBC
HEMATOCRIT: 24 % — AB (ref 39.0–52.0)
Hemoglobin: 7.6 g/dL — ABNORMAL LOW (ref 13.0–17.0)
MCH: 29.7 pg (ref 26.0–34.0)
MCHC: 31.7 g/dL (ref 30.0–36.0)
MCV: 93.8 fL (ref 78.0–100.0)
PLATELETS: 226 10*3/uL (ref 150–400)
RBC: 2.56 MIL/uL — ABNORMAL LOW (ref 4.22–5.81)
RDW: 14 % (ref 11.5–15.5)
WBC: 7.8 10*3/uL (ref 4.0–10.5)

## 2015-07-22 LAB — I-STAT TROPONIN, ED: Troponin i, poc: 0.03 ng/mL (ref 0.00–0.08)

## 2015-07-22 LAB — GLUCOSE, CAPILLARY: GLUCOSE-CAPILLARY: 118 mg/dL — AB (ref 65–99)

## 2015-07-22 LAB — PREPARE RBC (CROSSMATCH)

## 2015-07-22 LAB — ABO/RH: ABO/RH(D): O POS

## 2015-07-22 MED ORDER — SODIUM CHLORIDE 0.9 % IV SOLN
Freq: Once | INTRAVENOUS | Status: AC
  Administered 2015-07-23: 01:00:00 via INTRAVENOUS

## 2015-07-22 MED ORDER — SODIUM CHLORIDE 0.9% FLUSH
3.0000 mL | Freq: Two times a day (BID) | INTRAVENOUS | Status: DC
  Administered 2015-07-23 – 2015-07-27 (×10): 3 mL via INTRAVENOUS

## 2015-07-22 MED ORDER — LINAGLIPTIN 5 MG PO TABS: 5.0000 mg | ORAL_TABLET | Freq: Every day | ORAL | Status: DC

## 2015-07-22 MED ORDER — ONDANSETRON HCL 4 MG/2ML IJ SOLN: 4.0000 mg | Freq: Four times a day (QID) | INTRAMUSCULAR | Status: DC | PRN

## 2015-07-22 MED ORDER — ONDANSETRON HCL 4 MG PO TABS: 4.0000 mg | ORAL_TABLET | Freq: Four times a day (QID) | ORAL | Status: DC | PRN

## 2015-07-22 MED ORDER — METOPROLOL TARTRATE 5 MG/5ML IV SOLN
5.0000 mg | INTRAVENOUS | Status: DC | PRN
  Filled 2015-07-22: qty 5

## 2015-07-22 MED ORDER — INSULIN ASPART 100 UNIT/ML ~~LOC~~ SOLN
0.0000 [IU] | Freq: Three times a day (TID) | SUBCUTANEOUS | Status: DC
  Administered 2015-07-26 (×2): 3 [IU] via SUBCUTANEOUS

## 2015-07-22 MED ORDER — VERAPAMIL HCL ER 180 MG PO TBCR
360.0000 mg | EXTENDED_RELEASE_TABLET | Freq: Every day | ORAL | Status: DC
  Administered 2015-07-23 – 2015-07-27 (×5): 360 mg via ORAL
  Filled 2015-07-22 (×5): qty 2

## 2015-07-22 MED ORDER — ACETAMINOPHEN ER 650 MG PO TBCR: 1300.0000 mg | EXTENDED_RELEASE_TABLET | Freq: Three times a day (TID) | ORAL | Status: DC | PRN

## 2015-07-22 MED ORDER — ATORVASTATIN CALCIUM 80 MG PO TABS
80.0000 mg | ORAL_TABLET | Freq: Every day | ORAL | Status: DC
  Administered 2015-07-23 – 2015-07-27 (×6): 80 mg via ORAL
  Filled 2015-07-22 (×6): qty 1

## 2015-07-22 MED ORDER — ACETAMINOPHEN 650 MG RE SUPP: 650.0000 mg | Freq: Four times a day (QID) | RECTAL | Status: DC | PRN

## 2015-07-22 MED ORDER — COLCHICINE 0.6 MG PO TABS
0.6000 mg | ORAL_TABLET | Freq: Every day | ORAL | Status: DC
  Administered 2015-07-23 – 2015-07-24 (×2): 0.6 mg via ORAL
  Filled 2015-07-22 (×2): qty 1

## 2015-07-22 MED ORDER — OXYCODONE-ACETAMINOPHEN 5-325 MG PO TABS
1.0000 | ORAL_TABLET | Freq: Four times a day (QID) | ORAL | Status: DC | PRN
  Administered 2015-07-23 – 2015-07-26 (×6): 1 via ORAL
  Filled 2015-07-22 (×6): qty 1

## 2015-07-22 MED ORDER — DOCUSATE SODIUM 100 MG PO CAPS
100.0000 mg | ORAL_CAPSULE | Freq: Two times a day (BID) | ORAL | Status: DC
  Administered 2015-07-22 – 2015-07-27 (×10): 100 mg via ORAL
  Filled 2015-07-22 (×10): qty 1

## 2015-07-22 MED ORDER — ACETAMINOPHEN 325 MG PO TABS
650.0000 mg | ORAL_TABLET | Freq: Four times a day (QID) | ORAL | Status: DC | PRN
  Administered 2015-07-25: 650 mg via ORAL
  Filled 2015-07-22: qty 2

## 2015-07-22 MED ORDER — INSULIN ASPART 100 UNIT/ML ~~LOC~~ SOLN: 0.0000 [IU] | Freq: Every day | SUBCUTANEOUS | Status: DC

## 2015-07-22 NOTE — ED Notes (Signed)
Unable to obtain consent. MD has not done patient education regarding transfusions, receiving nurse notified.
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
## 2015-07-22 NOTE — ED Notes (Signed)
Per [place] [facility] patient from work after sudden onset of chest tightness.  Coworker stated the patient became very pale at onset of symptoms.  EMS states patient rated pain 3/10, after 1 SL nitro rated pain 1/10.  Patient also received 324 mg aspirin en route. EMS states coworkers believe patient has appeared much more pale since having a dialysis shunt placed in May of this year.

## 2015-07-22 NOTE — ED Notes (Signed)
Pt placed on cardiac monitor 

## 2015-07-23 ENCOUNTER — Encounter (HOSPITAL_COMMUNITY): Payer: Self-pay | Admitting: *Deleted

## 2015-07-23 DIAGNOSIS — I10 Essential (primary) hypertension: Secondary | ICD-10-CM

## 2015-07-23 DIAGNOSIS — E119 Type 2 diabetes mellitus without complications: Secondary | ICD-10-CM

## 2015-07-23 LAB — CREATININE, URINE, RANDOM: Creatinine, Urine: 40.87 mg/dL

## 2015-07-23 LAB — CBC
HEMATOCRIT: 24.1 % — AB (ref 39.0–52.0)
HEMOGLOBIN: 7.7 g/dL — AB (ref 13.0–17.0)
MCH: 29.3 pg (ref 26.0–34.0)
MCHC: 32 g/dL (ref 30.0–36.0)
MCV: 91.6 fL (ref 78.0–100.0)
Platelets: 215 10*3/uL (ref 150–400)
RBC: 2.63 MIL/uL — ABNORMAL LOW (ref 4.22–5.81)
RDW: 14.4 % (ref 11.5–15.5)
WBC: 8.6 10*3/uL (ref 4.0–10.5)

## 2015-07-23 LAB — URINALYSIS, ROUTINE W REFLEX MICROSCOPIC
Bilirubin Urine: NEGATIVE
GLUCOSE, UA: 100 mg/dL — AB
KETONES UR: NEGATIVE mg/dL
LEUKOCYTES UA: NEGATIVE
Nitrite: NEGATIVE
PH: 6 (ref 5.0–8.0)
Protein, ur: >300 mg/dL — AB
SPECIFIC GRAVITY, URINE: 1.013 (ref 1.005–1.030)

## 2015-07-23 LAB — URINE MICROSCOPIC-ADD ON

## 2015-07-23 LAB — BASIC METABOLIC PANEL
ANION GAP: 15 (ref 5–15)
BUN: 105 mg/dL — AB (ref 6–20)
CHLORIDE: 110 mmol/L (ref 101–111)
CO2: 16 mmol/L — ABNORMAL LOW (ref 22–32)
Calcium: 9.2 mg/dL (ref 8.9–10.3)
Creatinine, Ser: 8.41 mg/dL — ABNORMAL HIGH (ref 0.61–1.24)
GFR calc Af Amer: 7 mL/min — ABNORMAL LOW (ref >60)
GFR calc non Af Amer: 6 mL/min — ABNORMAL LOW (ref >60)
GLUCOSE: 86 mg/dL (ref 65–99)
POTASSIUM: 5.2 mmol/L — AB (ref 3.5–5.1)
Sodium: 141 mmol/L (ref 135–145)

## 2015-07-23 LAB — TROPONIN I
Troponin I: 0.03 ng/mL (ref <0.03)
Troponin I: 0.03 ng/mL (ref <0.03)
Troponin I: <0.03 ng/mL (ref <0.03)

## 2015-07-23 LAB — RETICULOCYTES
RBC.: 2.68 MIL/uL — AB (ref 4.22–5.81)
RETIC CT PCT: 3.3 % — AB (ref 0.4–3.1)
Retic Count, Absolute: 88.4 10*3/uL (ref 19.0–186.0)

## 2015-07-23 LAB — IRON AND TIBC
Iron: 147 ug/dL (ref 45–182)
Saturation Ratios: 44 % — ABNORMAL HIGH (ref 17.9–39.5)
TIBC: 336 ug/dL (ref 250–450)
UIBC: 189 ug/dL

## 2015-07-23 LAB — FOLATE: Folate: 17 ng/mL (ref >5.9)

## 2015-07-23 LAB — GLUCOSE, CAPILLARY
GLUCOSE-CAPILLARY: 96 mg/dL (ref 65–99)
Glucose-Capillary: 82 mg/dL (ref 65–99)
Glucose-Capillary: 87 mg/dL (ref 65–99)
Glucose-Capillary: 101 mg/dL — ABNORMAL HIGH (ref 65–99)

## 2015-07-23 LAB — SURGICAL PCR SCREEN
MRSA, PCR: NEGATIVE
STAPHYLOCOCCUS AUREUS: NEGATIVE

## 2015-07-23 LAB — FERRITIN: Ferritin: 12 ng/mL — ABNORMAL LOW (ref 24–336)

## 2015-07-23 LAB — PREPARE RBC (CROSSMATCH)

## 2015-07-23 LAB — VITAMIN B12: VITAMIN B 12: 584 pg/mL (ref 180–914)

## 2015-07-23 LAB — SODIUM, URINE, RANDOM: Sodium, Ur: 77 mmol/L

## 2015-07-23 MED ORDER — SODIUM POLYSTYRENE SULFONATE 15 GM/60ML PO SUSP
15.0000 g | Freq: Once | ORAL | Status: AC
  Administered 2015-07-23: 15 g via ORAL
  Filled 2015-07-23: qty 60

## 2015-07-23 MED ORDER — ENSURE ENLIVE PO LIQD: 237.0000 mL | Freq: Two times a day (BID) | ORAL | Status: DC

## 2015-07-23 MED ORDER — NEPRO/CARBSTEADY PO LIQD
237.0000 mL | ORAL | Status: DC
  Administered 2015-07-23: 237 mL via ORAL

## 2015-07-23 MED ORDER — DARBEPOETIN ALFA 100 MCG/0.5ML IJ SOSY
100.0000 ug | PREFILLED_SYRINGE | Freq: Once | INTRAMUSCULAR | Status: AC
  Administered 2015-07-23: 100 ug via SUBCUTANEOUS
  Filled 2015-07-23 (×2): qty 0.5

## 2015-07-23 MED ORDER — PNEUMOCOCCAL VAC POLYVALENT 25 MCG/0.5ML IJ INJ
0.5000 mL | INJECTION | INTRAMUSCULAR | Status: AC
  Administered 2015-07-23: 0.5 mL via INTRAMUSCULAR
  Filled 2015-07-23: qty 0.5

## 2015-07-23 MED ORDER — SODIUM CHLORIDE 0.9 % IV SOLN
Freq: Once | INTRAVENOUS | Status: AC
  Administered 2015-07-23: 12:00:00 via INTRAVENOUS

## 2015-07-23 NOTE — Progress Notes (Signed)
Initial Nutrition Assessment  DOCUMENTATION CODES:  Obesity unspecified  INTERVENTION:  Nepro Shake po q24 hrs, each supplement provides 425 kcal and 19 grams protein  Meal requests  Recommend renal vitamin of MD discretion given eating only 50% and not taking any vitamins prior  NUTRITION DIAGNOSIS:  Inadequate oral intake related to Taste Changes related to Chronic disease CKD5 as evidenced by reported meal completion < 50%.  GOAL:  Patient will meet greater than or equal to 90% of their needs  MONITOR:  PO intake, Supplement acceptance, Diet advancement, Labs, Weight trends  REASON FOR ASSESSMENT:  Malnutrition Screening Tool    ASSESSMENT:  60 y/o male PMhx gout, HTN, CKDIV, DM, PAF, OSA. He had AV fistula placed on 5/24, can be used 9/24. Presents with 1.5 weeks of weakness and feeling cold. Worked up for symptomatic anemia.    Spoke with Pt and his wife. They report that pt has had poor intake now for roughly a week. In addition to the fatigue, pt says he has been struggling with taste changes. To him, nothing tastes good. Wife quantified his intake as 50% of his normal. Pt states he follows a renal, low salt diet at home. He checks his BG and it is usually 95-115. Wife reports she bakes everything. Patient does not take any vitamins.   Denied n/v/c/d  RD stated that his taste changes likely are related to his renal function as this is a very common reported symptom is the population. RD stated that his taste may improve with dialysis.   RD discussed how a supplement may be appropriate given his reduced appetite and fatigue. He has never had any, but was willing to try one. When asked what food he prefers, he stated "chicken" prepared anyway. Will pass this to dietary.    Per MD notes, pt will initiate HD now. Energy/pro recs will reflect this.   Labs reviewed: Anemia, CKD, K: 5.2, CBGs 90-110. Recently.    Recent Labs Lab 07/22/15 1730 07/23/15 0512  NA 139 141  K  4.6 5.2*  CL 109 110  CO2 16* 16*  BUN 112* 105*  CREATININE 8.52* 8.41*  CALCIUM 9.0 9.2  GLUCOSE 106* 86  ;  Diet Order:  Diet renal/carb modified with fluid restriction Diet-HS Snack?: Nothing; Room service appropriate?: Yes; Fluid consistency:: Thin Diet NPO time specified Except for: Sips with Meds  Skin: Dry/pale  Last BM:  7/14  Height:  Ht Readings from Last 1 Encounters:  07/22/15 6\' 1"  (1.854 m)   Weight:  Wt Readings from Last 1 Encounters:  07/22/15 239 lb 12.8 oz (108.773 kg)   Wt Readings from Last 10 Encounters:  07/22/15 239 lb 12.8 oz (108.773 kg)  07/05/15 242 lb (109.77 kg)  06/01/15 240 lb (108.863 kg)  05/24/15 240 lb (108.863 kg)  04/25/15 246 lb 3.2 oz (111.676 kg)  01/26/15 254 lb (115.214 kg)  11/19/14 254 lb 1.9 oz (115.268 kg)  09/23/14 254 lb (115.214 kg)  07/28/14 251 lb 6.4 oz (114.034 kg)  07/18/14 252 lb 13.9 oz (114.7 kg)   Ideal Body Weight:  83.64 kg   Standard BW for pt height: 78 kg  BMI:  Body mass index is 31.64 kg/(m^2).  Estimated Nutritional Needs:  Kcal:  2200-2400 (28-31 kcal/kg sbw) Protein:  94-110 g (1.2-1.4 g/kg sbw) Fluid:  Per MD  EDUCATION NEEDS:  No education needs identified at this time  Burtis Junes RD, LDN, [place] [facility] Pager: [phone] 07/23/2015 7:00 PM

## 2015-07-23 NOTE — Progress Notes (Signed)
New Admission Note:   Arrival Method: Via stretcher from the ED Mental Orientation:  A & O x 4 Telemetry: Placed on Tele Box 6E07 Assessment: Completed Skin:  Pale but otherwise intact IV: RT FA NSL 20 gauge Pain: Denies Tubes: None Safety Measures: Safety Fall Prevention Plan has been given, discussed and signed Admission: Completed [facility] [place] Orientation: Patient has been orientated to the room, unit and staff.  Family:  From home with wife  Patient has a LLA AVF that was placed on 06/01/2015 and will be ready for use on 09/01/2015.  He and wife are wanting to do Home HD but wife's vision is poor and she is concerned that she will not be able to stick her husband's fistula.  They are also interested in doing peritoneal dialysis.  Orders have been reviewed and implemented. Will continue to monitor the patient. Call light has been placed within reach and bed alarm has been activated.   Earleen Reaper RN- [place], [place] Phone number: [phone]

## 2015-07-23 NOTE — Progress Notes (Signed)
PROGRESS NOTE    Kevin James  [id number] DOB: 09-14-1955 DOA: 07/22/2015 PCP: Jerlyn Ly, MD  Brief Narrative: This is a 60 year old male with a history of known history of chronic kidney disease. His AV fistula was placed by Dr. Kellie Simmering May 24, it can be used by August 24. He is also recently been placed on IV iron every 2 weeks. His hemoglobin has been trending down. Over the past week and a half he has been very weak and cold. He is having increasing shortness of breath. He finally came to ER and his hemoglobin is now 7.6. He denies any chest pains. He has never had a colonoscopy. He is on the renal transplant list. There is no evidence of any bleeding in his stool or urine  Assessment & Plan: Progressive CKD5 -has AVF, plan to start HD per Renal -a lot of symptoms of uremia -Renal following -xarelto on hold  Anemia  -progressive, Hb was 13.6 in 7/16 and 7.6 on admission -no overt blood loss noted, likely due to CKD -check Anemia panel and hemoccult -scheduled for Colonoscopy in Aug/2017 with [facility]  Obstructive sleep apnea on CPAP  -CPAP ordered  Hypertension  -Stable, continue verapamil -IV lopressor PRN  Paroxysmal atrial fibrillation  -Stable, continue verapamil -Xarelto held, will need to start Warfarin when able, d/w pt and spouse, will update Dr.Ross his cardiologist  Gout  -Continue colchicine  Diabetes mellitus  -hold Tradjenta, ADA diet, sliding-scale insulin  Dyslipidemia  -Stable, resume statin   Consultants:   Renal   Subjective: Feels weak, DoE, anorexia etc  Objective: Filed Vitals:   07/23/15 0137 07/23/15 0417 07/23/15 0524 07/23/15 1000  BP: 153/82 161/78 168/70 160/76  Pulse: 56 56 60 58  Temp: 98.1 F (36.7 C) 98 F (36.7 C) 98.3 F (36.8 C) 98.2 F (36.8 C)  TempSrc: Oral Oral Oral Oral  Resp: 20 16 17 18   Height:      Weight:      SpO2: 100% 96% 100% 98%    Intake/Output Summary (Last 24 hours) at 07/23/15  1105 Last data filed at 07/23/15 1057  Gross per 24 hour  Intake 1093.33 ml  Output      0 ml  Net 1093.33 ml   Filed Weights   07/22/15 2211  Weight: 108.773 kg (239 lb 12.8 oz)    Examination:  General exam: Appears calm and comfortable  Respiratory system: diminished BS at bases Cardiovascular system: S1 & S2 heard, RRR. No JVD, murmurs, rubs, gallops or clicks. No pedal edema. Gastrointestinal system: Abdomen is nondistended, soft and nontender. No organomegaly or masses felt. Normal bowel Central nervous system: Alert and oriented. No focal neurological deficits. Extremities: Symmetric 5 x 5 power. Skin: No rashes, lesions or ulcers Psychiatry: Judgement and insight appear normal. Mood & affect appropriate.     Data Reviewed: I have personally reviewed following labs and imaging studies  CBC:  Recent Labs Lab 07/22/15 1730 07/23/15 0512  WBC 7.8 8.6  HGB 7.6* 7.7*  HCT 24.0* 24.1*  MCV 93.8 91.6  PLT 226 [id number]   Basic Metabolic Panel:  Recent Labs Lab 07/22/15 1730 07/23/15 0512  NA 139 141  K 4.6 5.2*  CL 109 110  CO2 16* 16*  GLUCOSE 106* 86  BUN 112* 105*  CREATININE 8.52* 8.41*  CALCIUM 9.0 9.2   GFR: Estimated Creatinine Clearance: 12.2 mL/min (by C-G formula based on Cr of 8.41). Liver Function Tests: No results for input(s): AST, ALT, ALKPHOS,  BILITOT, PROT, ALBUMIN in the last 168 hours. No results for input(s): LIPASE, AMYLASE in the last 168 hours. No results for input(s): AMMONIA in the last 168 hours. Coagulation Profile: No results for input(s): INR, PROTIME in the last 168 hours. Cardiac Enzymes:  Recent Labs Lab 07/22/15 2319 07/23/15 0512  TROPONINI 0.03* 0.03*   BNP (last 3 results) No results for input(s): PROBNP in the last 8760 hours. HbA1C: No results for input(s): HGBA1C in the last 72 hours. CBG:  Recent Labs Lab 07/22/15 2258 07/23/15 0740  GLUCAP 118* 82   Lipid Profile: No results for input(s): CHOL, HDL,  LDLCALC, TRIG, CHOLHDL, LDLDIRECT in the last 72 hours. Thyroid Function Tests: No results for input(s): TSH, T4TOTAL, FREET4, T3FREE, THYROIDAB in the last 72 hours. Anemia Panel: No results for input(s): VITAMINB12, FOLATE, FERRITIN, TIBC, IRON, RETICCTPCT in the last 72 hours. Urine analysis: No results found for: COLORURINE, APPEARANCEUR, LABSPEC, PHURINE, GLUCOSEU, HGBUR, BILIRUBINUR, KETONESUR, PROTEINUR, UROBILINOGEN, NITRITE, LEUKOCYTESUR Sepsis Labs: @LABRCNTIP (procalcitonin:4,lacticidven:4)  )No results found for this or any previous visit (from the past 240 hour(s)).       Radiology Studies: Dg Chest 2 View  07/22/2015  CLINICAL DATA:  Acute chest pain and shortness of breath for several weeks. EXAM: CHEST  2 VIEW COMPARISON:  07/18/2014 and prior exams FINDINGS: Upper limits normal heart size and mild peribronchial thickening again noted. Thoracic aortic atherosclerotic calcifications again noted. There is no evidence of focal airspace disease, pulmonary edema, suspicious pulmonary nodule/mass, pleural effusion, or pneumothorax. No acute bony abnormalities are identified. IMPRESSION: No evidence of acute cardiopulmonary disease. Thoracic aortic atherosclerosis and mild chronic peribronchial thickening. Electronically Signed   By: Margarette Canada M.D.   On: 07/22/2015 18:42        Scheduled Meds: . sodium chloride   Intravenous Once  . atorvastatin  80 mg Oral q1800  . colchicine  0.6 mg Oral Daily  . docusate sodium  100 mg Oral BID  . feeding supplement (ENSURE ENLIVE)  237 mL Oral BID BM  . insulin aspart  0-15 Units Subcutaneous TID WC  . insulin aspart  0-5 Units Subcutaneous QHS  . linagliptin  5 mg Oral Daily  . pneumococcal 23 valent vaccine  0.5 mL Intramuscular Tomorrow-1000  . sodium chloride flush  3 mL Intravenous Q12H  . verapamil  360 mg Oral Q lunch   Continuous Infusions:    LOS: 1 day    Time spent: 81min    Domenic Polite, MD [facility] Pager [phone]  If 7PM-7AM, please contact night-coverage [email or web address] Password [email or web address] 07/23/2015, 11:05 AM
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
## 2015-07-23 NOTE — Anesthesia Preprocedure Evaluation (Addendum)
Anesthesia Evaluation  Patient identified by MRN, date of birth, ID band Patient awake    Reviewed: Allergy & Precautions, NPO status , Patient's Chart, lab work & pertinent test results  History of Anesthesia Complications Negative for: history of anesthetic complications  Airway Mallampati: III  TM Distance: >3 FB Neck ROM: Full    Dental  (+) Teeth Intact, Poor Dentition, Dental Advisory Given   Pulmonary sleep apnea , former smoker,    breath sounds clear to auscultation       Cardiovascular hypertension, Pt. on medications + dysrhythmias (PAF) Atrial Fibrillation  Rhythm:Regular Rate:Normal     Neuro/Psych negative neurological ROS  negative psych ROS   GI/Hepatic Neg liver ROS, GERD (active this AM)  ,  Endo/Other  diabetes, Type 2, Oral Hypoglycemic AgentsObesity   Renal/GU Dialysis and ESRFRenal disease     Musculoskeletal  (+) Arthritis ,   Abdominal (+) + obese,   Peds  Hematology  (+) Blood dyscrasia (Xarelto therapy), anemia ,   Anesthesia Other Findings   Reproductive/Obstetrics                            Anesthesia Physical Anesthesia Plan  ASA: III  Anesthesia Plan: General   Post-op Pain Management:    Induction: Intravenous  Airway Management Planned: Oral ETT  Additional Equipment:   Intra-op Plan:   Post-operative Plan: Extubation in OR  Informed Consent: I have reviewed the patients History and Physical, chart, labs and discussed the procedure including the risks, benefits and alternatives for the proposed anesthesia with the patient or authorized representative who has indicated his/her understanding and acceptance.   Dental advisory given  Plan Discussed with: CRNA  Anesthesia Plan Comments: (Risks/benefits of general anesthesia discussed with patient including risk of damage to teeth, lips, gum, and tongue, nausea/vomiting, allergic reactions to  medications, and the possibility of heart attack, stroke and death.  All patient questions answered.  Patient wishes to proceed.  Active GERD, use ETT.)       Anesthesia Quick Evaluation

## 2015-07-23 NOTE — Progress Notes (Addendum)
   Daily Progress Note   Pt had L RC AVF placement 06/01/15.  He was cleared for L RC AVF use by Dr. Kellie Simmering on 07/05/15.    - Would go ahead and attempt use of L RC AVF - If not successful, will go ahead with [facility] placement tomorrow   Adele Barthel, MD [facility] [place] Office: [phone] Pager: [phone]  07/23/2015, 10:21 AM   Addendum  L RC AVF somewhat difficult to palpate.  Would get access duplex to measure size and depth.  May need revision if too deep.  Subsequently, will go ahead and schedule for tomorrow for tunneled dialysis catheter.   The patient is aware the risks of tunneled dialysis catheter placement include but are not limited to: bleeding, infection, central venous injury, pneumothorax, possible venous stenosis, possible malpositioning in the venous system, and possible infections related to long-term catheter presence.   The patient was aware of these risks and agreed to proceed.   Adele Barthel, MD [facility] Office: [phone] Pager: [phone]  07/23/2015, 5:11 PM

## 2015-07-24 ENCOUNTER — Observation Stay (HOSPITAL_COMMUNITY): Payer: BLUE CROSS/BLUE SHIELD | Admitting: Anesthesiology

## 2015-07-24 ENCOUNTER — Observation Stay (HOSPITAL_COMMUNITY): Payer: BLUE CROSS/BLUE SHIELD

## 2015-07-24 ENCOUNTER — Encounter (HOSPITAL_COMMUNITY): Payer: BLUE CROSS/BLUE SHIELD

## 2015-07-24 ENCOUNTER — Encounter (HOSPITAL_COMMUNITY): Payer: Self-pay | Admitting: Anesthesiology

## 2015-07-24 ENCOUNTER — Encounter (HOSPITAL_COMMUNITY)
Admission: EM | Disposition: A | Discharge status: Home / Self Care | Payer: Self-pay | Source: Home / Self Care | Attending: Internal Medicine

## 2015-07-24 DIAGNOSIS — N185 Chronic kidney disease, stage 5: Secondary | ICD-10-CM

## 2015-07-24 HISTORY — PX: INSERTION OF DIALYSIS CATHETER: SHX1324

## 2015-07-24 LAB — RENAL FUNCTION PANEL
ALBUMIN: 2.8 g/dL — AB (ref 3.5–5.0)
ANION GAP: 12 (ref 5–15)
BUN: 105 mg/dL — AB (ref 6–20)
CHLORIDE: 110 mmol/L (ref 101–111)
CO2: 17 mmol/L — ABNORMAL LOW (ref 22–32)
Calcium: 8.6 mg/dL — ABNORMAL LOW (ref 8.9–10.3)
Creatinine, Ser: 7.83 mg/dL — ABNORMAL HIGH (ref 0.61–1.24)
GFR, EST AFRICAN AMERICAN: 8 mL/min — AB (ref >60)
GFR, EST NON AFRICAN AMERICAN: 7 mL/min — AB (ref >60)
Glucose, Bld: 74 mg/dL (ref 65–99)
PHOSPHORUS: 9.8 mg/dL — AB (ref 2.5–4.6)
POTASSIUM: 4.7 mmol/L (ref 3.5–5.1)
SODIUM: 139 mmol/L (ref 135–145)

## 2015-07-24 LAB — GLUCOSE, CAPILLARY
GLUCOSE-CAPILLARY: 90 mg/dL (ref 65–99)
GLUCOSE-CAPILLARY: 111 mg/dL — AB (ref 65–99)
GLUCOSE-CAPILLARY: 70 mg/dL (ref 65–99)
GLUCOSE-CAPILLARY: 142 mg/dL — AB (ref 65–99)
Glucose-Capillary: 122 mg/dL — ABNORMAL HIGH (ref 65–99)
Glucose-Capillary: 96 mg/dL (ref 65–99)

## 2015-07-24 LAB — PROTIME-INR
INR: 1.23 (ref 0.00–1.49)
Prothrombin Time: 15.7 seconds — ABNORMAL HIGH (ref 11.6–15.2)

## 2015-07-24 LAB — TYPE AND SCREEN
ABO/RH(D): O POS
ANTIBODY SCREEN: NEGATIVE
UNIT DIVISION: 0
Unit division: 0

## 2015-07-24 LAB — CBC
HEMATOCRIT: 27.5 % — AB (ref 39.0–52.0)
HEMOGLOBIN: 8.8 g/dL — AB (ref 13.0–17.0)
MCH: 29.1 pg (ref 26.0–34.0)
MCHC: 32 g/dL (ref 30.0–36.0)
MCV: 91.1 fL (ref 78.0–100.0)
Platelets: 211 10*3/uL (ref 150–400)
RBC: 3.02 MIL/uL — AB (ref 4.22–5.81)
RDW: 15.1 % (ref 11.5–15.5)
WBC: 6.8 10*3/uL (ref 4.0–10.5)

## 2015-07-24 LAB — HEPATITIS B SURFACE ANTIGEN: HEP B S AG: NEGATIVE

## 2015-07-24 LAB — HEPATITIS B SURFACE ANTIBODY,QUALITATIVE: HEP B S AB: NONREACTIVE

## 2015-07-24 SURGERY — INSERTION OF DIALYSIS CATHETER
Anesthesia: General | Site: Chest | Laterality: Right

## 2015-07-24 MED ORDER — LIDOCAINE 2% (20 MG/ML) 5 ML SYRINGE
INTRAMUSCULAR | Status: AC
  Filled 2015-07-24: qty 5

## 2015-07-24 MED ORDER — LIDOCAINE HCL (CARDIAC) 20 MG/ML IV SOLN
INTRAVENOUS | Status: DC | PRN
  Administered 2015-07-24: 50 mL via INTRATRACHEAL

## 2015-07-24 MED ORDER — HEPARIN SODIUM (PORCINE) 1000 UNIT/ML IJ SOLN
INTRAMUSCULAR | Status: DC | PRN
  Administered 2015-07-24: 3.4 mL via INTRAVENOUS

## 2015-07-24 MED ORDER — ALTEPLASE 2 MG IJ SOLR: 2.0000 mg | Freq: Once | INTRAMUSCULAR | Status: DC | PRN

## 2015-07-24 MED ORDER — ONDANSETRON HCL 4 MG/2ML IJ SOLN
INTRAMUSCULAR | Status: DC | PRN
  Administered 2015-07-24: 4 mg via INTRAVENOUS

## 2015-07-24 MED ORDER — WARFARIN - PHARMACIST DOSING INPATIENT
Freq: Every day | Status: DC
  Administered 2015-07-24 – 2015-07-27 (×4)

## 2015-07-24 MED ORDER — LIDOCAINE-PRILOCAINE 2.5-2.5 % EX CREA: 1.0000 "application " | TOPICAL_CREAM | CUTANEOUS | Status: DC | PRN

## 2015-07-24 MED ORDER — DEXTROSE 5 % IV SOLN
3.0000 g | INTRAVENOUS | Status: DC | PRN
  Administered 2015-07-24: 2 g via INTRAVENOUS

## 2015-07-24 MED ORDER — SODIUM CHLORIDE 0.9 % IV SOLN: 100.0000 mL | INTRAVENOUS | Status: DC | PRN

## 2015-07-24 MED ORDER — LIDOCAINE-EPINEPHRINE 1 %-1:100000 IJ SOLN
INTRAMUSCULAR | Status: AC
  Filled 2015-07-24: qty 1

## 2015-07-24 MED ORDER — HEPARIN SODIUM (PORCINE) 1000 UNIT/ML IJ SOLN
INTRAMUSCULAR | Status: AC
  Filled 2015-07-24: qty 1

## 2015-07-24 MED ORDER — ROCURONIUM BROMIDE 50 MG/5ML IV SOLN
INTRAVENOUS | Status: AC
  Filled 2015-07-24: qty 1

## 2015-07-24 MED ORDER — FENTANYL CITRATE (PF) 250 MCG/5ML IJ SOLN
INTRAMUSCULAR | Status: AC
  Filled 2015-07-24: qty 5

## 2015-07-24 MED ORDER — FENTANYL CITRATE (PF) 100 MCG/2ML IJ SOLN: 25.0000 ug | INTRAMUSCULAR | Status: DC | PRN

## 2015-07-24 MED ORDER — HEPARIN SODIUM (PORCINE) 1000 UNIT/ML DIALYSIS: 20.0000 [IU]/kg | INTRAMUSCULAR | Status: DC | PRN

## 2015-07-24 MED ORDER — ONDANSETRON HCL 4 MG/2ML IJ SOLN: 4.0000 mg | Freq: Once | INTRAMUSCULAR | Status: DC | PRN

## 2015-07-24 MED ORDER — LIDOCAINE-EPINEPHRINE (PF) 1 %-1:200000 IJ SOLN
INTRAMUSCULAR | Status: DC | PRN
  Administered 2015-07-24: 10 mL

## 2015-07-24 MED ORDER — MIDAZOLAM HCL 2 MG/2ML IJ SOLN
INTRAMUSCULAR | Status: AC
  Filled 2015-07-24: qty 2

## 2015-07-24 MED ORDER — IOPAMIDOL (ISOVUE-300) INJECTION 61%
INTRAVENOUS | Status: AC
  Filled 2015-07-24: qty 50

## 2015-07-24 MED ORDER — LIDOCAINE HCL (PF) 1 % IJ SOLN: 5.0000 mL | INTRAMUSCULAR | Status: DC | PRN

## 2015-07-24 MED ORDER — WARFARIN SODIUM 7.5 MG PO TABS
7.5000 mg | ORAL_TABLET | Freq: Once | ORAL | Status: AC
  Administered 2015-07-24: 7.5 mg via ORAL
  Filled 2015-07-24: qty 1

## 2015-07-24 MED ORDER — SODIUM CHLORIDE 0.9 % IV SOLN
INTRAVENOUS | Status: DC | PRN
  Administered 2015-07-24: 07:00:00 via INTRAVENOUS

## 2015-07-24 MED ORDER — PROPOFOL 10 MG/ML IV BOLUS
INTRAVENOUS | Status: AC
  Filled 2015-07-24: qty 20

## 2015-07-24 MED ORDER — MIDAZOLAM HCL 5 MG/5ML IJ SOLN
INTRAMUSCULAR | Status: DC | PRN
  Administered 2015-07-24: 1 mg via INTRAVENOUS

## 2015-07-24 MED ORDER — SODIUM CHLORIDE 0.9 % IV SOLN
INTRAVENOUS | Status: DC | PRN
  Administered 2015-07-24: 08:00:00

## 2015-07-24 MED ORDER — HEPARIN SODIUM (PORCINE) 1000 UNIT/ML DIALYSIS: 1000.0000 [IU] | INTRAMUSCULAR | Status: DC | PRN

## 2015-07-24 MED ORDER — PENTAFLUOROPROP-TETRAFLUOROETH EX AERO: 1.0000 "application " | INHALATION_SPRAY | CUTANEOUS | Status: DC | PRN

## 2015-07-24 MED ORDER — ONDANSETRON HCL 4 MG/2ML IJ SOLN
INTRAMUSCULAR | Status: AC
  Filled 2015-07-24: qty 2

## 2015-07-24 MED ORDER — PROPOFOL 10 MG/ML IV BOLUS
INTRAVENOUS | Status: DC | PRN
  Administered 2015-07-24: 150 mg via INTRAVENOUS

## 2015-07-24 MED ORDER — FENTANYL CITRATE (PF) 100 MCG/2ML IJ SOLN
INTRAMUSCULAR | Status: DC | PRN
  Administered 2015-07-24: 50 ug via INTRAVENOUS

## 2015-07-24 MED ORDER — 0.9 % SODIUM CHLORIDE (POUR BTL) OPTIME
TOPICAL | Status: DC | PRN
  Administered 2015-07-24: 200 mL

## 2015-07-24 MED ORDER — CEFAZOLIN SODIUM 1 G IJ SOLR
INTRAMUSCULAR | Status: AC
  Filled 2015-07-24: qty 20

## 2015-07-24 SURGICAL SUPPLY — 41 items
BAG DECANTER FOR FLEXI CONT (MISCELLANEOUS) ×3 IMPLANT
BIOPATCH RED 1 DISK 7.0 (GAUZE/BANDAGES/DRESSINGS) ×2 IMPLANT
BIOPATCH RED 1IN DISK 7.0MM (GAUZE/BANDAGES/DRESSINGS) ×1
CATH PALINDROME RT-P 15FX19CM (CATHETERS) IMPLANT
CATH PALINDROME RT-P 15FX23CM (CATHETERS) ×2 IMPLANT
CATH PALINDROME RT-P 15FX28CM (CATHETERS) IMPLANT
CATH PALINDROME RT-P 15FX55CM (CATHETERS) IMPLANT
CATH STRAIGHT 5FR 65CM (CATHETERS) IMPLANT
COVER PROBE W GEL 5X96 (DRAPES) ×3 IMPLANT
DRAPE C-ARM 42X72 X-RAY (DRAPES) ×3 IMPLANT
DRAPE CHEST BREAST 15X10 FENES (DRAPES) ×3 IMPLANT
GAUZE SPONGE 2X2 8PLY STRL LF (GAUZE/BANDAGES/DRESSINGS) ×1 IMPLANT
GAUZE SPONGE 4X4 16PLY XRAY LF (GAUZE/BANDAGES/DRESSINGS) ×3 IMPLANT
GLOVE BIO SURGEON STRL SZ7 (GLOVE) ×3 IMPLANT
GLOVE BIOGEL PI IND STRL 7.5 (GLOVE) ×1 IMPLANT
GLOVE BIOGEL PI INDICATOR 7.5 (GLOVE) ×2
GOWN STRL REUS W/ TWL LRG LVL3 (GOWN DISPOSABLE) ×2 IMPLANT
GOWN STRL REUS W/TWL LRG LVL3 (GOWN DISPOSABLE) ×6
KIT BASIN OR (CUSTOM PROCEDURE TRAY) ×3 IMPLANT
KIT ROOM TURNOVER OR (KITS) ×3 IMPLANT
LIQUID BAND (GAUZE/BANDAGES/DRESSINGS) IMPLANT
NDL 18GX1X1/2 (RX/OR ONLY) (NEEDLE) ×1 IMPLANT
NDL HYPO 25GX1X1/2 BEV (NEEDLE) ×1 IMPLANT
NEEDLE 18GX1X1/2 (RX/OR ONLY) (NEEDLE) ×3 IMPLANT
NEEDLE HYPO 25GX1X1/2 BEV (NEEDLE) ×3 IMPLANT
NS IRRIG 1000ML POUR BTL (IV SOLUTION) ×3 IMPLANT
PACK SURGICAL SETUP 50X90 (CUSTOM PROCEDURE TRAY) ×3 IMPLANT
PAD ARMBOARD 7.5X6 YLW CONV (MISCELLANEOUS) ×6 IMPLANT
SET MICROPUNCTURE 5F STIFF (MISCELLANEOUS) IMPLANT
SOAP 2 % CHG 4 OZ (WOUND CARE) ×3 IMPLANT
SPONGE GAUZE 2X2 STER 10/PKG (GAUZE/BANDAGES/DRESSINGS) ×2
SUT ETHILON 3 0 PS 1 (SUTURE) ×3 IMPLANT
SUT MNCRL AB 4-0 PS2 18 (SUTURE) ×3 IMPLANT
SYR 20CC LL (SYRINGE) ×6 IMPLANT
SYR 3ML LL SCALE MARK (SYRINGE) ×3 IMPLANT
SYR 5ML LL (SYRINGE) ×3 IMPLANT
SYR CONTROL 10ML LL (SYRINGE) ×3 IMPLANT
SYRINGE 10CC LL (SYRINGE) ×3 IMPLANT
TAPE CLOTH SURG 4X10 WHT LF (GAUZE/BANDAGES/DRESSINGS) ×2 IMPLANT
WATER STERILE IRR 1000ML POUR (IV SOLUTION) ×1 IMPLANT
WIRE AMPLATZ SS-J .035X180CM (WIRE) IMPLANT

## 2015-07-24 NOTE — Op Note (Signed)
OPERATIVE NOTE  PROCEDURE: 1. Right internal jugular vein tunneled dialysis catheter placement 2. Right internal jugular vein cannulation under ultrasound guidance  PRE-OPERATIVE DIAGNOSIS: end-stage renal failure  POST-OPERATIVE DIAGNOSIS: same as above  SURGEON: Adele Barthel, MD  ANESTHESIA: general  ESTIMATED BLOOD LOSS: 30 cc  FINDING(S): 1.  Tips of the catheter in the right atrium on fluoroscopy 2.  No obvious pneumothorax on fluoroscopy  SPECIMEN(S):  none  INDICATIONS:   Kevin James is a 60 y.o. male who presents with end stage renal disease.  The patient presents for tunneled dialysis catheter placement.  The patient is aware the risks of tunneled dialysis catheter placement include but are not limited to: bleeding, infection, central venous injury, pneumothorax, possible venous stenosis, possible malpositioning in the venous system, and possible infections related to long-term catheter presence.  The patient was aware of these risks and agreed to proceed.  DESCRIPTION: After written full informed consent was obtained from the patient, the patient was taken back to the operating room.  Prior to induction, the patient was given IV antibiotics.  After obtaining adequate sedation, the patient was prepped and draped in the standard fashion for a chest or neck tunneled dialysis catheter placement.   The cannulation site, the catheter exit site, and tract for the subcutaneous tunnel were then anesthestized with a total of 10 cc of 1% lidocaine without epinephrine.  Under ultrasound guidance, the right internal jugular vein was cannulated with the 18 gauge needle.  A J-wire was then placed down into the inferior vena cava under fluoroscopic guidance.  The wire was then secured in place with a clamp to the drapes.  I then made stab incisions at the neck and exit sites.   I dissected from the exit site to the cannulation site with a tunneler.   The subcutaneous tunnel was dilated  by passing a plastic dilator over the metal dissector. The wire was then unclamped and I removed the needle.  The skin tract and venotomy was dilated serially with dilators.  Finally, the dilator-sheath was placed under fluoroscopic guidance into the superior vena cava.  The dilator and wire were removed.  A 23 cm Palindrome catheter was placed under fluoroscopic guidance down into the right atrium.  The sheath was broken and peeled away while holding the catheter cuff at the level of the skin.  The back end of this catheter was transected, and docked onto the tunneler.  The distal catheter was delivered through the subcutaneous tunnel.  The catheter was transected a second time, revealing the two lumens of this catheter.  The ports were docked onto these two lumens.  The catheter collar was then snapped into place.  Each port was tested by aspirating and flushing.  No resistance was noted.  Each port was then thoroughly flushed with heparinized saline.  The catheter was secured in placed with two interrupted stitches of 3-0 Nylon tied to the catheter.  The neck incision was closed with a U-stitch of 4-0 Monocryl.  The neck and chest incision were cleaned and sterile bandages applied.  Each port was then loaded with concentrated heparin (1000 Units/mL) at the manufacturer recommended volumes to each port.  Sterile caps were applied to each port.    On completion fluoroscopy, the tips of the catheter were in the right atrium, and there was no evidence of pneumothorax.   COMPLICATIONS: none  CONDITION: stable   Adele Barthel, MD [facility] [place] Office: [phone] Pager:  [phone]  07/24/2015, 8:31 AM

## 2015-07-24 NOTE — Transfer of Care (Signed)
Immediate Anesthesia Transfer of Care Note  Patient: Kevin James  Procedure(s) Performed: Procedure(s): INSERTION OF rIGHT iNTERNAL jUGULAR DIALYSIS CATHETER (Right)  Patient Location: PACU  Anesthesia Type:General  Level of Consciousness: awake, alert , oriented and patient cooperative  Airway & Oxygen Therapy: Patient Spontanous Breathing and Patient connected to nasal cannula oxygen  Post-op Assessment: Report given to RN, Post -op Vital signs reviewed and stable and Patient moving all extremities  Post vital signs: Reviewed and stable  Last Vitals:  Filed Vitals:   07/23/15 2130 07/24/15 0531  BP:  155/76  Pulse:  58  Temp:  36.7 C  Resp: 16 17    Last Pain:  Filed Vitals:   07/24/15 0532  PainSc: 0-No pain      Patients Stated Pain Goal: 0 ([id number])  Complications: No apparent anesthesia complications
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
## 2015-07-24 NOTE — Anesthesia Procedure Notes (Signed)
Procedure Name: LMA Insertion Date/Time: 07/24/2015 7:49 AM Performed by: Greggory Stallion, Espyn Radwan L Pre-anesthesia Checklist: Patient identified, Emergency Drugs available, Suction available and Patient being monitored Patient Re-evaluated:Patient Re-evaluated prior to inductionOxygen Delivery Method: Circle System Utilized Preoxygenation: Pre-oxygenation with 100% oxygen Intubation Type: IV induction Ventilation: Mask ventilation without difficulty LMA: LMA with gastric port inserted LMA Size: 4.0 and 5.0 Number of attempts: 1 Placement Confirmation: positive ETCO2 Tube secured with: Tape Dental Injury: Teeth and Oropharynx as per pre-operative assessment

## 2015-07-24 NOTE — Anesthesia Postprocedure Evaluation (Signed)
Anesthesia Post Note  Patient: Kevin James  Procedure(s) Performed: Procedure(s) (LRB): INSERTION OF rIGHT iNTERNAL jUGULAR DIALYSIS CATHETER (Right)  Patient location during evaluation: PACU Anesthesia Type: General Level of consciousness: awake and alert Pain management: pain level controlled Vital Signs Assessment: post-procedure vital signs reviewed and stable Respiratory status: spontaneous breathing, nonlabored ventilation, respiratory function stable and patient connected to nasal cannula oxygen Cardiovascular status: blood pressure returned to baseline and stable Postop Assessment: no signs of nausea or vomiting Anesthetic complications: no    Last Vitals:  Filed Vitals:   07/24/15 1324 07/24/15 1400  BP: 160/88 148/84  Pulse: 65 66  Temp:    Resp: 18 18    Last Pain:  Filed Vitals:   07/24/15 1401  PainSc: 0-No pain                 Catalina Gravel

## 2015-07-24 NOTE — Progress Notes (Signed)
PROGRESS NOTE    Kevin James  [id number] DOB: 01/23/1955 DOA: 07/22/2015 PCP: Jerlyn Ly, MD  Brief Narrative: This is a 60 year old male with a history of known history of chronic kidney disease. His AV fistula was placed by Dr. Kellie Simmering May 24, it can be used by August 24. He is also recently been placed on IV iron every 2 weeks. His hemoglobin has been trending down. Over the past week and a half he has been very weak and cold. He is having increasing shortness of breath. He finally came to ER and his hemoglobin is now 7.6. He denies any chest pains. He has never had a colonoscopy. He is on the renal transplant list.  Renal consulted, HD cath placed, starting HD today  Assessment & Plan: Progressive CKD5 -has AVF, s/p Hd catheter to start HD today -a lot of symptoms of uremia -Renal following -xarelto stopped, not appropriate in ESRD  Anemia  -progressive, Hb was 13.6 in 7/16 and 7.6 on admission -no overt blood loss noted, likely due to CKD -Anemia panel c/w chronic disease, ferritin low too, hemoccult pending -scheduled for Colonoscopy in Aug/2017 with [facility]  Obstructive sleep apnea on CPAP  -CPAP ordered  Hypertension  -Stable, continue verapamil -IV lopressor PRN  Paroxysmal atrial fibrillation  -Stable, continue verapamil -Xarelto stopped, will start Warfarin since procedures completed, i d/w pt and wife -will update Dr.Ross his cardiologist tomorrow  Gout  -Continue colchicine  Diabetes mellitus  -hold Tradjenta, ADA diet, sliding-scale insulin  Dyslipidemia  -Stable, resume statin  Full Code Communication: wife at bedside Dispo: home when CLipping for HD complete and uremic symptoms better  Consultants:   Renal   Subjective: Feels ok, no new complaints  Objective: Filed Vitals:   07/24/15 0842 07/24/15 0857 07/24/15 0912 07/24/15 0950  BP: 146/65 156/74 148/71 152/73  Pulse: 67 57 62 62  Temp: 98 F (36.7 C)  97.9 F (36.6 C) 98.4 F  (36.9 C)  TempSrc:    Oral  Resp: 18 15 13 17   Height:      Weight:      SpO2: 97% 96% 99% 99%    Intake/Output Summary (Last 24 hours) at 07/24/15 1217 Last data filed at 07/24/15 0950  Gross per 24 hour  Intake    970 ml  Output     50 ml  Net    920 ml   Filed Weights   07/22/15 2211  Weight: 108.773 kg (239 lb 12.8 oz)    Examination:  General exam: Appears calm and comfortable, AAOx3 Respiratory system: diminished BS at bases Cardiovascular system: S1 & S2 heard, RRR. No JVD, murmurs, rubs, gallops or clicks. No pedal edema. Gastrointestinal system: Abdomen is nondistended, soft and nontender. No organomegaly or masses felt. Normal bowel Central nervous system: Alert and oriented. No focal neurological deficits. Extremities: Symmetric 5 x 5 power. Skin: No rashes, lesions or ulcers Psychiatry: Judgement and insight appear normal. Mood & affect appropriate.     Data Reviewed: I have personally reviewed following labs and imaging studies  CBC:  Recent Labs Lab 07/22/15 1730 07/23/15 0512 07/24/15 0342  WBC 7.8 8.6 6.8  HGB 7.6* 7.7* 8.8*  HCT 24.0* 24.1* 27.5*  MCV 93.8 91.6 91.1  PLT 226 215 [id number]   Basic Metabolic Panel:  Recent Labs Lab 07/22/15 1730 07/23/15 0512 07/24/15 0342  NA 139 141 139  K 4.6 5.2* 4.7  CL 109 110 110  CO2 16* 16* 17*  GLUCOSE 106*  86 74  BUN 112* 105* 105*  CREATININE 8.52* 8.41* 7.83*  CALCIUM 9.0 9.2 8.6*  PHOS  --   --  9.8*   GFR: Estimated Creatinine Clearance: 13.1 mL/min (by C-G formula based on Cr of 7.83). Liver Function Tests:  Recent Labs Lab 07/24/15 0342  ALBUMIN 2.8*   No results for input(s): LIPASE, AMYLASE in the last 168 hours. No results for input(s): AMMONIA in the last 168 hours. Coagulation Profile: No results for input(s): INR, PROTIME in the last 168 hours. Cardiac Enzymes:  Recent Labs Lab 07/22/15 2319 07/23/15 0512 07/23/15 1107  TROPONINI 0.03* 0.03* <0.03   BNP (last 3  results) No results for input(s): PROBNP in the last 8760 hours. HbA1C: No results for input(s): HGBA1C in the last 72 hours. CBG:  Recent Labs Lab 07/23/15 2100 07/24/15 0625 07/24/15 0844 07/24/15 0949 07/24/15 1140  GLUCAP 96 90 122* 96 111*   Lipid Profile: No results for input(s): CHOL, HDL, LDLCALC, TRIG, CHOLHDL, LDLDIRECT in the last 72 hours. Thyroid Function Tests: No results for input(s): TSH, T4TOTAL, FREET4, T3FREE, THYROIDAB in the last 72 hours. Anemia Panel:  Recent Labs  07/23/15 1107  VITAMINB12 584  FOLATE 17.0  FERRITIN 12*  TIBC 336  IRON 147  RETICCTPCT 3.3*   Urine analysis:    Component Value Date/Time   COLORURINE YELLOW 07/23/2015 1522   APPEARANCEUR CLEAR 07/23/2015 1522   LABSPEC 1.013 07/23/2015 1522   PHURINE 6.0 07/23/2015 1522   GLUCOSEU 100* 07/23/2015 1522   HGBUR SMALL* 07/23/2015 1522   BILIRUBINUR NEGATIVE 07/23/2015 1522   KETONESUR NEGATIVE 07/23/2015 1522   PROTEINUR >300* 07/23/2015 1522   NITRITE NEGATIVE 07/23/2015 1522   LEUKOCYTESUR NEGATIVE 07/23/2015 1522   Sepsis Labs: @LABRCNTIP (procalcitonin:4,lacticidven:4)  ) Recent Results (from the past 240 hour(s))  Surgical pcr screen     Status: None   Collection Time: 07/23/15  8:44 PM  Result Value Ref Range Status   MRSA, PCR NEGATIVE NEGATIVE Final   Staphylococcus aureus NEGATIVE NEGATIVE Final    Comment:        The Xpert SA Assay (FDA approved for NASAL specimens in patients over 61 years of age), is one component of a comprehensive surveillance program.  Test performance has been validated by [facility] for patients greater than or equal to 66 year old. It is not intended to diagnose infection nor to guide or monitor treatment.          Radiology Studies: Dg Chest 2 View  07/22/2015  CLINICAL DATA:  Acute chest pain and shortness of breath for several weeks. EXAM: CHEST  2 VIEW COMPARISON:  07/18/2014 and prior exams FINDINGS: Upper limits  normal heart size and mild peribronchial thickening again noted. Thoracic aortic atherosclerotic calcifications again noted. There is no evidence of focal airspace disease, pulmonary edema, suspicious pulmonary nodule/mass, pleural effusion, or pneumothorax. No acute bony abnormalities are identified. IMPRESSION: No evidence of acute cardiopulmonary disease. Thoracic aortic atherosclerosis and mild chronic peribronchial thickening. Electronically Signed   By: Margarette Canada M.D.   On: 07/22/2015 18:42   Dg Chest Port 1 View  07/24/2015  CLINICAL DATA:  Insertion of dialysis catheter done in OR 11 Fluoro time = 20.0 seconds; ESRD; s/p new diatek placement EXAM: CHEST 1 VIEW COMPARISON:  07/22/2015 FINDINGS: Right-sided dialysis catheter has been placed, tip overlying the level of the superior vena cava. The heart size is normal. No pneumothorax. Prominent thoracic osteophytes are again identified. IMPRESSION: Interval placement of right-sided dialysis  catheter, tip overlying the level of superior vena cava. Electronically Signed   By: Nolon Nations M.D.   On: 07/24/2015 11:21   Dg Fluoro Guide Cv Line-no Report  07/24/2015  CLINICAL DATA:  FLOURO GUIDE CV LINE Fluoroscopy was utilized by the requesting physician.  No radiographic interpretation.        Scheduled Meds: . atorvastatin  80 mg Oral q1800  . colchicine  0.6 mg Oral Daily  . docusate sodium  100 mg Oral BID  . feeding supplement (NEPRO CARB STEADY)  237 mL Oral Q24H  . insulin aspart  0-15 Units Subcutaneous TID WC  . insulin aspart  0-5 Units Subcutaneous QHS  . sodium chloride flush  3 mL Intravenous Q12H  . verapamil  360 mg Oral Q lunch   Continuous Infusions:    LOS: 2 days    Time spent: 22min    Domenic Polite, MD [facility] Pager [phone]  If 7PM-7AM, please contact night-coverage [email or web address] Password [facility] 07/24/2015, 12:17 PM
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
## 2015-07-24 NOTE — Progress Notes (Signed)
Patient ID: Kevin James, male   DOB: [birth year], 60 y.o.   MRN: [id number] S: still with anorexia and malaise, s/p HD catheter O:BP 152/73 mmHg  Pulse 62  Temp(Src) 98.4 F (36.9 C) (Oral)  Resp 17  Ht 6\' 1"  (1.854 m)  Wt 108.773 kg (239 lb 12.8 oz)  BMI 31.64 kg/m2  SpO2 99%  Intake/Output Summary (Last 24 hours) at 07/24/15 0954 Last data filed at 07/24/15 0950  Gross per 24 hour  Intake   1305 ml  Output     50 ml  Net   1255 ml   Intake/Output: I/O last 3 completed shifts: In: 1668.3 [P.O.:720; I.V.:250; Blood:698.3] Out: 0   Intake/Output this shift:  Total I/O In: 370 [P.O.:120; I.V.:250] Out: 50 [Blood:50] Weight change:  Gen:WD WN WM lethargic CVS:no rub Resp:occ rhonchi [id number] Ext:no edema, L AVF +T/B   Recent Labs Lab 07/22/15 1730 07/23/15 0512 07/24/15 0342  NA 139 141 139  K 4.6 5.2* 4.7  CL 109 110 110  CO2 16* 16* 17*  GLUCOSE 106* 86 74  BUN 112* 105* 105*  CREATININE 8.52* 8.41* 7.83*  ALBUMIN  --   --  2.8*  CALCIUM 9.0 9.2 8.6*  PHOS  --   --  9.8*   Liver Function Tests:  Recent Labs Lab 07/24/15 0342  ALBUMIN 2.8*   No results for input(s): LIPASE, AMYLASE in the last 168 hours. No results for input(s): AMMONIA in the last 168 hours. CBC:  Recent Labs Lab 07/22/15 1730 07/23/15 0512 07/24/15 0342  WBC 7.8 8.6 6.8  HGB 7.6* 7.7* 8.8*  HCT 24.0* 24.1* 27.5*  MCV 93.8 91.6 91.1  PLT 226 215 211   Cardiac Enzymes:  Recent Labs Lab 07/22/15 2319 07/23/15 0512 07/23/15 1107  TROPONINI 0.03* 0.03* <0.03   CBG:  Recent Labs Lab 07/23/15 1650 07/23/15 2100 07/24/15 0625 07/24/15 0844 07/24/15 0949  GLUCAP 101* 96 90 122* 96    Iron Studies:  Recent Labs  07/23/15 1107  IRON 147  TIBC 336  FERRITIN 12*   Studies/Results: Dg Chest 2 View  07/22/2015  CLINICAL DATA:  Acute chest pain and shortness of breath for several weeks. EXAM: CHEST  2 VIEW COMPARISON:  07/18/2014 and prior exams FINDINGS:  Upper limits normal heart size and mild peribronchial thickening again noted. Thoracic aortic atherosclerotic calcifications again noted. There is no evidence of focal airspace disease, pulmonary edema, suspicious pulmonary nodule/mass, pleural effusion, or pneumothorax. No acute bony abnormalities are identified. IMPRESSION: No evidence of acute cardiopulmonary disease. Thoracic aortic atherosclerosis and mild chronic peribronchial thickening. Electronically Signed   By: Margarette Canada M.D.   On: 07/22/2015 18:42   Dg Fluoro Guide Cv Line-no Report  07/24/2015  CLINICAL DATA:  FLOURO GUIDE CV LINE Fluoroscopy was utilized by the requesting physician.  No radiographic interpretation.   Marland Kitchen atorvastatin  80 mg Oral q1800  . colchicine  0.6 mg Oral Daily  . docusate sodium  100 mg Oral BID  . feeding supplement (NEPRO CARB STEADY)  237 mL Oral Q24H  . insulin aspart  0-15 Units Subcutaneous TID WC  . insulin aspart  0-5 Units Subcutaneous QHS  . sodium chloride flush  3 mL Intravenous Q12H  . verapamil  360 mg Oral Q lunch    BMET    Component Value Date/Time   NA 139 07/24/2015 0342   K 4.7 07/24/2015 0342   CL 110 07/24/2015 0342   CO2 17* 07/24/2015 0342  GLUCOSE 74 07/24/2015 0342   BUN 105* 07/24/2015 0342   CREATININE 7.83* 07/24/2015 0342   CALCIUM 8.6* 07/24/2015 0342   GFRNONAA 7* 07/24/2015 0342   GFRAA 8* 07/24/2015 0342   CBC    Component Value Date/Time   WBC 6.8 07/24/2015 0342   RBC 3.02* 07/24/2015 0342   RBC 2.68* 07/23/2015 1107   HGB 8.8* 07/24/2015 0342   HCT 27.5* 07/24/2015 0342   PLT 211 07/24/2015 0342   MCV 91.1 07/24/2015 0342   MCH 29.1 07/24/2015 0342   MCHC 32.0 07/24/2015 0342   RDW 15.1 07/24/2015 0342   LYMPHSABS 2.3 07/28/2014 0937   MONOABS 0.7 07/28/2014 0937   EOSABS 0.3 07/28/2014 0937   BASOSABS 0.1 07/28/2014 0937     Assessment/Plan: 1. Progressive CKD now at stage 5. Will need to initiate HD. Consulted [facility] for placement of HD  catheter and will initiate HD once that is placed. Will also start the CLIP process, although difficult since he lives in [place], [place]. He would like to pursue home therapies once stabilized. 1. Plan for first HD today and for the next 2 days 2. Start CLIP process 2. Anemia of chronic disease-  1. S/p one unit of PRBC with appropriate bump in hgb 2. start Aranesp and follow.  3. Will also need to r/o GIB. 3. CKD-BMD- will follow calcium, phos, iPTH 4. Vascular access- LAVF placed 06/01/15 and s/p tunneled HD catheter by Dr. Bridgett Larsson on 07/24/15 (appreciate his assistance) 5. N/V/anorexia- likely due to uremia and plan for HD as above 6. SOB- presumably due to anemia 7. Hyperkalemia- will plan for HD 8. Deconditioning- will benefit from PT/OT  Donetta Potts

## 2015-07-25 ENCOUNTER — Encounter (HOSPITAL_COMMUNITY): Payer: BLUE CROSS/BLUE SHIELD

## 2015-07-25 ENCOUNTER — Encounter (HOSPITAL_COMMUNITY): Payer: Self-pay | Admitting: Vascular Surgery

## 2015-07-25 DIAGNOSIS — D649 Anemia, unspecified: Secondary | ICD-10-CM

## 2015-07-25 LAB — GLUCOSE, CAPILLARY
GLUCOSE-CAPILLARY: 98 mg/dL (ref 65–99)
GLUCOSE-CAPILLARY: 87 mg/dL (ref 65–99)
GLUCOSE-CAPILLARY: 107 mg/dL — AB (ref 65–99)
Glucose-Capillary: 93 mg/dL (ref 65–99)

## 2015-07-25 LAB — IRON AND TIBC
Iron: 37 ug/dL — ABNORMAL LOW (ref 45–182)
SATURATION RATIOS: 11 % — AB (ref 17.9–39.5)
TIBC: 328 ug/dL (ref 250–450)
UIBC: 291 ug/dL

## 2015-07-25 LAB — PROTIME-INR
INR: 1.22 (ref 0.00–1.49)
Prothrombin Time: 15.5 seconds — ABNORMAL HIGH (ref 11.6–15.2)

## 2015-07-25 LAB — FERRITIN: Ferritin: 22 ng/mL — ABNORMAL LOW (ref 24–336)

## 2015-07-25 MED ORDER — DARBEPOETIN ALFA 100 MCG/0.5ML IJ SOSY: 100.0000 ug | PREFILLED_SYRINGE | INTRAMUSCULAR | Status: DC

## 2015-07-25 MED ORDER — NEPRO/CARBSTEADY PO LIQD
237.0000 mL | Freq: Two times a day (BID) | ORAL | Status: DC
  Administered 2015-07-26 (×2): 237 mL via ORAL

## 2015-07-25 MED ORDER — CALCIUM ACETATE (PHOS BINDER) 667 MG PO CAPS
1334.0000 mg | ORAL_CAPSULE | Freq: Three times a day (TID) | ORAL | Status: DC
  Administered 2015-07-25 – 2015-07-27 (×5): 1334 mg via ORAL
  Filled 2015-07-25 (×5): qty 2

## 2015-07-25 MED ORDER — HEPARIN SODIUM (PORCINE) 1000 UNIT/ML DIALYSIS: 20.0000 [IU]/kg | INTRAMUSCULAR | Status: DC | PRN

## 2015-07-25 MED ORDER — WARFARIN SODIUM 7.5 MG PO TABS
7.5000 mg | ORAL_TABLET | Freq: Once | ORAL | Status: AC
  Administered 2015-07-25: 7.5 mg via ORAL
  Filled 2015-07-25: qty 1

## 2015-07-25 NOTE — Progress Notes (Signed)
Handout given on phoslo.   Joellen Jersey, RN.

## 2015-07-25 NOTE — Progress Notes (Signed)
Patient ID: Kevin James, male   DOB: 1955-01-16, 60 y.o.   MRN: [id number] S: still with anorexia and malaise, s/p HD catheter and first HD yest   O:BP 163/69 mmHg  Pulse 57  Temp(Src) 98 F (36.7 C) (Oral)  Resp 18  Ht 6\' 1"  (1.854 m)  Wt 105.643 kg (232 lb 14.4 oz)  BMI 30.73 kg/m2  SpO2 100%  Intake/Output Summary (Last 24 hours) at 07/25/15 1033 Last data filed at 07/25/15 0900  Gross per 24 hour  Intake    940 ml  Output      0 ml  Net    940 ml   Intake/Output: I/O last 3 completed shifts: In: [id number] [P.O.:1200; I.V.:250] Out: 50 [Blood:50]  Intake/Output this shift:  Total I/O In: 220 [P.O.:220] Out: -  Weight change:  Gen:WD WN WM lethargic CVS:no rub Resp:occ rhonchi [id number] Ext:no edema, L AVF +T/B   Recent Labs Lab 07/22/15 1730 07/23/15 0512 07/24/15 0342  NA 139 141 139  K 4.6 5.2* 4.7  CL 109 110 110  CO2 16* 16* 17*  GLUCOSE 106* 86 74  BUN 112* 105* 105*  CREATININE 8.52* 8.41* 7.83*  ALBUMIN  --   --  2.8*  CALCIUM 9.0 9.2 8.6*  PHOS  --   --  9.8*   Liver Function Tests:  Recent Labs Lab 07/24/15 0342  ALBUMIN 2.8*   No results for input(s): LIPASE, AMYLASE in the last 168 hours. No results for input(s): AMMONIA in the last 168 hours. CBC:  Recent Labs Lab 07/22/15 1730 07/23/15 0512 07/24/15 0342  WBC 7.8 8.6 6.8  HGB 7.6* 7.7* 8.8*  HCT 24.0* 24.1* 27.5*  MCV 93.8 91.6 91.1  PLT 226 215 211   Cardiac Enzymes:  Recent Labs Lab 07/22/15 2319 07/23/15 0512 07/23/15 1107  TROPONINI 0.03* 0.03* <0.03   CBG:  Recent Labs Lab 07/24/15 0949 07/24/15 1140 07/24/15 1712 07/24/15 2051 07/25/15 0750  GLUCAP 96 111* 70 142* 98    Iron Studies:   Recent Labs  07/23/15 1107  IRON 147  TIBC 336  FERRITIN 12*   Studies/Results: Dg Chest Port 1 View  07/24/2015  CLINICAL DATA:  Insertion of dialysis catheter done in OR 11 Fluoro time = 20.0 seconds; ESRD; s/p new diatek placement EXAM: CHEST 1 VIEW  COMPARISON:  07/22/2015 FINDINGS: Right-sided dialysis catheter has been placed, tip overlying the level of the superior vena cava. The heart size is normal. No pneumothorax. Prominent thoracic osteophytes are again identified. IMPRESSION: Interval placement of right-sided dialysis catheter, tip overlying the level of superior vena cava. Electronically Signed   By: Nolon Nations M.D.   On: 07/24/2015 11:21   Dg Fluoro Guide Cv Line-no Report  07/24/2015  CLINICAL DATA:  FLOURO GUIDE CV LINE Fluoroscopy was utilized by the requesting physician.  No radiographic interpretation.   Marland Kitchen atorvastatin  80 mg Oral q1800  . colchicine  0.6 mg Oral Daily  . docusate sodium  100 mg Oral BID  . feeding supplement (NEPRO CARB STEADY)  237 mL Oral Q24H  . insulin aspart  0-15 Units Subcutaneous TID WC  . insulin aspart  0-5 Units Subcutaneous QHS  . sodium chloride flush  3 mL Intravenous Q12H  . verapamil  360 mg Oral Q lunch  . warfarin  7.5 mg Oral ONCE-1800  . Warfarin - Pharmacist Dosing Inpatient   Does not apply q1800    BMET    Component Value Date/Time   NA 139  07/24/2015 0342   K 4.7 07/24/2015 0342   CL 110 07/24/2015 0342   CO2 17* 07/24/2015 0342   GLUCOSE 74 07/24/2015 0342   BUN 105* 07/24/2015 0342   CREATININE 7.83* 07/24/2015 0342   CALCIUM 8.6* 07/24/2015 0342   GFRNONAA 7* 07/24/2015 0342   GFRAA 8* 07/24/2015 0342   CBC    Component Value Date/Time   WBC 6.8 07/24/2015 0342   RBC 3.02* 07/24/2015 0342   RBC 2.68* 07/23/2015 1107   HGB 8.8* 07/24/2015 0342   HCT 27.5* 07/24/2015 0342   PLT 211 07/24/2015 0342   MCV 91.1 07/24/2015 0342   MCH 29.1 07/24/2015 0342   MCHC 32.0 07/24/2015 0342   RDW 15.1 07/24/2015 0342   LYMPHSABS 2.3 07/28/2014 0937   MONOABS 0.7 07/28/2014 0937   EOSABS 0.3 07/28/2014 0937   BASOSABS 0.1 07/28/2014 0937     Assessment/Plan: 1. Progressive CKD now at stage 5. Will need to initiate HD. s/p placement of HD catheter first HD  7/16- for second today and third tomorrow Will also start the CLIP process, although difficult since he lives in [place], [place]. He would like to pursue home therapies once stabilized. Says can do [place] because wants to stay with [facility]- transportation may be difficult.  I think he will eventually be able to drive but not right off and I also think could do Nx stage but will need to have AVF working ideally before trained    2. Anemia of chronic disease-  1. S/p one unit of PRBC with appropriate bump in hgb 2. start Aranesp and follow.  3. Will also need to r/o GIB. 3. CKD-BMD- will follow calcium, phos, iPTH 4. Vascular access- LAVF placed 06/01/15 and s/p tunneled HD catheter by Dr. Bridgett Larsson on 07/24/15 (appreciate his assistance) 5. N/V/anorexia- likely due to uremia and plan for HD as above 6. SOB- presumably due to anemia 7. Hyperkalemia- will plan for HD- resolved - 2 K bath  8. Deconditioning- will benefit from PT/OT  Therasa Lorenzi A

## 2015-07-25 NOTE — Progress Notes (Signed)
07/25/2015 3:30 PM Hemodialysis Outpatient Note; I have initiated the "CLIP" process. We are looking for a schedule at the [facility]. I will update when I receive any information. Thank you. Gordy Savers

## 2015-07-25 NOTE — Progress Notes (Signed)
   Daily Progress Note  HD via TDC reportedly running ok.  No bleeding at exit site or neck cannulation.  Access duplex on 06/27/15 demonstrates a shallow fistula only 3.5-5.1 mm in diameter.    - Recheck maturation in one month in the office - If not mature by then, would consider Balloon assisted maturation procedure.   Adele Barthel, MD [facility] Office: [phone] Pager: [phone]  07/25/2015, 2:26 PM

## 2015-07-25 NOTE — Progress Notes (Signed)
Nutrition Follow-up  DOCUMENTATION CODES:   Obesity unspecified  INTERVENTION:  Provide Nepro Shake po BID, each supplement provides 425 kcal and 19 grams protein.  Encourage adequate PO intake.   Renal diet education handout given.   NUTRITION DIAGNOSIS:   Inadequate oral intake related to chronic illness (Taste Changes (CKD5)) as evidenced by meal completion < 50%; improving  GOAL:   Patient will meet greater than or equal to 90% of their needs; progressing  MONITOR:   PO intake, Supplement acceptance, Weight trends, Labs, I & O's, Skin  REASON FOR ASSESSMENT:   Malnutrition Screening Tool    ASSESSMENT:   60 y/o male PMhx gout, HTN, CKDIV, DM, PAF, OSA. He had AV fistula placed on 5/24, can be used 9/24. Presents with 1.5 weeks of weakness and feeling cold. Worked up for symptomatic anemia.  HD has been initiated.   PROCEDURE (7/16): 1. Right internal jugular vein tunneled dialysis catheter placement 2. Right internal jugular vein cannulation under ultrasound guidance  Meal completion has been 25-75% with 75% intake today. RD to modify Nepro orders to BID to aid in protein needs. RD was consulted for a renal diet education. Pt was given handout "Eating Healthy with Kidney Disease". Pt requested to revisit for oral education as his wife had just stepped out of the room to eat lunch. RD later revisited after lunch. Pt in HD. Wife was present in pt room. She reports she has looked over the handout and has no questions regarding the material. She reports understanding the material.   Diet Order:  Diet renal with fluid restriction Fluid restriction:: 1200 mL Fluid; Room service appropriate?: Yes; Fluid consistency:: Thin  Skin:   (Incision on neck right)  Last BM:  7/14  Height:   Ht Readings from Last 1 Encounters:  07/22/15 6\' 1"  (1.854 m)    Weight:   Wt Readings from Last 1 Encounters:  07/25/15 230 lb 6.1 oz (104.5 kg)    Ideal Body Weight:  83.64  kg  BMI:  Body mass index is 30.4 kg/(m^2).  Estimated Nutritional Needs:   Kcal:  2200-2400 (28-31 kcal/kg sbw)  Protein:  115-130 grams  Fluid:  1.2 L/day  EDUCATION NEEDS:   Education needs addressed  Corrin Parker, MS, RD, LDN Pager # [phone] After hours/ weekend pager # [phone]

## 2015-07-25 NOTE — Progress Notes (Signed)
Patient given handouts on chronic renal failure, HD, HD access, PD, and renal diet. Also instructed patient and patient's wife how to watch dialysis videos (overview, HD, PD, transplantation, coping, and anemia). Instructed them to read handouts and watch videos and write down any questions they may have for the nephrologist and/or RN. Dietician had also been consulted and had already seen patient.   Joellen Jersey, RN.

## 2015-07-25 NOTE — Procedures (Signed)
Patient was seen on dialysis and the procedure was supervised.  BFR 350  Via PC BP is  163/69.   Patient appears to be tolerating treatment well  Kevin James A 07/25/2015

## 2015-07-25 NOTE — Progress Notes (Signed)
ANTICOAGULATION CONSULT NOTE  Pharmacy Consult for Coumadin Indication: atrial fibrillation  No Known Allergies  Patient Measurements: Height: 6\' 1"  (185.4 cm) Weight: 232 lb 14.4 oz (105.643 kg) IBW/kg (Calculated) : 79.9  Vital Signs: Temp: 98.1 F (36.7 C) (07/17 0457) Temp Source: Oral (07/17 0457) BP: 135/63 mmHg (07/17 0457) Pulse Rate: 56 (07/17 0457)  Labs:  Recent Labs  07/22/15 1730 07/22/15 2319 07/23/15 0512 07/23/15 1107 07/24/15 0342 07/24/15 1415 07/25/15 0348  HGB 7.6*  --  7.7*  --  8.8*  --   --   HCT 24.0*  --  24.1*  --  27.5*  --   --   PLT 226  --  215  --  211  --   --   LABPROT  --   --   --   --   --  15.7* 15.5*  INR  --   --   --   --   --  1.23 1.22  CREATININE 8.52*  --  8.41*  --  7.83*  --   --   TROPONINI  --  0.03* 0.03* <0.03  --   --   --     Estimated Creatinine Clearance: 13 mL/min (by C-G formula based on Cr of 7.83).  Assessment: 60 yo with a hx CKD who was admitted for weakness due to anemia. He was previously on Xarelto for afib but we are transitioning him to Coumadin now due to ESRD. His last dose of Xarelto was on 7/14.   Goal of Therapy:  INR 2-3 Monitor platelets by anticoagulation protocol: Yes   Plan:  Repeat Coumadin 7.5 mg po x 1 Daily INR  Thank you Anette Guarneri, PharmD [phone] 07/25/2015 8:49 AM

## 2015-07-25 NOTE — Progress Notes (Signed)
PROGRESS NOTE    Kevin James  [id number] DOB: 1955-05-01 DOA: 07/22/2015 PCP: Jerlyn Ly, MD  Brief Narrative: This is a 60 year old male with a history of known history of chronic kidney disease. His AV fistula was placed by Dr. Kellie Simmering May 24, it can be used by August 24. He is also recently been placed on IV iron every 2 weeks. His hemoglobin has been trending down. Over the past week and a half he has been very weak and cold. He is having increasing shortness of breath. He finally came to ER and his hemoglobin is now 7.6. He denies any chest pains. He has never had a colonoscopy. He is on the renal transplant list.  Renal consulted, HD cath placed, starting HD today  Assessment & Plan: Progressive CKD5 -has AVF, s/p Hd catheter, started HD 7/16 -a lot of symptoms of uremia -Renal following -xarelto stopped, not appropriate in ESRD  Anemia of chronic disease -progressive, Hb was 13.6 in 7/16 and 7.6 on admission -no overt blood loss noted, likely due to CKD -Anemia panel c/w chronic disease, ferritin low too, hemoccult pending -scheduled for Colonoscopy in Aug/2017 with [facility]  Obstructive sleep apnea on CPAP  -CPAP ordered  Hypertension  -Stable, continue verapamil -IV lopressor PRN  Paroxysmal atrial fibrillation  -Stable, continue verapamil -Xarelto stopped, started Warfarin 7/16 since procedures completed, i d/w pt and wife -i called and d/w Dr.Ross his cardiologist , who was ok with changing to Warfarin  Gout  -Continue colchicine  Diabetes mellitus  -hold Tradjenta, ADA diet, sliding-scale insulin  Dyslipidemia  -Stable, continue statin  DVt proph: started warfarin  Full Code Communication: wife at bedside Dispo: home when CLipping for HD complete and uremic symptoms better  Consultants:   Renal   Subjective: Feels ok, no new complaints  Objective: Filed Vitals:   07/24/15 2053 07/24/15 2251 07/25/15 0457 07/25/15 1000  BP: 138/66   135/63 163/69  Pulse: 70  56 57  Temp: 98.9 F (37.2 C)  98.1 F (36.7 C) 98 F (36.7 C)  TempSrc: Oral  Oral Oral  Resp: 17 18 18 18   Height:      Weight: 105.643 kg (232 lb 14.4 oz)     SpO2: 97%  93% 100%    Intake/Output Summary (Last 24 hours) at 07/25/15 1230 Last data filed at 07/25/15 0900  Gross per 24 hour  Intake    820 ml  Output      0 ml  Net    820 ml   Filed Weights   07/24/15 1319 07/24/15 1549 07/24/15 2053  Weight: 105.5 kg (232 lb 9.4 oz) 105.5 kg (232 lb 9.4 oz) 105.643 kg (232 lb 14.4 oz)    Examination:  General exam: Appears calm and comfortable, AAOx3 Respiratory system: diminished BS at bases Cardiovascular system: S1 & S2 heard, RRR. No JVD, murmurs, rubs, gallops or clicks. No pedal edema. Gastrointestinal system: Abdomen is nondistended, soft and nontender. No organomegaly or masses felt. Normal bowel Central nervous system: Alert and oriented. No focal neurological deficits. Extremities: Symmetric 5 x 5 power. Skin: No rashes, lesions or ulcers Psychiatry: Judgement and insight appear normal. Mood & affect appropriate.     Data Reviewed: I have personally reviewed following labs and imaging studies  CBC:  Recent Labs Lab 07/22/15 1730 07/23/15 0512 07/24/15 0342  WBC 7.8 8.6 6.8  HGB 7.6* 7.7* 8.8*  HCT 24.0* 24.1* 27.5*  MCV 93.8 91.6 91.1  PLT 226 215 211  Basic Metabolic Panel:  Recent Labs Lab 07/22/15 1730 07/23/15 0512 07/24/15 0342  NA 139 141 139  K 4.6 5.2* 4.7  CL 109 110 110  CO2 16* 16* 17*  GLUCOSE 106* 86 74  BUN 112* 105* 105*  CREATININE 8.52* 8.41* 7.83*  CALCIUM 9.0 9.2 8.6*  PHOS  --   --  9.8*   GFR: Estimated Creatinine Clearance: 13 mL/min (by C-G formula based on Cr of 7.83). Liver Function Tests:  Recent Labs Lab 07/24/15 0342  ALBUMIN 2.8*   No results for input(s): LIPASE, AMYLASE in the last 168 hours. No results for input(s): AMMONIA in the last 168 hours. Coagulation  Profile:  Recent Labs Lab 07/24/15 1415 07/25/15 0348  INR 1.23 1.22   Cardiac Enzymes:  Recent Labs Lab 07/22/15 2319 07/23/15 0512 07/23/15 1107  TROPONINI 0.03* 0.03* <0.03   BNP (last 3 results) No results for input(s): PROBNP in the last 8760 hours. HbA1C: No results for input(s): HGBA1C in the last 72 hours. CBG:  Recent Labs Lab 07/24/15 1140 07/24/15 1712 07/24/15 2051 07/25/15 0750 07/25/15 1150  GLUCAP 111* 70 142* 98 87   Lipid Profile: No results for input(s): CHOL, HDL, LDLCALC, TRIG, CHOLHDL, LDLDIRECT in the last 72 hours. Thyroid Function Tests: No results for input(s): TSH, T4TOTAL, FREET4, T3FREE, THYROIDAB in the last 72 hours. Anemia Panel:  Recent Labs  07/23/15 1107  VITAMINB12 584  FOLATE 17.0  FERRITIN 12*  TIBC 336  IRON 147  RETICCTPCT 3.3*   Urine analysis:    Component Value Date/Time   COLORURINE YELLOW 07/23/2015 1522   APPEARANCEUR CLEAR 07/23/2015 1522   LABSPEC 1.013 07/23/2015 1522   PHURINE 6.0 07/23/2015 1522   GLUCOSEU 100* 07/23/2015 1522   HGBUR SMALL* 07/23/2015 1522   BILIRUBINUR NEGATIVE 07/23/2015 1522   KETONESUR NEGATIVE 07/23/2015 1522   PROTEINUR >300* 07/23/2015 1522   NITRITE NEGATIVE 07/23/2015 1522   LEUKOCYTESUR NEGATIVE 07/23/2015 1522   Sepsis Labs: @LABRCNTIP (procalcitonin:4,lacticidven:4)  ) Recent Results (from the past 240 hour(s))  Surgical pcr screen     Status: None   Collection Time: 07/23/15  8:44 PM  Result Value Ref Range Status   MRSA, PCR NEGATIVE NEGATIVE Final   Staphylococcus aureus NEGATIVE NEGATIVE Final    Comment:        The Xpert SA Assay (FDA approved for NASAL specimens in patients over 68 years of age), is one component of a comprehensive surveillance program.  Test performance has been validated by [facility] for patients greater than or equal to 71 year old. It is not intended to diagnose infection nor to guide or monitor treatment.           Radiology Studies: Dg Chest Port 1 View  07/24/2015  CLINICAL DATA:  Insertion of dialysis catheter done in OR 11 Fluoro time = 20.0 seconds; ESRD; s/p new diatek placement EXAM: CHEST 1 VIEW COMPARISON:  07/22/2015 FINDINGS: Right-sided dialysis catheter has been placed, tip overlying the level of the superior vena cava. The heart size is normal. No pneumothorax. Prominent thoracic osteophytes are again identified. IMPRESSION: Interval placement of right-sided dialysis catheter, tip overlying the level of superior vena cava. Electronically Signed   By: Nolon Nations M.D.   On: 07/24/2015 11:21   Dg Fluoro Guide Cv Line-no Report  07/24/2015  CLINICAL DATA:  FLOURO GUIDE CV LINE Fluoroscopy was utilized by the requesting physician.  No radiographic interpretation.        Scheduled Meds: . atorvastatin  80 mg Oral q1800  . calcium acetate  1,334 mg Oral TID WC  . colchicine  0.6 mg Oral Daily  . [START ON 07/30/2015] darbepoetin (ARANESP) injection - DIALYSIS  100 mcg Intravenous Q Sat-HD  . docusate sodium  100 mg Oral BID  . feeding supplement (NEPRO CARB STEADY)  237 mL Oral Q24H  . insulin aspart  0-15 Units Subcutaneous TID WC  . insulin aspart  0-5 Units Subcutaneous QHS  . sodium chloride flush  3 mL Intravenous Q12H  . verapamil  360 mg Oral Q lunch  . warfarin  7.5 mg Oral ONCE-1800  . Warfarin - Pharmacist Dosing Inpatient   Does not apply q1800   Continuous Infusions:    LOS: 3 days    Time spent: 67min    Domenic Polite, MD [facility] Pager [phone]  If 7PM-7AM, please contact night-coverage [email or web address] Password [email or web address] 07/25/2015, 12:30 PM

## 2015-07-26 ENCOUNTER — Ambulatory Visit: Payer: BLUE CROSS/BLUE SHIELD | Admitting: Neurology

## 2015-07-26 LAB — RENAL FUNCTION PANEL
ANION GAP: 10 (ref 5–15)
Albumin: 2.9 g/dL — ABNORMAL LOW (ref 3.5–5.0)
BUN: 45 mg/dL — ABNORMAL HIGH (ref 6–20)
CHLORIDE: 104 mmol/L (ref 101–111)
CO2: 24 mmol/L (ref 22–32)
Calcium: 8.6 mg/dL — ABNORMAL LOW (ref 8.9–10.3)
Creatinine, Ser: 5.24 mg/dL — ABNORMAL HIGH (ref 0.61–1.24)
GFR, EST AFRICAN AMERICAN: 13 mL/min — AB (ref >60)
GFR, EST NON AFRICAN AMERICAN: 11 mL/min — AB (ref >60)
Glucose, Bld: 86 mg/dL (ref 65–99)
POTASSIUM: 4 mmol/L (ref 3.5–5.1)
Phosphorus: 6.3 mg/dL — ABNORMAL HIGH (ref 2.5–4.6)
Sodium: 138 mmol/L (ref 135–145)

## 2015-07-26 LAB — CBC
HEMATOCRIT: 31.2 % — AB (ref 39.0–52.0)
HEMOGLOBIN: 10 g/dL — AB (ref 13.0–17.0)
MCH: 29 pg (ref 26.0–34.0)
MCHC: 32.1 g/dL (ref 30.0–36.0)
MCV: 90.4 fL (ref 78.0–100.0)
PLATELETS: 238 10*3/uL (ref 150–400)
RBC: 3.45 MIL/uL — AB (ref 4.22–5.81)
RDW: 14.1 % (ref 11.5–15.5)
WBC: 8.1 10*3/uL (ref 4.0–10.5)

## 2015-07-26 LAB — PROTIME-INR
INR: 1.54 — AB (ref 0.00–1.49)
Prothrombin Time: 18.5 seconds — ABNORMAL HIGH (ref 11.6–15.2)

## 2015-07-26 LAB — GLUCOSE, CAPILLARY
GLUCOSE-CAPILLARY: 140 mg/dL — AB (ref 65–99)
Glucose-Capillary: 158 mg/dL — ABNORMAL HIGH (ref 65–99)
Glucose-Capillary: 137 mg/dL — ABNORMAL HIGH (ref 65–99)

## 2015-07-26 LAB — PARATHYROID HORMONE, INTACT (NO CA): PTH: 185 pg/mL — ABNORMAL HIGH (ref 15–65)

## 2015-07-26 MED ORDER — WARFARIN SODIUM 7.5 MG PO TABS
7.5000 mg | ORAL_TABLET | Freq: Once | ORAL | Status: AC
  Administered 2015-07-26: 7.5 mg via ORAL
  Filled 2015-07-26: qty 1

## 2015-07-26 MED ORDER — HEPARIN SODIUM (PORCINE) 1000 UNIT/ML DIALYSIS: 20.0000 [IU]/kg | INTRAMUSCULAR | Status: DC | PRN

## 2015-07-26 MED ORDER — SODIUM CHLORIDE 0.9 % IV SOLN
125.0000 mg | Freq: Every day | INTRAVENOUS | Status: DC
  Administered 2015-07-26 – 2015-07-27 (×2): 125 mg via INTRAVENOUS
  Filled 2015-07-26 (×3): qty 10

## 2015-07-26 NOTE — Progress Notes (Signed)
Patient refused CPAP for tonight. RT made pt aware that if he changed his mind to call. Family at bedside.

## 2015-07-26 NOTE — Progress Notes (Addendum)
Patient ID: Kevin James, male   DOB: [birth year], 60 y.o.   MRN: [id number] S: Patient reports no complaints this morning. Did note feeling hot towards the end of dialysis session. s/p HD catheter 7/16 and HD 7/16 and 7/17   O:BP 121/66 mmHg  Pulse 70  Temp(Src) 98 F (36.7 C) (Oral)  Resp 16  Ht 6\' 1"  (1.854 m)  Wt 101.2 kg (223 lb 1.7 oz)  BMI 29.44 kg/m2  SpO2 98%  Intake/Output Summary (Last 24 hours) at 07/26/15 1121 Last data filed at 07/26/15 1108  Gross per 24 hour  Intake    220 ml  Output   3750 ml  Net  -3530 ml   Intake/Output: I/O last 3 completed shifts: In: 1020 [P.O.:1020] Out: 1750 [Urine:750; Other:1000]  Intake/Output this shift:  Total I/O In: 0  Out: 2000 [Other:2000] Weight change: -1 kg (-2 lb 3.3 oz) Gen:WD WN WM lethargic CVS:no rub Resp:occ rhonchi [id number] Ext:no edema, L AVF +T/B, tunneled HD cath right subclavian    Recent Labs Lab 07/22/15 1730 07/23/15 0512 07/24/15 0342 07/26/15 0703  NA 139 141 139 138  K 4.6 5.2* 4.7 4.0  CL 109 110 110 104  CO2 16* 16* 17* 24  GLUCOSE 106* 86 74 86  BUN 112* 105* 105* 45*  CREATININE 8.52* 8.41* 7.83* 5.24*  ALBUMIN  --   --  2.8* 2.9*  CALCIUM 9.0 9.2 8.6* 8.6*  PHOS  --   --  9.8* 6.3*   Liver Function Tests:  Recent Labs Lab 07/24/15 0342 07/26/15 0703  ALBUMIN 2.8* 2.9*   CBC:  Recent Labs Lab 07/22/15 1730 07/23/15 0512 07/24/15 0342 07/26/15 0704  WBC 7.8 8.6 6.8 8.1  HGB 7.6* 7.7* 8.8* 10.0*  HCT 24.0* 24.1* 27.5* 31.2*  MCV 93.8 91.6 91.1 90.4  PLT 226 215 211 238   Cardiac Enzymes:  Recent Labs Lab 07/22/15 2319 07/23/15 0512 07/23/15 1107  TROPONINI 0.03* 0.03* <0.03   CBG:  Recent Labs Lab 07/24/15 2051 07/25/15 0750 07/25/15 1150 07/25/15 1653 07/25/15 2039  GLUCAP 142* 98 87 93 107*    Iron Studies:   Recent Labs  07/25/15 1400  IRON 37*  TIBC 328  FERRITIN 22*   Studies/Results: No results found. Marland Kitchen atorvastatin  80 mg Oral  q1800  . calcium acetate  1,334 mg Oral TID WC  . [START ON 07/30/2015] darbepoetin (ARANESP) injection - DIALYSIS  100 mcg Intravenous Q Sat-HD  . docusate sodium  100 mg Oral BID  . feeding supplement (NEPRO CARB STEADY)  237 mL Oral BID BM  . insulin aspart  0-15 Units Subcutaneous TID WC  . insulin aspart  0-5 Units Subcutaneous QHS  . sodium chloride flush  3 mL Intravenous Q12H  . verapamil  360 mg Oral Q lunch  . Warfarin - Pharmacist Dosing Inpatient   Does not apply q1800    BMET    Component Value Date/Time   NA 138 07/26/2015 0703   K 4.0 07/26/2015 0703   CL 104 07/26/2015 0703   CO2 24 07/26/2015 0703   GLUCOSE 86 07/26/2015 0703   BUN 45* 07/26/2015 0703   CREATININE 5.24* 07/26/2015 0703   CALCIUM 8.6* 07/26/2015 0703   GFRNONAA 11* 07/26/2015 0703   GFRAA 13* 07/26/2015 0703   CBC    Component Value Date/Time   WBC 8.1 07/26/2015 0704   RBC 3.45* 07/26/2015 0704   RBC 2.68* 07/23/2015 1107   HGB 10.0* 07/26/2015 [id number]  HCT 31.2* 07/26/2015 0704   PLT 238 07/26/2015 0704   MCV 90.4 07/26/2015 0704   MCH 29.0 07/26/2015 0704   MCHC 32.1 07/26/2015 0704   RDW 14.1 07/26/2015 0704   LYMPHSABS 2.3 07/28/2014 0937   MONOABS 0.7 07/28/2014 0937   EOSABS 0.3 07/28/2014 0937   BASOSABS 0.1 07/28/2014 0937    Assessment/Plan: 1. Progressive CKD now at stage 5. s/p placement of HD catheter. Underwent HD 7/16, 7/17 with 3rd session today. CLIP process started, although difficult since he lives in [place], [place]. He would like to pursue home therapies once stabilized. Says can do [place] because wants to stay with [facility]- transportation may be difficult.  I think he will eventually be able to drive but not right off and I also think could do Nx stage but will need to have AVF working ideally before trained. Has no edema present today at the end of his dialysis session with BP soft. Likely reached his dry weight today - 101 kg. Plan for next HD for Thursday   2. Anemia  of chronic disease 1. S/p one unit of PRBC with appropriate bump in hgb 2. Started Aranesp. Hgb stable at 10.0 today. Iron stores low- will replete                                                                                                     3. CKD-BMD- Ca 8.6 (9.5 corrected). Phos improving 9.8 > 6.3. iPTH pending. Will follow calcium, phos, iPTH.  On phoslo - PTH was 185- no vit D needed  4.  5. Vascular access- LAVF placed 06/01/15 and s/p tunneled HD catheter by Dr. Bridgett Larsson on 07/24/15 (appreciate his assistance) 6.  7. N/V/anorexia- resolved. Likely 2/2 to uremia on presentation. 8. SOB- Resolved. presumably due to anemia 9. Hyperkalemia- Resolved. K 4.0 today.  10. Deconditioning- will benefit from PT/OT  Maryellen Pile  [facility] PGY-2 [phone]  Patient seen and examined, agree with above note with above modifications. Doing pretty well s/p his third HD treatment- EDW established - all labs improving.  Hopefully will be able to get him established in [place] as OP.  Next HD Thursday   Corliss Parish, MD 07/26/2015

## 2015-07-26 NOTE — Procedures (Signed)
Patient was seen on dialysis and the procedure was supervised.  BFR 300  Via PC BP is  97/42.   Patient appears to be tolerating treatment well  [place]endale Youngblood A 07/26/2015

## 2015-07-26 NOTE — Progress Notes (Signed)
Pt refuseing CPAP tonight. Stated he is waiting for his home unit

## 2015-07-26 NOTE — Progress Notes (Signed)
ANTICOAGULATION CONSULT NOTE  Pharmacy Consult for Coumadin Indication: atrial fibrillation  No Known Allergies  Patient Measurements: Height: 6\' 1"  (185.4 cm) Weight: 223 lb 1.7 oz (101.2 kg) (Standing scale) IBW/kg (Calculated) : 79.9  Vital Signs: Temp: 98 F (36.7 C) (07/18 1030) Temp Source: Oral (07/18 1030) BP: 121/66 mmHg (07/18 1030) Pulse Rate: 70 (07/18 1030)  Labs:  Recent Labs  07/24/15 0342 07/24/15 1415 07/25/15 0348 07/26/15 0537 07/26/15 0703 07/26/15 0704  HGB 8.8*  --   --   --   --  10.0*  HCT 27.5*  --   --   --   --  31.2*  PLT 211  --   --   --   --  238  LABPROT  --  15.7* 15.5* 18.5*  --   --   INR  --  1.23 1.22 1.54*  --   --   CREATININE 7.83*  --   --   --  5.24*  --     Estimated Creatinine Clearance: 19 mL/min (by C-G formula based on Cr of 5.24).  Assessment: 60 yo with a hx CKD who was admitted for weakness due to anemia. He was previously on Xarelto for afib but we are transitioning him to Coumadin now due to ESRD. His last dose of Xarelto was on 7/14.   INR today trending up = 1.53  Goal of Therapy:  INR 2-3 Monitor platelets by anticoagulation protocol: Yes   Plan:  Repeat Coumadin 7.5 mg po x 1 Daily INR  Thank you Anette Guarneri, PharmD [phone] 07/26/2015 11:27 AM

## 2015-07-26 NOTE — Discharge Instructions (Addendum)
Information on my medicine - Coumadin   (Warfarin) You must see your primary doctor in 1-2 days to have PT/INR check for Coumadin monitoring    This medication education was reviewed with me or my healthcare representative as part of my discharge preparation.  The pharmacist that spoke with me during my hospital stay was:  Tad Moore, [facility]  Why was Coumadin prescribed for you? Coumadin was prescribed for you because you have a blood clot or a medical condition that can cause an increased risk of forming blood clots. Blood clots can cause serious health problems by blocking the flow of blood to the heart, lung, or brain. Coumadin can prevent harmful blood clots from forming. As a reminder your indication for Coumadin is:   Stroke Prevention Because Of Atrial Fibrillation  What test will check on my response to Coumadin? While on Coumadin (warfarin) you will need to have an INR test regularly to ensure that your dose is keeping you in the desired range. The INR (international normalized ratio) number is calculated from the result of the laboratory test called prothrombin time (PT).  If an INR APPOINTMENT HAS NOT ALREADY BEEN MADE FOR YOU please schedule an appointment to have this lab work done by your health care provider within 7 days. Your INR goal is usually a number between:  2 to 3 or your provider may give you a more narrow range like 2-2.5.  Ask your health care provider during an office visit what your goal INR is.  What  do you need to  know  About  COUMADIN? Take Coumadin (warfarin) exactly as prescribed by your healthcare provider about the same time each day.  DO NOT stop taking without talking to the doctor who prescribed the medication.  Stopping without other blood clot prevention medication to take the place of Coumadin may increase your risk of developing a new clot or stroke.  Get refills before you run out.  What do you do if you miss a dose? If you miss a dose, take it as  soon as you remember on the same day then continue your regularly scheduled regimen the next day.  Do not take two doses of Coumadin at the same time.  Important Safety Information A possible side effect of Coumadin (Warfarin) is an increased risk of bleeding. You should call your healthcare provider right away if you experience any of the following: ? Bleeding from an injury or your nose that does not stop. ? Unusual colored urine (red or dark brown) or unusual colored stools (red or black). ? Unusual bruising for unknown reasons. ? A serious fall or if you hit your head (even if there is no bleeding).  Some foods or medicines interact with Coumadin (warfarin) and might alter your response to warfarin. To help avoid this: ? Eat a balanced diet, maintaining a consistent amount of Vitamin K. ? Notify your provider about major diet changes you plan to make. ? Avoid alcohol or limit your intake to 1 drink for women and 2 drinks for men per day. (1 drink is 5 oz. wine, 12 oz. beer, or 1.5 oz. liquor.)  Make sure that ANY health care provider who prescribes medication for you knows that you are taking Coumadin (warfarin).  Also make sure the healthcare provider who is monitoring your Coumadin knows when you have started a new medication including herbals and non-prescription products.  Coumadin (Warfarin)  Major Drug Interactions  Increased Warfarin Effect Decreased Warfarin Effect  Alcohol (large quantities) Antibiotics (esp. Septra/Bactrim, Flagyl, Cipro) Amiodarone (Cordarone) Aspirin (ASA) Cimetidine (Tagamet) Megestrol (Megace) NSAIDs (ibuprofen, naproxen, etc.) Piroxicam (Feldene) Propafenone (Rythmol SR) Propranolol (Inderal) Isoniazid (INH) Posaconazole (Noxafil) Barbiturates (Phenobarbital) Carbamazepine (Tegretol) Chlordiazepoxide (Librium) Cholestyramine (Questran) Griseofulvin Oral Contraceptives Rifampin Sucralfate (Carafate) Vitamin K   Coumadin (Warfarin) Major  Herbal Interactions  Increased Warfarin Effect Decreased Warfarin Effect  Garlic Ginseng Ginkgo biloba Coenzyme Q10 Green tea St. Johns wort    Coumadin (Warfarin) FOOD Interactions  Eat a consistent number of servings per week of foods HIGH in Vitamin K (1 serving =  cup)  Collards (cooked, or boiled & drained) Kale (cooked, or boiled & drained) Mustard greens (cooked, or boiled & drained) Parsley *serving size only =  cup Spinach (cooked, or boiled & drained) Swiss chard (cooked, or boiled & drained) Turnip greens (cooked, or boiled & drained)  Eat a consistent number of servings per week of foods MEDIUM-HIGH in Vitamin K (1 serving = 1 cup)  Asparagus (cooked, or boiled & drained) Broccoli (cooked, boiled & drained, or raw & chopped) Brussel sprouts (cooked, or boiled & drained) *serving size only =  cup Lettuce, raw (green leaf, endive, romaine) Spinach, raw Turnip greens, raw & chopped   These websites have more information on Coumadin (warfarin):  FailFactory.se; VeganReport.com.au;

## 2015-07-26 NOTE — Progress Notes (Signed)
PROGRESS NOTE    Kevin James  [id number] DOB: [birth year] DOA: 07/22/2015 PCP: Jerlyn Ly, MD  Brief Narrative: This is a 60 year old male with a history of known history of chronic kidney disease. His AV fistula was placed by Dr. Kellie Simmering May 24, anemia of chronic disease, admitted with progressive weakness and dyspnea, found to have worsening AKI and Hb of 7.6 Renal consulted, HD cath placed, started HD and got 2 Unit PRBC this admission  Assessment & Plan: Progressive CKD5 -has AVF, s/p Hd catheter, started HD 7/16 -a lot of symptoms of uremia -Renal following -xarelto stopped, not appropriate in ESRD -CLIP for HD initiated yesterday  Anemia of chronic disease -progressive, Hb was 13.6 in 7/16 and 7.6 on admission -no overt blood loss noted, likely due to CKD -Anemia panel c/w chronic disease, ferritin low too, hemoccult pending -scheduled for Colonoscopy in Aug/2017 with [facility]  Obstructive sleep apnea on CPAP  -CPAP ordered  Hypertension  -Stable, continue verapamil -IV lopressor PRN  Paroxysmal atrial fibrillation  -Stable, continue verapamil -Xarelto stopped, started Warfarin 7/16 since procedures completed, i d/w pt and wife -i called and d/w Dr.Ross his cardiologist , who was ok with changing to Warfarin, INR subtherapeutic  Gout  -Continue colchicine  Diabetes mellitus  -hold Tradjenta, ADA diet, sliding-scale insulin  Dyslipidemia  -Stable, continue statin  DVt proph: started warfarin  Full Code Communication: wife at bedside Dispo: home when CLipping for HD complete and uremic symptoms better  Consultants:   Renal   Subjective: Feels ok, no new complaints, seen on HD  Objective: Filed Vitals:   07/26/15 0928 07/26/15 0959 07/26/15 1030 07/26/15 1135  BP: 96/44 120/62 121/66 112/65  Pulse: 72 70 70 66  Temp:   98 F (36.7 C) 98.2 F (36.8 C)  TempSrc:   Oral Oral  Resp: 12 19 16 17   Height:      Weight:   101.2 kg (223 lb 1.7  oz)   SpO2:   98% 96%    Intake/Output Summary (Last 24 hours) at 07/26/15 1138 Last data filed at 07/26/15 1135  Gross per 24 hour  Intake    460 ml  Output   3750 ml  Net  -3290 ml   Filed Weights   07/26/15 0642 07/26/15 0650 07/26/15 1030  Weight: 103.4 kg (227 lb 15.3 oz) 103.4 kg (227 lb 15.3 oz) 101.2 kg (223 lb 1.7 oz)    Examination:  General exam: Appears calm and comfortable, AAOx3 Respiratory system: diminished BS at bases Cardiovascular system: S1 & S2 heard, RRR. No JVD, murmurs, rubs, gallops or clicks. No pedal edema. Gastrointestinal system: Abdomen is nondistended, soft and nontender. No organomegaly or masses felt. Normal bowel Central nervous system: Alert and oriented. No focal neurological deficits. Extremities: Symmetric 5 x 5 power. Skin: No rashes, lesions or ulcers Psychiatry: Judgement and insight appear normal. Mood & affect appropriate.     Data Reviewed: I have personally reviewed following labs and imaging studies  CBC:  Recent Labs Lab 07/22/15 1730 07/23/15 0512 07/24/15 0342 07/26/15 0704  WBC 7.8 8.6 6.8 8.1  HGB 7.6* 7.7* 8.8* 10.0*  HCT 24.0* 24.1* 27.5* 31.2*  MCV 93.8 91.6 91.1 90.4  PLT 226 215 211 [id number]   Basic Metabolic Panel:  Recent Labs Lab 07/22/15 1730 07/23/15 0512 07/24/15 0342 07/26/15 0703  NA 139 141 139 138  K 4.6 5.2* 4.7 4.0  CL 109 110 110 104  CO2 16* 16* 17* 24  GLUCOSE  106* 86 74 86  BUN 112* 105* 105* 45*  CREATININE 8.52* 8.41* 7.83* 5.24*  CALCIUM 9.0 9.2 8.6* 8.6*  PHOS  --   --  9.8* 6.3*   GFR: Estimated Creatinine Clearance: 19 mL/min (by C-G formula based on Cr of 5.24). Liver Function Tests:  Recent Labs Lab 07/24/15 0342 07/26/15 0703  ALBUMIN 2.8* 2.9*   No results for input(s): LIPASE, AMYLASE in the last 168 hours. No results for input(s): AMMONIA in the last 168 hours. Coagulation Profile:  Recent Labs Lab 07/24/15 1415 07/25/15 0348 07/26/15 0537  INR 1.23 1.22  1.54*   Cardiac Enzymes:  Recent Labs Lab 07/22/15 2319 07/23/15 0512 07/23/15 1107  TROPONINI 0.03* 0.03* <0.03   BNP (last 3 results) No results for input(s): PROBNP in the last 8760 hours. HbA1C: No results for input(s): HGBA1C in the last 72 hours. CBG:  Recent Labs Lab 07/24/15 2051 07/25/15 0750 07/25/15 1150 07/25/15 1653 07/25/15 2039  GLUCAP 142* 98 87 93 107*   Lipid Profile: No results for input(s): CHOL, HDL, LDLCALC, TRIG, CHOLHDL, LDLDIRECT in the last 72 hours. Thyroid Function Tests: No results for input(s): TSH, T4TOTAL, FREET4, T3FREE, THYROIDAB in the last 72 hours. Anemia Panel:  Recent Labs  07/25/15 1400  FERRITIN 22*  TIBC 328  IRON 37*   Urine analysis:    Component Value Date/Time   COLORURINE YELLOW 07/23/2015 1522   APPEARANCEUR CLEAR 07/23/2015 1522   LABSPEC 1.013 07/23/2015 1522   PHURINE 6.0 07/23/2015 1522   GLUCOSEU 100* 07/23/2015 1522   HGBUR SMALL* 07/23/2015 1522   BILIRUBINUR NEGATIVE 07/23/2015 1522   [place] 07/23/2015 1522   PROTEINUR >300* 07/23/2015 1522   NITRITE NEGATIVE 07/23/2015 1522   LEUKOCYTESUR NEGATIVE 07/23/2015 1522   Sepsis Labs: @LABRCNTIP (procalcitonin:4,lacticidven:4)  ) Recent Results (from the past 240 hour(s))  Surgical pcr screen     Status: None   Collection Time: 07/23/15  8:44 PM  Result Value Ref Range Status   MRSA, PCR NEGATIVE NEGATIVE Final   Staphylococcus aureus NEGATIVE NEGATIVE Final    Comment:        The Xpert SA Assay (FDA approved for NASAL specimens in patients over 58 years of age), is one component of a comprehensive surveillance program.  Test performance has been validated by [facility] for patients greater than or equal to 20 year old. It is not intended to diagnose infection nor to guide or monitor treatment.          Radiology Studies: No results found.      Scheduled Meds: . atorvastatin  80 mg Oral q1800  . calcium acetate   1,334 mg Oral TID WC  . [START ON 07/30/2015] darbepoetin (ARANESP) injection - DIALYSIS  100 mcg Intravenous Q Sat-HD  . docusate sodium  100 mg Oral BID  . feeding supplement (NEPRO CARB STEADY)  237 mL Oral BID BM  . insulin aspart  0-15 Units Subcutaneous TID WC  . insulin aspart  0-5 Units Subcutaneous QHS  . sodium chloride flush  3 mL Intravenous Q12H  . verapamil  360 mg Oral Q lunch  . warfarin  7.5 mg Oral ONCE-1800  . Warfarin - Pharmacist Dosing Inpatient   Does not apply q1800   Continuous Infusions:    LOS: 4 days    Time spent: 51min    Domenic Polite, MD [facility] Pager [phone]  If 7PM-7AM, please contact night-coverage [email or web address] Password [facility] 07/26/2015, 11:38 AM

## 2015-07-27 DIAGNOSIS — N179 Acute kidney failure, unspecified: Secondary | ICD-10-CM

## 2015-07-27 DIAGNOSIS — N189 Chronic kidney disease, unspecified: Secondary | ICD-10-CM

## 2015-07-27 LAB — BASIC METABOLIC PANEL
Anion gap: 11 (ref 5–15)
BUN: 41 mg/dL — AB (ref 6–20)
CO2: 26 mmol/L (ref 22–32)
Calcium: 9 mg/dL (ref 8.9–10.3)
Chloride: 98 mmol/L — ABNORMAL LOW (ref 101–111)
Creatinine, Ser: 5.48 mg/dL — ABNORMAL HIGH (ref 0.61–1.24)
GFR calc Af Amer: 12 mL/min — ABNORMAL LOW (ref >60)
GFR, EST NON AFRICAN AMERICAN: 10 mL/min — AB (ref >60)
GLUCOSE: 105 mg/dL — AB (ref 65–99)
POTASSIUM: 3.9 mmol/L (ref 3.5–5.1)
Sodium: 135 mmol/L (ref 135–145)

## 2015-07-27 LAB — CBC
HCT: 33 % — ABNORMAL LOW (ref 39.0–52.0)
Hemoglobin: 10.8 g/dL — ABNORMAL LOW (ref 13.0–17.0)
MCH: 29.6 pg (ref 26.0–34.0)
MCHC: 32.7 g/dL (ref 30.0–36.0)
MCV: 90.4 fL (ref 78.0–100.0)
PLATELETS: 244 10*3/uL (ref 150–400)
RBC: 3.65 MIL/uL — AB (ref 4.22–5.81)
RDW: 14.1 % (ref 11.5–15.5)
WBC: 9.8 10*3/uL (ref 4.0–10.5)

## 2015-07-27 LAB — PROTIME-INR
INR: 2.16 — AB (ref 0.00–1.49)
Prothrombin Time: 23.9 seconds — ABNORMAL HIGH (ref 11.6–15.2)

## 2015-07-27 LAB — GLUCOSE, CAPILLARY
GLUCOSE-CAPILLARY: 93 mg/dL (ref 65–99)
GLUCOSE-CAPILLARY: 115 mg/dL — AB (ref 65–99)
GLUCOSE-CAPILLARY: 90 mg/dL (ref 65–99)

## 2015-07-27 MED ORDER — WARFARIN SODIUM 2.5 MG PO TABS: 2.5000 mg | ORAL_TABLET | Freq: Once | ORAL | Status: DC

## 2015-07-27 MED ORDER — SEVELAMER CARBONATE 800 MG PO TABS
1600.0000 mg | ORAL_TABLET | Freq: Three times a day (TID) | ORAL | Status: DC
  Administered 2015-07-27: 1600 mg via ORAL
  Filled 2015-07-27 (×2): qty 2

## 2015-07-27 MED ORDER — PATIENT'S GUIDE TO USING COUMADIN BOOK
Freq: Once | Status: DC
  Filled 2015-07-27: qty 1

## 2015-07-27 MED ORDER — WARFARIN SODIUM 2.5 MG PO TABS
2.5000 mg | ORAL_TABLET | Freq: Once | ORAL | Status: AC
  Administered 2015-07-27: 2.5 mg via ORAL
  Filled 2015-07-27: qty 1

## 2015-07-27 MED ORDER — SEVELAMER CARBONATE 800 MG PO TABS: 1600.0000 mg | ORAL_TABLET | Freq: Three times a day (TID) | ORAL | Status: DC

## 2015-07-27 NOTE — Plan of Care (Signed)
Problem: Health Behavior/Discharge Planning: Goal: Ability to manage health-related needs will improve Outcome: Completed/Met Date Met:  07/27/15 Outpatient dialysis arranged by HD secretary.   Problem: Tissue Perfusion: Goal: Risk factors for ineffective tissue perfusion will decrease Outcome: Completed/Met Date Met:  07/27/15 Patient discharged on PO coumadin.  Problem: Fluid Volume: Goal: Ability to maintain a balanced intake and output will improve Outcome: Completed/Met Date Met:  07/27/15 Reminded patient about renal fluid restriction.  Problem: Nutrition: Goal: Adequate nutrition will be maintained Outcome: Completed/Met Date Met:  07/27/15 Information given on renal diet.  Problem: Education: Goal: Knowledge of disease and its progression will improve Outcome: Completed/Met Date Met:  07/27/15 Handouts given, videos watched on education network.  Problem: Health Behavior/Discharge Planning: Goal: Ability to manage health-related needs will improve Outcome: Completed/Met Date Met:  07/27/15 Outpatient dialysis arranged.  Problem: Physical Regulation: Goal: Dialysis access will remain free of complications Outcome: Completed/Met Date Met:  07/27/15 Educated on monitoring for s/s infection of TDC site.   Problem: Activity: Goal: Activity intolerance will improve Outcome: Completed/Met Date Met:  07/27/15 Able to sit up in chair without issue.  Problem: Fluid Volume: Goal: Fluid volume balance will be maintained or improved Outcome: Completed/Met Date Met:  07/27/15 Fluid restriction encouraged.  Problem: Nutritional: Goal: Ability to make appriopriate dietary choices will improve Outcome: Completed/Met Date Met:  07/27/15 Given education handouts on renal diet.

## 2015-07-27 NOTE — Progress Notes (Addendum)
ANTICOAGULATION CONSULT NOTE  Pharmacy Consult for Coumadin Indication: atrial fibrillation  No Known Allergies  Patient Measurements: Height: 6\' 1"  (185.4 cm) Weight: 224 lb (101.606 kg) IBW/kg (Calculated) : 79.9  Vital Signs: Temp: 98.5 F (36.9 C) (07/19 0455) Temp Source: Oral (07/19 0455) BP: 131/81 mmHg (07/19 0455) Pulse Rate: 48 (07/19 0455)  Labs:  Recent Labs  07/25/15 0348 07/26/15 0537 07/26/15 0703 07/26/15 0704 07/27/15 0506  HGB  --   --   --  10.0* 10.8*  HCT  --   --   --  31.2* 33.0*  PLT  --   --   --  238 244  LABPROT 15.5* 18.5*  --   --  23.9*  INR 1.22 1.54*  --   --  2.16*  CREATININE  --   --  5.24*  --  5.48*    Estimated Creatinine Clearance: 18.2 mL/min (by C-G formula based on Cr of 5.48).  Assessment: 60 yo with a hx CKD who was admitted for weakness due to anemia. He was previously on Xarelto for afib but we are transitioning him to Coumadin now due to ESRD. His last dose of Xarelto was on 7/14.   INR today trending up = 2.16  Goal of Therapy:  INR 2-3 Monitor platelets by anticoagulation protocol: Yes   Plan:  Coumadin 2.5 mg x1 Daily INR  Arrie Senate, PharmD PGY-1 Resident 07/27/2015   Agree with above Thank you Anette Guarneri, PharmD [phone]

## 2015-07-27 NOTE — Evaluation (Signed)
Physical Therapy Evaluation Patient Details Name: Kevin James MRN: [id number] DOB: 1955-10-11 Today's Date: 07/27/2015   History of Present Illness  60 yo male with acute on chronic renal failiure, PMHx:  DM, OSA, neuropathy, CKD 5, HD, gout, PAF  Clinical Impression  Has been able to try steps today with min guard help and somewhat impulsive presentation of the task.  Has family assistance for mobility and hopefully will follow instructions from family to walk with help.  Pt is scheduled for HHPT and so will persist with inpt acute hospital care if he is not discharged today to home.    Follow Up Recommendations Home health PT;Supervision for mobility/OOB    Equipment Recommendations  Rolling walker with 5" wheels    Recommendations for Other Services Rehab consult     Precautions / Restrictions Precautions Precautions: Fall (telemetry) Restrictions Weight Bearing Restrictions: No      Mobility  Bed Mobility Overal bed mobility: Modified Independent                Transfers Overall transfer level: Modified independent Equipment used: Rolling walker (2 wheeled);1 person hand held assist             General transfer comment: cued hand hold assist sequence  Ambulation/Gait Ambulation/Gait assistance: Min guard;Min assist Ambulation Distance (Feet): 100 Feet (x 2) Assistive device: Rolling walker (2 wheeled);1 person hand held assist Gait Pattern/deviations: Step-through pattern;Step-to pattern;Wide base of support;Ataxic Gait velocity: reduced Gait velocity interpretation: Below normal speed for age/gender General Gait Details: pt drifts and struggles to control steps even on a walker, listing to the left at times  Stairs Stairs: Yes Stairs assistance: Min guard Stair Management: Two rails;Forwards;Alternating pattern Number of Stairs: 4 General stair comments: replicated task for home, discussed with his wife  Wheelchair Mobility    Modified  [place] (Stroke Patients Only)       Balance                                             Pertinent Vitals/Pain Pain Assessment: No/denies pain    Home Living Family/patient expects to be discharged to:: Private residence Living Arrangements: Spouse/significant other Available Help at Discharge: Family;Available 24 hours/day Type of Home: House Home Access: Stairs to enter Entrance Stairs-Rails: Right;Left;Can reach both Entrance Stairs-Number of Steps: 4 Home Layout: One level Home Equipment: Cane - single point Additional Comments: pt reports he doesn't want to use a walker but then agreed     Prior Function Level of Independence: Independent               Hand Dominance        Extremity/Trunk Assessment   Upper Extremity Assessment: Overall WFL for tasks assessed           Lower Extremity Assessment: Overall WFL for tasks assessed      Cervical / Trunk Assessment: Normal  Communication   Communication: No difficulties  Cognition Arousal/Alertness: Awake/alert Behavior During Therapy: Impulsive Overall Cognitive Status: Within Functional Limits for tasks assessed                      General Comments General comments (skin integrity, edema, etc.): Pt is hoping to go home with no additional services and equipment but discussed with he and wife his need for walker and HHPT.  Pt agreed as he understands now  that this is expected to be temporary    Exercises        Assessment/Plan    PT Assessment Patient needs continued PT services  PT Diagnosis Difficulty walking;Generalized weakness   PT Problem List Decreased strength;Decreased range of motion;Decreased activity tolerance;Decreased balance;Decreased mobility;Decreased coordination;Decreased knowledge of use of DME;Decreased safety awareness;Decreased knowledge of precautions;Cardiopulmonary status limiting activity;Obesity  PT Treatment Interventions DME  instruction;Functional mobility training;Stair training;Gait training;Therapeutic activities;Therapeutic exercise;Balance training;Neuromuscular re-education;Patient/family education   PT Goals (Current goals can be found in the Care Plan section) Acute Rehab PT Goals Patient Stated Goal: to go home with no extra equipment PT Goal Formulation: With patient/family Time For Goal Achievement: 08/10/15 Potential to Achieve Goals: Good    Frequency Min 3X/week   Barriers to discharge Inaccessible home environment stairs to enter house    Co-evaluation               End of Session Equipment Utilized During Treatment: Gait belt Activity Tolerance: Patient tolerated treatment well Patient left: in chair;with call bell/phone within reach;with family/visitor present Nurse Communication: Mobility status         Time: [id number] PT Time Calculation (min) (ACUTE ONLY): 34 min   Charges:   PT Evaluation $PT Eval Moderate Complexity: 1 Procedure PT Treatments $Gait Training: 8-22 mins   PT G Codes:        Ramond Dial 08/02/15, 5:20 PM    Mee Hives, PT MS Acute Rehab Dept. Number: [place] and [place]

## 2015-07-27 NOTE — Progress Notes (Signed)
07/27/2015 1:45 PM Hemodialysis Outpatient Note; Mr. Turin has been accepted at the [facility] on a Monday, Wednesday and Friday 1st shift schedule. The center can begin treatment on Friday July 21st at [facility]. Thank you. Gordy Savers

## 2015-07-27 NOTE — Progress Notes (Signed)
PROGRESS NOTE    Kevin James  [id number] DOB: Feb 25, 1955 DOA: 07/22/2015   PCP: Jerlyn Ly, MD   Brief Narrative:  60 year old male with a history of known history of chronic kidney disease. His AV fistula was placed by Dr. Kellie Simmering May 24, anemia of chronic disease, admitted with progressive weakness and dyspnea, found to have worsening AKI and Hb of 7.6  Renal consulted, HD cath placed, started HD and got 2 Unit PRBC this admission  Assessment & Plan: Progressive CKD5 - has AVF, s/p HD catheter, started HD 7/16 - reports feeling better this AM, still weak - nephrology team following  - xarelto stopped, not appropriate in ESRD - CLIP for HD, in process   Anemia of chronic disease - progressive, Hb was 13.6 in 7/16 and 7.6 on admission - no overt blood loss noted, likely due to CKD - Anemia panel c/w chronic disease, ferritin low too - scheduled for Colonoscopy in Aug/2017 with [facility]  Obstructive sleep apnea on CPAP  - CPAP ordered - oxygen sat' stable for now   Hypertension  - Stable, continue verapamil - IV lopressor PRN  Paroxysmal atrial fibrillation  - Stable, continue verapamil - Xarelto stopped, started Warfarin 7/16 since procedures completed - Dr. Broadus John d/w Dr.Ross pt's cardiologist , who was ok with changing to Warfarin  Gout  - Continue colchicine  Diabetes mellitus with complications of nephropathy  - hold Tradjenta, ADA diet, sliding-scale insulin  Dyslipidemia  - Stable, continue statin  Obesity  - Body mass index is 29.56 kg/(m^2).  DVT proph: swarfarin  Full Code Communication: wife at bedside updated, her questions answered  Dispo: home when Clipping for HD complete and uremic symptoms better  Consultants:   Renal   Subjective: Feels ok, no new complaints.  Objective: Filed Vitals:   07/26/15 1135 07/26/15 1651 07/26/15 2045 07/27/15 0455  BP: 112/65 127/64 129/79 131/81  Pulse: 66 95 77 48  Temp: 98.2 F (36.8 C)  99.9 F (37.7 C) 98.9 F (37.2 C) 98.5 F (36.9 C)  TempSrc: Oral Oral  Oral  Resp: 17 18 18 18   Height:      Weight:   101.606 kg (224 lb)   SpO2: 96% 95% 95% 98%    Intake/Output Summary (Last 24 hours) at 07/27/15 0658 Last data filed at 07/27/15 0456  Gross per 24 hour  Intake    950 ml  Output   2200 ml  Net  -1250 ml   Filed Weights   07/26/15 0650 07/26/15 1030 07/26/15 2045  Weight: 103.4 kg (227 lb 15.3 oz) 101.2 kg (223 lb 1.7 oz) 101.606 kg (224 lb)    Examination:  General exam: Appears calm and comfortable, AAOx3 Respiratory system: diminished BS at bases, normal respiratory effort  Cardiovascular system: S1 & S2 heard, RRR. No JVD, murmurs, rubs, gallops or clicks. No pedal edema. Gastrointestinal system: Abdomen is nondistended, soft and nontender. No organomegaly or masses felt. Normal bowel Central nervous system: Alert and oriented. No focal neurological deficits. Extremities: Symmetric 5 x 5 power. Skin: No rashes, lesions or ulcers Psychiatry: Judgement and insight appear normal. Mood & affect appropriate.     Data Reviewed: I have personally reviewed following labs and imaging studies  CBC:  Recent Labs Lab 07/22/15 1730 07/23/15 0512 07/24/15 0342 07/26/15 0704 07/27/15 0506  WBC 7.8 8.6 6.8 8.1 9.8  HGB 7.6* 7.7* 8.8* 10.0* 10.8*  HCT 24.0* 24.1* 27.5* 31.2* 33.0*  MCV 93.8 91.6 91.1 90.4 90.4  PLT 226 215 211 238 [id number]   Basic Metabolic Panel:  Recent Labs Lab 07/22/15 1730 07/23/15 0512 07/24/15 0342 07/26/15 0703 07/27/15 0506  NA 139 141 139 138 135  K 4.6 5.2* 4.7 4.0 3.9  CL 109 110 110 104 98*  CO2 16* 16* 17* 24 26  GLUCOSE 106* 86 74 86 105*  BUN 112* 105* 105* 45* 41*  CREATININE 8.52* 8.41* 7.83* 5.24* 5.48*  CALCIUM 9.0 9.2 8.6* 8.6* 9.0  PHOS  --   --  9.8* 6.3*  --    Liver Function Tests:  Recent Labs Lab 07/24/15 0342 07/26/15 0703  ALBUMIN 2.8* 2.9*   Coagulation Profile:  Recent Labs Lab  07/24/15 1415 07/25/15 0348 07/26/15 0537 07/27/15 0506  INR 1.23 1.22 1.54* 2.16*   Cardiac Enzymes:  Recent Labs Lab 07/22/15 2319 07/23/15 0512 07/23/15 1107  TROPONINI 0.03* 0.03* <0.03   CBG:  Recent Labs Lab 07/25/15 1653 07/25/15 2039 07/26/15 1134 07/26/15 1650 07/26/15 2044  GLUCAP 93 107* 158* 137* 140*   Anemia Panel:  Recent Labs  07/25/15 1400  FERRITIN 22*  TIBC 328  IRON 37*   Urine analysis:    Component Value Date/Time   COLORURINE YELLOW 07/23/2015 1522   APPEARANCEUR CLEAR 07/23/2015 1522   LABSPEC 1.013 07/23/2015 1522   PHURINE 6.0 07/23/2015 1522   GLUCOSEU 100* 07/23/2015 1522   HGBUR SMALL* 07/23/2015 1522   BILIRUBINUR NEGATIVE 07/23/2015 1522   KETONESUR NEGATIVE 07/23/2015 1522   PROTEINUR >300* 07/23/2015 1522   NITRITE NEGATIVE 07/23/2015 1522   LEUKOCYTESUR NEGATIVE 07/23/2015 1522   Recent Results (from the past 240 hour(s))  Surgical pcr screen     Status: None   Collection Time: 07/23/15  8:44 PM  Result Value Ref Range Status   MRSA, PCR NEGATIVE NEGATIVE Final   Staphylococcus aureus NEGATIVE NEGATIVE Final    Comment:        The Xpert SA Assay (FDA approved for NASAL specimens in patients over 75 years of age), is one component of a comprehensive surveillance program.  Test performance has been validated by [facility] for patients greater than or equal to 63 year old. It is not intended to diagnose infection nor to guide or monitor treatment.     Radiology Studies: No results found.  Scheduled Meds: . atorvastatin  80 mg Oral q1800  . calcium acetate  1,334 mg Oral TID WC  . [START ON 07/30/2015] darbepoetin (ARANESP) injection - DIALYSIS  100 mcg Intravenous Q Sat-HD  . docusate sodium  100 mg Oral BID  . feeding supplement (NEPRO CARB STEADY)  237 mL Oral BID BM  . ferric gluconate (FERRLECIT/NULECIT) IV  125 mg Intravenous Daily  . insulin aspart  0-15 Units Subcutaneous TID WC  . insulin aspart   0-5 Units Subcutaneous QHS  . sodium chloride flush  3 mL Intravenous Q12H  . verapamil  360 mg Oral Q lunch  . Warfarin - Pharmacist Dosing Inpatient   Does not apply q1800   Continuous Infusions:    LOS: 5 days    Time spent: 61min    Faye Ramsay, MD  [facility] Pager [phone]  If 7PM-7AM, please contact night-coverage [email or web address] Password [email or web address]  If 7PM-7AM, please contact night-coverage [email or web address] Password [facility] 07/27/2015, 6:58 AM

## 2015-07-27 NOTE — Discharge Summary (Signed)
Physician Discharge Summary  Kevin James [id number] DOB: 04-10-1955 DOA: 07/22/2015  PCP: Jerlyn Ly, MD  Admit date: 07/22/2015 Discharge date: 07/27/2015  Recommendations for Outpatient Follow-up:  1. Pt will need to follow up with PCP in 2 weeks post discharge 2. Pt advised he needs PT/INR check in 2-3 days to make sure coumadin dose is adequate  3. Please note pt started on coumadin and xarelto stopped in the setting on new ESRD 4. Please also noted lasix and spironolactone d/c per nephrology team recommendations    Discharge Diagnoses:  Active Problems:   Diabetes mellitus without complication (HCC)   Essential hypertension   Acute on chronic renal failure (HCC)   Gout   OSA on CPAP   Dyslipidemia   Symptomatic anemia    Discharge Condition: Stable  Diet recommendation: Renal diet    Brief Narrative:  60 year old male with a history of known history of chronic kidney disease. His AV fistula was placed by Dr. Kellie Simmering May 24, anemia of chronic disease, admitted with progressive weakness and dyspnea, found to have worsening AKI and Hb of 7.6  Renal consulted, HD cath placed, started HD and got 2 Unit PRBC this admission  Assessment & Plan: Progressive CKD5 - has AVF, s/p HD catheter, started HD 7/16 - reports feeling better this AM, still weak, PT eval pending  - nephrology team following  - xarelto stopped, not appropriate in ESRD - CLIP completed, pt set up with HD in [place] MWF  Anemia of chronic disease - progressive, Hb was 13.6 in 7/16 and 7.6 on admission - no overt blood loss noted, likely due to CKD - Anemia panel c/w chronic disease, ferritin low too - scheduled for Colonoscopy in Aug/2017 with [facility]  Obstructive sleep apnea on CPAP  - CPAP ordered - oxygen sat's stable for now   Hypertension  - Stable, continue verapamil  Paroxysmal atrial fibrillation  - Stable, continue verapamil - Xarelto stopped, started Warfarin 7/16 since  procedures completed - Dr. Broadus John d/w Dr.Ross pt's cardiologist , who was ok with changing to Warfarin  Gout  - Continue colchicine  Diabetes mellitus with complications of nephropathy  - hold Tradjenta, ADA diet  Dyslipidemia  - Stable, continue statin  Obesity  - Body mass index is 29.56 kg/(m^2).  DVT proph: warfarin  Full Code Communication: wife at bedside updated, her questions answered  Dispo: home   Consultants:   Renal  Discharge Exam: Filed Vitals:   07/27/15 0455 07/27/15 1000  BP: 131/81 135/74  Pulse: 48 50  Temp: 98.5 F (36.9 C) 98.6 F (37 C)  Resp: 18 18   Filed Vitals:   07/26/15 1651 07/26/15 2045 07/27/15 0455 07/27/15 1000  BP: 127/64 129/79 131/81 135/74  Pulse: 95 77 48 50  Temp: 99.9 F (37.7 C) 98.9 F (37.2 C) 98.5 F (36.9 C) 98.6 F (37 C)  TempSrc: Oral  Oral Oral  Resp: 18 18 18 18   Height:      Weight:  101.606 kg (224 lb)    SpO2: 95% 95% 98% 98%    General: Pt is alert, follows commands appropriately, not in acute distress Cardiovascular: S1/S2 +, no murmurs, no rubs, no gallops Respiratory: Clear to auscultation bilaterally, no wheezing, no crackles, no rhonchi Abdominal: Soft, non tender, non distended, bowel sounds +, no guarding   Discharge Instructions  Discharge Instructions    Diet - low sodium heart healthy    Complete by:  As directed  Increase activity slowly    Complete by:  As directed             Medication List    STOP taking these medications        furosemide 20 MG tablet  Commonly known as:  LASIX     oxyCODONE-acetaminophen 5-325 MG tablet  Commonly known as:  PERCOCET/ROXICET     Rivaroxaban 15 MG Tabs tablet  Commonly known as:  XARELTO     spironolactone 50 MG tablet  Commonly known as:  ALDACTONE      TAKE these medications        acetaminophen 650 MG CR tablet  Commonly known as:  TYLENOL  Take 1,300 mg by mouth every 8 (eight) hours as needed for pain. Reported on  07/22/2015     ARTHRITIS PAIN RELIEF PO  Take 1 tablet by mouth daily as needed.     atorvastatin 80 MG tablet  Commonly known as:  LIPITOR  Take 80 mg by mouth daily.     calcitRIOL 0.25 MCG capsule  Commonly known as:  ROCALTROL  Take 0.25 mcg by mouth daily.     colchicine 0.6 MG tablet  Take 0.5 tablets (0.3 mg total) by mouth daily.     febuxostat 40 MG tablet  Commonly known as:  ULORIC  Take 40 mg by mouth daily with lunch.     OMEGA-3 FATTY ACIDS PO  Take 2 capsules by mouth daily.     predniSONE 20 MG tablet  Commonly known as:  DELTASONE  Take 20 mg by mouth daily as needed (severe gout attack). Reported on 07/22/2015     PROCRIT IJ  Inject as directed. Patient is unsure of the dose he receives. Last dose was given last Thursday 07-14-15 per patient     sevelamer carbonate 800 MG tablet  Commonly known as:  RENVELA  Take 2 tablets (1,600 mg total) by mouth 3 (three) times daily with meals.     sitaGLIPtin 50 MG tablet  Commonly known as:  JANUVIA  Take 50 mg by mouth daily with lunch.     verapamil 360 MG 24 hr capsule  Commonly known as:  VERELAN PM  Take 360 mg by mouth daily with lunch.     warfarin 2.5 MG tablet  Commonly known as:  COUMADIN  Take 1 tablet (2.5 mg total) by mouth one time only at 6 PM.           Follow-up Information    Follow up with Jerlyn Ly, MD.   Specialty:  Internal Medicine   Contact information:   [address] [place] [address] [phone]       Call Faye Ramsay, MD.   Specialty:  Internal Medicine   Why:  As needed   Contact information:   [address] [place] [address] [phone]        The results of significant diagnostics from this hospitalization (including imaging, microbiology, ancillary and laboratory) are listed below for reference.     Microbiology: Recent Results (from the past 240 hour(s))  Surgical pcr screen     Status: None   Collection Time:  07/23/15  8:44 PM  Result Value Ref Range Status   MRSA, PCR NEGATIVE NEGATIVE Final   Staphylococcus aureus NEGATIVE NEGATIVE Final    Comment:        The Xpert SA Assay (FDA approved for NASAL specimens in patients over 56 years of age), is one component  of a comprehensive surveillance program.  Test performance has been validated by [facility] for patients greater than or equal to 38 year old. It is not intended to diagnose infection nor to guide or monitor treatment.      Labs: Basic Metabolic Panel:  Recent Labs Lab 07/22/15 1730 07/23/15 0512 07/24/15 0342 07/26/15 0703 07/27/15 0506  NA 139 141 139 138 135  K 4.6 5.2* 4.7 4.0 3.9  CL 109 110 110 104 98*  CO2 16* 16* 17* 24 26  GLUCOSE 106* 86 74 86 105*  BUN 112* 105* 105* 45* 41*  CREATININE 8.52* 8.41* 7.83* 5.24* 5.48*  CALCIUM 9.0 9.2 8.6* 8.6* 9.0  PHOS  --   --  9.8* 6.3*  --    Liver Function Tests:  Recent Labs Lab 07/24/15 0342 07/26/15 0703  ALBUMIN 2.8* 2.9*   No results for input(s): LIPASE, AMYLASE in the last 168 hours. No results for input(s): AMMONIA in the last 168 hours. CBC:  Recent Labs Lab 07/22/15 1730 07/23/15 0512 07/24/15 0342 07/26/15 0704 07/27/15 0506  WBC 7.8 8.6 6.8 8.1 9.8  HGB 7.6* 7.7* 8.8* 10.0* 10.8*  HCT 24.0* 24.1* 27.5* 31.2* 33.0*  MCV 93.8 91.6 91.1 90.4 90.4  PLT 226 215 211 238 244   Cardiac Enzymes:  Recent Labs Lab 07/22/15 2319 07/23/15 0512 07/23/15 1107  TROPONINI 0.03* 0.03* <0.03   BNP: BNP (last 3 results) No results for input(s): BNP in the last 8760 hours.  ProBNP (last 3 results) No results for input(s): PROBNP in the last 8760 hours.  CBG:  Recent Labs Lab 07/26/15 1134 07/26/15 1650 07/26/15 2044 07/27/15 0759 07/27/15 1144  GLUCAP 158* 137* 140* 93 115*   SIGNED: Time coordinating discharge: 30 minutes  MAGICK-Orli Degrave, MD  [facility] 07/27/2015, 1:32 PM Pager [phone]  If 7PM-7AM, please  contact night-coverage [email or web address] Password [email or web address]

## 2015-07-27 NOTE — Progress Notes (Signed)
Patient discharged to home with spouse. Outpatient dialysis arranged by HD secretary. PIV removed per protocol. Telemetry box #7 removed and returned to nurse's station. Discharge instructions reviewed with patient and patient's spouse. Multiple education handouts given to patient.   Joellen Jersey, RN.

## 2015-07-27 NOTE — Progress Notes (Signed)
Found out that patient has been accepted to [facility] HD unit on MWF and can start there on Friday.  It is OK for him to go til Friday for further HD- family is OK to take him to HD on Friday until we can get transportation worked out, I have talked with SW at the kidney center   [place]

## 2015-07-27 NOTE — Progress Notes (Signed)
Patient ID: Kevin James, male   DOB: 03/11/1955, 60 y.o.   MRN: [id number]  S: Patient reports no complaints this morning. Wife noted that he had bilateral hand cramping after his dialysis session yesterday.    O:BP 135/74 mmHg  Pulse 50  Temp(Src) 98.6 F (37 C) (Oral)  Resp 18  Ht 6\' 1"  (1.854 m)  Wt 101.606 kg (224 lb)  BMI 29.56 kg/m2  SpO2 98%  Intake/Output Summary (Last 24 hours) at 07/27/15 1122 Last data filed at 07/27/15 0900  Gross per 24 hour  Intake   1190 ml  Output    200 ml  Net    990 ml   Intake/Output: I/O last 3 completed shifts: In: 13 [P.O.:840; IV Piggyback:110] Out: 2950 [Urine:950; Other:2000]  Intake/Output this shift:  Total I/O In: 240 [P.O.:240] Out: -  Weight change: -3.3 kg (-7 lb 4.4 oz) Gen:WD WN WM  CVS:RRR, no rub Resp:CTAB [id number] Ext:no edema, L AVF +T/B, tunneled HD cath right subclavian    Recent Labs Lab 07/22/15 1730 07/23/15 0512 07/24/15 0342 07/26/15 0703 07/27/15 0506  NA 139 141 139 138 135  K 4.6 5.2* 4.7 4.0 3.9  CL 109 110 110 104 98*  CO2 16* 16* 17* 24 26  GLUCOSE 106* 86 74 86 105*  BUN 112* 105* 105* 45* 41*  CREATININE 8.52* 8.41* 7.83* 5.24* 5.48*  ALBUMIN  --   --  2.8* 2.9*  --   CALCIUM 9.0 9.2 8.6* 8.6* 9.0  PHOS  --   --  9.8* 6.3*  --    Liver Function Tests:  Recent Labs Lab 07/24/15 0342 07/26/15 0703  ALBUMIN 2.8* 2.9*   CBC:  Recent Labs Lab 07/22/15 1730 07/23/15 0512 07/24/15 0342 07/26/15 0704 07/27/15 0506  WBC 7.8 8.6 6.8 8.1 9.8  HGB 7.6* 7.7* 8.8* 10.0* 10.8*  HCT 24.0* 24.1* 27.5* 31.2* 33.0*  MCV 93.8 91.6 91.1 90.4 90.4  PLT 226 215 211 238 244   Cardiac Enzymes:  Recent Labs Lab 07/22/15 2319 07/23/15 0512 07/23/15 1107  TROPONINI 0.03* 0.03* <0.03   CBG:  Recent Labs Lab 07/25/15 2039 07/26/15 1134 07/26/15 1650 07/26/15 2044 07/27/15 0759  GLUCAP 107* 158* 137* 140* 93    Iron Studies:   Recent Labs  07/25/15 1400  IRON 37*   TIBC 328  FERRITIN 22*   Studies/Results: No results found. Marland Kitchen atorvastatin  80 mg Oral q1800  . [START ON 07/30/2015] darbepoetin (ARANESP) injection - DIALYSIS  100 mcg Intravenous Q Sat-HD  . docusate sodium  100 mg Oral BID  . feeding supplement (NEPRO CARB STEADY)  237 mL Oral BID BM  . ferric gluconate (FERRLECIT/NULECIT) IV  125 mg Intravenous Daily  . insulin aspart  0-15 Units Subcutaneous TID WC  . insulin aspart  0-5 Units Subcutaneous QHS  . sevelamer carbonate  1,600 mg Oral TID WC  . sodium chloride flush  3 mL Intravenous Q12H  . verapamil  360 mg Oral Q lunch  . warfarin  2.5 mg Oral ONCE-1800  . Warfarin - Pharmacist Dosing Inpatient   Does not apply q1800    BMET    Component Value Date/Time   NA 135 07/27/2015 0506   K 3.9 07/27/2015 0506   CL 98* 07/27/2015 0506   CO2 26 07/27/2015 0506   GLUCOSE 105* 07/27/2015 0506   BUN 41* 07/27/2015 0506   CREATININE 5.48* 07/27/2015 0506   CALCIUM 9.0 07/27/2015 0506   GFRNONAA 10* 07/27/2015 [id number]  GFRAA 12* 07/27/2015 0506   CBC    Component Value Date/Time   WBC 9.8 07/27/2015 0506   RBC 3.65* 07/27/2015 0506   RBC 2.68* 07/23/2015 1107   HGB 10.8* 07/27/2015 0506   HCT 33.0* 07/27/2015 0506   PLT 244 07/27/2015 0506   MCV 90.4 07/27/2015 0506   MCH 29.6 07/27/2015 0506   MCHC 32.7 07/27/2015 0506   RDW 14.1 07/27/2015 0506   LYMPHSABS 2.3 07/28/2014 0937   MONOABS 0.7 07/28/2014 0937   EOSABS 0.3 07/28/2014 0937   BASOSABS 0.1 07/28/2014 0937    Assessment/Plan: 1. Progressive CKD now at stage 5. s/p placement of HD catheter. Underwent HD 7/16, 7/17, 7/18. CLIP process started, although difficult since he lives in [place], [place]. He would like to pursue home therapies once stabilized. Says can do [place] because wants to stay with [facility]- transportation may be difficult.  I think he will eventually be able to drive but not right off and I also think could do [place] stage but will need to have AVF working  ideally before trained. Has no edema present today. BP stable today. Did experience hand cramping after dialysis yesterday, may have taken too much fluid off. Will need to adjust. Has just heard he's been accepted at the [facility] Monday Wednesday Friday- I think he could wait until Friday for his next treatment  2. Anemia of chronic disease 1. S/p one unit of PRBC at admission with appropriate bump in hgb Started Aranesp. Hgb stable at 10.8 today. Ferric gluconate x3 doses.                                                                                                    3. CKD-BMD- Ca 9.0 (9.9 corrected). Phos improving 9.8 > 6.3. iPTH 185. Will follow calcium, phos, iPTH.  Will change phoslo to renvela given elevate Ca. 4.  5. Vascular access- LAVF placed 06/01/15 and s/p tunneled HD catheter by Dr. Bridgett Larsson on 07/24/15 (appreciate his assistance) 6.  7. N/V/anorexia- resolved. Likely 2/2 to uremia on presentation. Improved 8. SOB- Resolved. presumably due to anemia 9. Hyperkalemia- Resolved. K 4.0 today.  10. Deconditioning- will benefit from PT/OT  Maryellen Pile  [facility] PGY-2 [phone]  Patient seen and examined, agree with above note with above modifications. Patient is clinically improved since initiating dialysis. Have just heard he's been accepted at the [facility] kidney Center on Monday Wednesday Friday- I believe he can make it Friday for his next treatment so could possibly be discharged today? He is at his dry weight we will continue to treat anemia and bone disease at the kidney center Corliss Parish, MD 07/27/2015

## 2015-08-10 ENCOUNTER — Encounter: Payer: Self-pay | Admitting: Gastroenterology

## 2015-08-19 ENCOUNTER — Ambulatory Visit: Payer: BLUE CROSS/BLUE SHIELD | Admitting: Gastroenterology

## 2015-08-24 ENCOUNTER — Other Ambulatory Visit: Payer: Self-pay | Admitting: Vascular Surgery

## 2015-08-24 DIAGNOSIS — T82510D Breakdown (mechanical) of surgically created arteriovenous fistula, subsequent encounter: Secondary | ICD-10-CM

## 2015-09-06 ENCOUNTER — Encounter: Payer: Self-pay | Admitting: Vascular Surgery

## 2015-09-08 ENCOUNTER — Encounter: Payer: Self-pay | Admitting: Vascular Surgery

## 2015-09-08 ENCOUNTER — Ambulatory Visit (HOSPITAL_COMMUNITY)
Admission: RE | Admit: 2015-09-08 | Discharge: 2015-09-08 | Disposition: A | Discharge status: Home / Self Care | Payer: BLUE CROSS/BLUE SHIELD | Source: Ambulatory Visit | Attending: Vascular Surgery | Admitting: Vascular Surgery

## 2015-09-08 ENCOUNTER — Ambulatory Visit (INDEPENDENT_AMBULATORY_CARE_PROVIDER_SITE_OTHER): Payer: BLUE CROSS/BLUE SHIELD | Admitting: Neurology

## 2015-09-08 ENCOUNTER — Encounter: Payer: Self-pay | Admitting: Neurology

## 2015-09-08 ENCOUNTER — Ambulatory Visit (INDEPENDENT_AMBULATORY_CARE_PROVIDER_SITE_OTHER): Payer: BLUE CROSS/BLUE SHIELD | Admitting: Vascular Surgery

## 2015-09-08 VITALS — BP 128/70 | HR 76 | Resp 20 | Ht 74.0 in | Wt 238.0 lb

## 2015-09-08 VITALS — BP 137/83 | HR 67 | Ht 73.0 in | Wt 239.4 lb

## 2015-09-08 DIAGNOSIS — N186 End stage renal disease: Secondary | ICD-10-CM | POA: Diagnosis not present

## 2015-09-08 DIAGNOSIS — G4733 Obstructive sleep apnea (adult) (pediatric): Secondary | ICD-10-CM

## 2015-09-08 DIAGNOSIS — Z992 Dependence on renal dialysis: Secondary | ICD-10-CM | POA: Diagnosis not present

## 2015-09-08 DIAGNOSIS — Y848 Other medical procedures as the cause of abnormal reaction of the patient, or of later complication, without mention of misadventure at the time of the procedure: Secondary | ICD-10-CM | POA: Insufficient documentation

## 2015-09-08 DIAGNOSIS — Z9989 Dependence on other enabling machines and devices: Principal | ICD-10-CM

## 2015-09-08 DIAGNOSIS — T82510D Breakdown (mechanical) of surgically created arteriovenous fistula, subsequent encounter: Secondary | ICD-10-CM | POA: Insufficient documentation

## 2015-09-08 NOTE — Patient Instructions (Signed)
I am glad you are doing better since your July hospitalization.  We will reduce your CPAP pressure back to 8 cm.  We will see how it goes. Good job with your CPAP compliance.
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
## 2015-09-09 ENCOUNTER — Other Ambulatory Visit: Payer: Self-pay

## 2015-09-09 ENCOUNTER — Telehealth: Payer: Self-pay
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
## 2015-09-09 NOTE — Telephone Encounter (Signed)
rec'd call from [facility] @ Dr. Silvestre Mesi office.  Stated that Dr. Joylene Draft advised that pt. Does not need a Lovenox bridge, and has approved for pt. to hold Coumadin four  days prior to surgery, and to resume as soon as possible after surgery.  Notified pt's wife of recommendation per Dr. Joylene Draft.  Verb. Understanding.

## 2015-09-15 ENCOUNTER — Ambulatory Visit (HOSPITAL_COMMUNITY)
Admission: RE | Admit: 2015-09-15 | Discharge: 2015-09-15 | Disposition: A | Discharge status: Home / Self Care | Payer: BLUE CROSS/BLUE SHIELD | Source: Ambulatory Visit | Attending: Vascular Surgery | Admitting: Vascular Surgery

## 2015-09-15 ENCOUNTER — Encounter (HOSPITAL_COMMUNITY)
Admission: RE | Disposition: A | Discharge status: Home / Self Care | Payer: Self-pay | Source: Ambulatory Visit | Attending: Vascular Surgery

## 2015-09-15 ENCOUNTER — Other Ambulatory Visit: Payer: Self-pay | Admitting: *Deleted

## 2015-09-15 DIAGNOSIS — T82858A Stenosis of vascular prosthetic devices, implants and grafts, initial encounter: Secondary | ICD-10-CM | POA: Insufficient documentation

## 2015-09-15 DIAGNOSIS — M1990 Unspecified osteoarthritis, unspecified site: Secondary | ICD-10-CM | POA: Diagnosis not present

## 2015-09-15 DIAGNOSIS — I480 Paroxysmal atrial fibrillation: Secondary | ICD-10-CM | POA: Diagnosis not present

## 2015-09-15 DIAGNOSIS — E1122 Type 2 diabetes mellitus with diabetic chronic kidney disease: Secondary | ICD-10-CM | POA: Diagnosis not present

## 2015-09-15 DIAGNOSIS — E669 Obesity, unspecified: Secondary | ICD-10-CM | POA: Insufficient documentation

## 2015-09-15 DIAGNOSIS — M109 Gout, unspecified: Secondary | ICD-10-CM | POA: Insufficient documentation

## 2015-09-15 DIAGNOSIS — Z6831 Body mass index (BMI) 31.0-31.9, adult: Secondary | ICD-10-CM | POA: Insufficient documentation

## 2015-09-15 DIAGNOSIS — G4733 Obstructive sleep apnea (adult) (pediatric): Secondary | ICD-10-CM | POA: Diagnosis not present

## 2015-09-15 DIAGNOSIS — N179 Acute kidney failure, unspecified: Secondary | ICD-10-CM | POA: Diagnosis present

## 2015-09-15 DIAGNOSIS — Y832 Surgical operation with anastomosis, bypass or graft as the cause of abnormal reaction of the patient, or of later complication, without mention of misadventure at the time of the procedure: Secondary | ICD-10-CM | POA: Diagnosis not present

## 2015-09-15 DIAGNOSIS — Z7901 Long term (current) use of anticoagulants: Secondary | ICD-10-CM | POA: Insufficient documentation

## 2015-09-15 DIAGNOSIS — N186 End stage renal disease: Secondary | ICD-10-CM | POA: Insufficient documentation

## 2015-09-15 DIAGNOSIS — Z992 Dependence on renal dialysis: Secondary | ICD-10-CM | POA: Diagnosis not present

## 2015-09-15 DIAGNOSIS — Z4931 Encounter for adequacy testing for hemodialysis: Secondary | ICD-10-CM

## 2015-09-15 DIAGNOSIS — N189 Chronic kidney disease, unspecified: Secondary | ICD-10-CM

## 2015-09-15 DIAGNOSIS — I120 Hypertensive chronic kidney disease with stage 5 chronic kidney disease or end stage renal disease: Secondary | ICD-10-CM | POA: Insufficient documentation

## 2015-09-15 DIAGNOSIS — M545 Low back pain: Secondary | ICD-10-CM | POA: Diagnosis not present

## 2015-09-15 DIAGNOSIS — T82898A Other specified complication of vascular prosthetic devices, implants and grafts, initial encounter: Secondary | ICD-10-CM

## 2015-09-15 HISTORY — PX: PERIPHERAL VASCULAR CATHETERIZATION: SHX172C

## 2015-09-15 LAB — POCT I-STAT, CHEM 8
BUN: 36 mg/dL — ABNORMAL HIGH (ref 6–20)
CREATININE: 4.4 mg/dL — AB (ref 0.61–1.24)
Calcium, Ion: 1.07 mmol/L — ABNORMAL LOW (ref 1.15–1.40)
Chloride: 96 mmol/L — ABNORMAL LOW (ref 101–111)
GLUCOSE: 88 mg/dL (ref 65–99)
HCT: 31 % — ABNORMAL LOW (ref 39.0–52.0)
HEMOGLOBIN: 10.5 g/dL — AB (ref 13.0–17.0)
Potassium: 3.7 mmol/L (ref 3.5–5.1)
Sodium: 138 mmol/L (ref 135–145)
TCO2: 31 mmol/L (ref 0–100)

## 2015-09-15 LAB — PROTIME-INR
INR: 1.17
PROTHROMBIN TIME: 15 s (ref 11.4–15.2)

## 2015-09-15 LAB — GLUCOSE, CAPILLARY: GLUCOSE-CAPILLARY: 89 mg/dL (ref 65–99)

## 2015-09-15 SURGERY — A/V SHUNTOGRAM/FISTULAGRAM
Anesthesia: LOCAL | Laterality: Left

## 2015-09-15 MED ORDER — FENTANYL CITRATE (PF) 100 MCG/2ML IJ SOLN
INTRAMUSCULAR | Status: DC | PRN
  Administered 2015-09-15: 50 ug via INTRAVENOUS

## 2015-09-15 MED ORDER — HEPARIN (PORCINE) IN NACL 2-0.9 UNIT/ML-% IJ SOLN
INTRAMUSCULAR | Status: DC | PRN
  Administered 2015-09-15: 500 mL

## 2015-09-15 MED ORDER — LIDOCAINE HCL (PF) 1 % IJ SOLN
INTRAMUSCULAR | Status: DC | PRN
  Administered 2015-09-15: 2 mL

## 2015-09-15 MED ORDER — SODIUM CHLORIDE 0.9% FLUSH: 3.0000 mL | INTRAVENOUS | Status: DC | PRN

## 2015-09-15 MED ORDER — OXYCODONE-ACETAMINOPHEN 5-325 MG PO TABS
1.0000 | ORAL_TABLET | ORAL | Status: DC | PRN
Start: 2015-09-15 — End: 2015-09-15

## 2015-09-15 MED ORDER — LIDOCAINE HCL (PF) 1 % IJ SOLN
INTRAMUSCULAR | Status: AC
  Filled 2015-09-15: qty 30

## 2015-09-15 MED ORDER — SODIUM CHLORIDE 0.9% FLUSH: 3.0000 mL | Freq: Two times a day (BID) | INTRAVENOUS | Status: DC

## 2015-09-15 MED ORDER — MORPHINE SULFATE (PF) 2 MG/ML IV SOLN: 1.0000 mg | INTRAVENOUS | Status: DC | PRN

## 2015-09-15 MED ORDER — HEPARIN SODIUM (PORCINE) 1000 UNIT/ML IJ SOLN
INTRAMUSCULAR | Status: DC | PRN
  Administered 2015-09-15: 3000 [IU] via INTRAVENOUS

## 2015-09-15 MED ORDER — HEPARIN (PORCINE) IN NACL 2-0.9 UNIT/ML-% IJ SOLN
INTRAMUSCULAR | Status: AC
  Filled 2015-09-15: qty 500

## 2015-09-15 MED ORDER — FENTANYL CITRATE (PF) 100 MCG/2ML IJ SOLN
INTRAMUSCULAR | Status: AC
  Filled 2015-09-15: qty 2

## 2015-09-15 MED ORDER — HEPARIN SODIUM (PORCINE) 1000 UNIT/ML IJ SOLN
INTRAMUSCULAR | Status: AC
  Filled 2015-09-15: qty 1

## 2015-09-15 MED ORDER — SODIUM CHLORIDE 0.9 % IV SOLN: 250.0000 mL | INTRAVENOUS | Status: DC | PRN

## 2015-09-15 MED ORDER — ACETAMINOPHEN 325 MG PO TABS: 650.0000 mg | ORAL_TABLET | ORAL | Status: DC | PRN

## 2015-09-15 MED ORDER — SODIUM CHLORIDE 0.9% FLUSH
3.0000 mL | INTRAVENOUS | Status: DC | PRN
Start: 2015-09-15 — End: 2015-09-15

## 2015-09-15 SURGICAL SUPPLY — 14 items
BAG SNAP BAND KOVER 36X36 (MISCELLANEOUS) ×2 IMPLANT
BALLN ARMADA 6X100X80 (BALLOONS) ×1 IMPLANT
COVER DOME SNAP 22 D (MISCELLANEOUS) ×2 IMPLANT
COVER PRB 48X5XTLSCP FOLD TPE (BAG) ×1 IMPLANT
COVER PROBE 5X48 (BAG) ×2
GUIDEWIRE ANGLED .035X150CM (WIRE) ×1 IMPLANT
KIT ENCORE 26 ADVANTAGE (KITS) ×1 IMPLANT
KIT MICROINTRODUCER STIFF 5F (SHEATH) ×1 IMPLANT
PROTECTION STATION PRESSURIZED (MISCELLANEOUS) ×2
SHEATH PINNACLE R/O II 6F 4CM (SHEATH) ×1 IMPLANT
STATION PROTECTION PRESSURIZED (MISCELLANEOUS) ×1 IMPLANT
STOPCOCK MORSE 400PSI 3WAY (MISCELLANEOUS) ×2 IMPLANT
TRAY PV CATH (CUSTOM PROCEDURE TRAY) ×2 IMPLANT
TUBING CIL FLEX 10 FLL-RA (TUBING) ×2 IMPLANT

## 2015-09-15 NOTE — Op Note (Addendum)
     OPERATIVE NOTE   PROCEDURE: 1.  left radiocephalic arteriovenous fistula cannulation under ultrasound guidance 2.  left arm shuntogram 3.  Venoplasty of cephalic vein (6 mm x 098 mm)  PRE-OPERATIVE DIAGNOSIS: Non-maturation of left radiocephalic arteriovenous fistula  POST-OPERATIVE DIAGNOSIS: same as above   SURGEON: Adele Barthel, MD  ANESTHESIA: local  ESTIMATED BLOOD LOSS: 5 cc  FINDING(S): 1. Patent central venous structions 2. Right internal jugular vein tunneled dialysis catheter in right atrium 3. Patent subclavian vein, axillary, and brachial veins 4. Upper arm cephalic vein is occluded with only collateral veins evident 5. Patent left radiocephalic arteriovenous fistula: maximal diameter 4.8 mm, distal segment stenoses ? obvious waists resolved with venoplasty, improved diameters post-venoplasty > 5.5-6.0 mm    SPECIMEN(S):  None  CONTRAST: 55 cc  INDICATIONS: Kevin James is a 60 y.o. male who presents with poor maturation of radiocephalic arteriovenous fistula.  The patient is scheduled for left arm fistulogram, possible balloon assisted maturation.  I discussed with the patient the nature of angiographic procedures, especially the limited patencies of any endovascular intervention.  The patient is aware of that the risks of an angiographic procedure include but are not limited to: bleeding, infection, access site complications, thrombosis of access, renal failure, embolization, rupture of vessel, dissection, arteriovenous fistula, possible need for emergent surgical intervention, possible need for surgical procedures to treat the patient's pathology, anaphylactic reaction to contrast, and stroke and death.  The patient is aware of the risks of the procedure and elects to proceed forward.   DESCRIPTION: After full informed written consent was obtained, the patient was brought back to the angiography suite and placed supine upon the angiography table.  The  patient was connected to monitoring equipment.  The left forearm was prepped and draped in the standard fashion for a percutaneous access intervention.  Under ultrasound guidance, the left radiocephalic arteriovenous fistula was cannulated with a micropuncture needle.  The microwire was advanced into the fistula and the needle was exchanged for the a microsheath, which was lodged 2 cm into the access.  The wire was removed and the sheath was connected to the IV extension tubing.  Hand injections were completed to image the access from the wrist up to the level of superior vena cava.  Based on the images, this patient will need: balloon assisted maturation procedure.  A Glidewire wire was advanced into the axillary vein and the sheath was exchanged for a short 6-Fr sheath.  The patient was given 3000 units of Heparin intravenously.  Based on the imaging, a 6 mm x 100 mm angioplasty balloon was selected.  The balloon was inflated to 6 ATM for 3 minutes and then deflated to 2 ATM for 2 minutes along the entirely of this cephalic vein.  In this process, waists in the distal fistula and proximal fistula were visualized.  The visualized waists resolved spontaneously.  On completion imaging, near total resolution of stenoses were visualized.  The fistula appeared to be > 5.5-6.0 mm.  Based on the completion imaging, no further intervention is necessary.  The wire and balloon were removed from the sheath.  A 4-0 Monocryl purse-string suture was sewn around the sheath.  The sheath was removed while tying down the suture.  A sterile bandage was applied to the puncture site.   COMPLICATIONS: none   CONDITION: stable   Adele Barthel, MD [facility] [place] Office: [phone] Pager: [phone]  09/15/2015 11:10 AM

## 2015-09-15 NOTE — Interval H&P Note (Signed)
[facility] [place]  History and Physical Update  The patient was interviewed and re-examined.  The patient's previous History and Physical has been reviewed and is unchanged from Dr. Nona Dell consult.  There is no change in the plan of care: L arm fistulogram, possible balloon assisted maturation.   I discussed with the patient the nature of angiographic procedures, especially the limited patencies of any endovascular intervention.    The patient is aware of that the risks of an angiographic procedure include but are not limited to: bleeding, infection, access site complications, renal failure, embolization, rupture of vessel, dissection, arteriovenous fistula, possible need for emergent surgical intervention, possible need for surgical procedures to treat the patient's pathology, anaphylactic reaction to contrast, and stroke and death.    The patient is aware of the risks and agrees to proceed.   Adele Barthel, MD [facility] [place] Office: [phone] Pager: [phone]  09/15/2015, 7:28 AM
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
## 2015-09-15 NOTE — Discharge Instructions (Signed)
Fistulogram, Care After °Refer to this sheet in the next few weeks. These instructions provide you with information on caring for yourself after your procedure. Your health care provider may also give you more specific instructions. Your treatment has been planned according to current medical practices, but problems sometimes occur. Call your health care provider if you have any problems or questions after your procedure. °WHAT TO EXPECT AFTER THE PROCEDURE °After your procedure, it is typical to have the following: °· A small amount of discomfort in the area where the catheters were placed. °· A small amount of bruising around the fistula. °· Sleepiness and fatigue. °HOME CARE INSTRUCTIONS °· Rest at home for the day following your procedure. °· Do not drive or operate heavy machinery while taking pain medicine. °· Take medicines only as directed by your health care provider. °· Do not take baths, swim, or use a hot tub until your health care provider approves. You may shower 24 hours after the procedure or as directed by your health care provider. °· There are many different ways to close and cover an incision, including stitches, skin glue, and adhesive strips. Follow your health care provider's instructions on: °¨ Incision care. °¨ Bandage (dressing) changes and removal. °¨ Incision closure removal. °· Monitor your dialysis fistula carefully. °SEEK MEDICAL CARE IF: °· You have drainage, redness, swelling, or pain at your catheter site. °· You have a fever. °· You have chills. °SEEK IMMEDIATE MEDICAL CARE IF: °· You feel weak. °· You have trouble balancing. °· You have trouble moving your arms or legs. °· You have problems with your speech or vision. °· You can no longer feel a vibration or buzz when you put your fingers over your dialysis fistula. °· The limb that was used for the procedure: °¨ Swells. °¨ Is painful. °¨ Is cold. °¨ Is discolored, such as blue or pale white. °  °This information is not intended  to replace advice given to you by your health care provider. Make sure you discuss any questions you have with your health care provider. °  °Document Released: 05/11/2013 Document Reviewed: 05/11/2013 °Elsevier Interactive Patient Education ©2016 [employer] Inc[employer] ° °

## 2015-09-16 ENCOUNTER — Encounter (HOSPITAL_COMMUNITY): Payer: Self-pay | Admitting: Vascular Surgery

## 2015-09-16 ENCOUNTER — Telehealth: Payer: Self-pay | Admitting: Vascular Surgery

## 2015-09-16 NOTE — Telephone Encounter (Signed)
LM on VM about appts 9/29 & 10/6 with Dr Bridgett Larsson & [place], 09/16/15 beg

## 2015-09-16 NOTE — Telephone Encounter (Signed)
-----   Message from Mena Goes, RN sent at 09/15/2015 11:24 AM EDT ----- Regarding: 2-4 weeks with duplex   ----- Message ----- From: Conrad Corunna, MD Sent: 09/15/2015  11:18 AM To: [facility] [address]  Kevin James [id number] 01/10/1955   PROCEDURE: 1.  left radiocephalic arteriovenous fistula cannulation under ultrasound guidance 2.  left arm shuntogram 3.  Venoplasty of cephalic vein (6 mm x 173 mm)  Follow-up: 2-4 weeks  Orders(s) for follow-up: L arm access duplex

## 2015-10-07 ENCOUNTER — Encounter (HOSPITAL_COMMUNITY): Payer: BLUE CROSS/BLUE SHIELD

## 2015-10-11 ENCOUNTER — Encounter: Payer: Self-pay | Admitting: Family

## 2015-10-11 ENCOUNTER — Ambulatory Visit (HOSPITAL_COMMUNITY)
Admission: RE | Admit: 2015-10-11 | Discharge: 2015-10-11 | Disposition: A | Discharge status: Home / Self Care | Payer: BLUE CROSS/BLUE SHIELD | Source: Ambulatory Visit | Attending: Vascular Surgery | Admitting: Vascular Surgery

## 2015-10-11 DIAGNOSIS — Z4931 Encounter for adequacy testing for hemodialysis: Secondary | ICD-10-CM | POA: Insufficient documentation

## 2015-10-11 DIAGNOSIS — N186 End stage renal disease: Secondary | ICD-10-CM | POA: Insufficient documentation

## 2015-10-13 ENCOUNTER — Other Ambulatory Visit: Payer: Self-pay

## 2015-10-13 ENCOUNTER — Ambulatory Visit (INDEPENDENT_AMBULATORY_CARE_PROVIDER_SITE_OTHER): Payer: Self-pay | Admitting: Family

## 2015-10-13 ENCOUNTER — Encounter: Payer: Self-pay | Admitting: Family

## 2015-10-13 VITALS — BP 109/69 | HR 102 | Temp 98.1°F | Resp 20 | Ht 73.0 in | Wt 245.7 lb

## 2015-10-13 DIAGNOSIS — Z992 Dependence on renal dialysis: Secondary | ICD-10-CM

## 2015-10-13 DIAGNOSIS — N186 End stage renal disease: Secondary | ICD-10-CM

## 2015-10-13 DIAGNOSIS — T82898D Other specified complication of vascular prosthetic devices, implants and grafts, subsequent encounter: Secondary | ICD-10-CM

## 2015-10-13 NOTE — Progress Notes (Addendum)
    Postoperative Access Visit   History of Present Illness  Kevin James is a 60 y.o. year old malepatient that is s/p left radiocephalic arteriovenous fistula cannulation under ultrasound guidance, left arm shuntogram, and Venoplasty of cephalic vein (6 mm x 294 mm) on 09/15/15 by Dr. Bridgett Larsson for non-maturation of left radiocephalic arteriovenous fistula. FINDING(S): 1. Patent central venous structions 2. Right internal jugular vein tunneled dialysis catheter in right atrium 3. Patent subclavian vein, axillary, and brachial veins 4. Upper arm cephalic vein is occluded with only collateral veins evident Patent left radiocephalic arteriovenous fistula: maximal diameter 4.8 mm, distal segment stenoses ? obvious waists resolved with venoplasty, improved diameters post-venoplasty > 5.5-6.0 mm.  He had a dialysis access duplex on 10/11/15 and returns today for discussion of this with Dr. Bridgett Larsson, but Dr. Bridgett Larsson is not in the office today, and pt cannot come to our office on a Monday, Wednesday, or Friday since he has HD all day on those days, but speaking with pt and wife today, he finishes HD at about 11:30 in the morning, right side chest tunneled dialysis catheter is used.   The patient's right forearm incision is healed.  The patient notes no steal symptoms.  The patient is able to complete their activities of daily living.   For VQI Use Only  PRE-ADM LIVING: Home  AMB STATUS: Ambulatory  Physical Examination Vitals:   10/13/15 0910  BP: 109/69  Pulse: (!) 102  Resp: 20  Temp: 98.1 F (36.7 C)  TempSrc: Oral  SpO2: 98%  Weight: 245 lb 11.2 oz (111.4 kg)  Height: 6\' 1"  (1.854 m)   Body mass index is 32.42 kg/m.  Left UE: Incision is healed, skin feels normal and warm, hand grip is 5/5, sensation in digits is  intact, palpable thrill over AV fistula. Left radial pulse is 1-2+ palpable.  Medical Decision Making  Kevin James is a 60 y.o. year old male who presents s/p left  radiocephalic arteriovenous fistula cannulation under ultrasound guidance, left arm shuntogram, and Venoplasty of cephalic vein (6 mm x 765 mm) on 09/15/15 by Dr. Bridgett Larsson for non-maturation of left radiocephalic arteriovenous fistula.  He takes coumadin for paroxysmal atrial fib.   Dr. Oneida Alar spoke with pt and wife and examined pt after he viewed the results of 10/11/15 AVF duplex.  Left radio cephalic AVF is too small for HD; will schedule creation of left basilic vein transposition fistula with general anesthesia on 11/01/15 by Dr. Oneida Alar  Thank you for allowing [place] to participate in this patient's care.  NICKEL, Sharmon Leyden, RN, MSN, FNP-C [facility] Office: [phone]  10/13/2015, 9:51 AM  Clinic MD: Oneida Alar

## 2015-10-14 ENCOUNTER — Ambulatory Visit: Payer: BLUE CROSS/BLUE SHIELD | Admitting: Vascular Surgery

## 2015-10-18 ENCOUNTER — Ambulatory Visit (INDEPENDENT_AMBULATORY_CARE_PROVIDER_SITE_OTHER): Payer: BLUE CROSS/BLUE SHIELD | Admitting: Gastroenterology

## 2015-10-18 ENCOUNTER — Encounter: Payer: Self-pay | Admitting: Gastroenterology

## 2015-10-18 VITALS — BP 116/68 | HR 76 | Ht 71.5 in | Wt 246.1 lb

## 2015-10-18 DIAGNOSIS — N184 Chronic kidney disease, stage 4 (severe): Secondary | ICD-10-CM

## 2015-10-18 DIAGNOSIS — Z1211 Encounter for screening for malignant neoplasm of colon: Secondary | ICD-10-CM

## 2015-10-18 DIAGNOSIS — G4733 Obstructive sleep apnea (adult) (pediatric): Secondary | ICD-10-CM

## 2015-10-18 DIAGNOSIS — I480 Paroxysmal atrial fibrillation: Secondary | ICD-10-CM | POA: Diagnosis not present

## 2015-10-18 DIAGNOSIS — Z9989 Dependence on other enabling machines and devices: Secondary | ICD-10-CM
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
## 2015-10-18 NOTE — Patient Instructions (Addendum)
We will contact you in mid November 2017 to schedule a colonoscopy.  Please call us if you have not heard from our office by the end of November regarding scheduling a colonoscopy. [phone] (ask for Vivien Rota)  If you are age 59 or older, your body mass index should be between 23-30. Your Body mass index is 33.85 kg/m. If this is out of the aforementioned range listed, please consider follow up with your Primary Care Provider.  If you are age 46 or younger, your body mass index should be between 19-25. Your Body mass index is 33.85 kg/m. If this is out of the aformentioned range listed, please consider follow up with your Primary Care Provider.   Thank you for choosing [facility]  Dr Wilfrid Lund III

## 2015-10-29 ENCOUNTER — Encounter (HOSPITAL_COMMUNITY): Payer: Self-pay | Admitting: *Deleted

## 2015-10-29 NOTE — Progress Notes (Signed)
Patient denies chest pain, shortness of breath. Cardiologist Dr. Harrington Challenger, last OV 04/2015. Reports that we have a record of all testing.

## 2015-10-31 NOTE — Anesthesia Preprocedure Evaluation (Addendum)
Anesthesia Evaluation  Patient identified by MRN, date of birth, ID band Patient awake    Reviewed: Allergy & Precautions, NPO status , Patient's Chart, lab work & pertinent test results  History of Anesthesia Complications Negative for: history of anesthetic complications  Airway Mallampati: III   Neck ROM: Full    Dental  (+) Dental Advisory Given, Chipped, Poor Dentition   Pulmonary sleep apnea , COPD, former smoker,    breath sounds clear to auscultation       Cardiovascular hypertension,  Rhythm:Regular Rate:Normal     Neuro/Psych    GI/Hepatic negative GI ROS, Neg liver ROS,   Endo/Other  diabetes  Renal/GU CRFRenal disease     Musculoskeletal   Abdominal   Peds  Hematology   Anesthesia Other Findings   Reproductive/Obstetrics                            Anesthesia Physical Anesthesia Plan  ASA: III  Anesthesia Plan: General   Post-op Pain Management:    Induction:   Airway Management Planned: LMA  Additional Equipment:   Intra-op Plan:   Post-operative Plan: Extubation in OR  Informed Consent: I have reviewed the patients History and Physical, chart, labs and discussed the procedure including the risks, benefits and alternatives for the proposed anesthesia with the patient or authorized representative who has indicated his/her understanding and acceptance.     Plan Discussed with: CRNA  Anesthesia Plan Comments:         Anesthesia Quick Evaluation

## 2015-11-01 ENCOUNTER — Other Ambulatory Visit: Payer: Self-pay

## 2015-11-01 ENCOUNTER — Ambulatory Visit (HOSPITAL_COMMUNITY): Payer: BLUE CROSS/BLUE SHIELD | Admitting: Anesthesiology

## 2015-11-01 ENCOUNTER — Encounter (HOSPITAL_COMMUNITY)
Admission: RE | Disposition: A | Discharge status: Home / Self Care | Payer: Self-pay | Source: Ambulatory Visit | Attending: Vascular Surgery

## 2015-11-01 ENCOUNTER — Telehealth: Payer: Self-pay | Admitting: Vascular Surgery

## 2015-11-01 ENCOUNTER — Encounter (HOSPITAL_COMMUNITY): Payer: Self-pay | Admitting: *Deleted

## 2015-11-01 ENCOUNTER — Ambulatory Visit (HOSPITAL_COMMUNITY)
Admission: RE | Admit: 2015-11-01 | Discharge: 2015-11-01 | Disposition: A | Discharge status: Home / Self Care | Payer: BLUE CROSS/BLUE SHIELD | Source: Ambulatory Visit | Attending: Vascular Surgery | Admitting: Vascular Surgery

## 2015-11-01 DIAGNOSIS — Z48812 Encounter for surgical aftercare following surgery on the circulatory system: Secondary | ICD-10-CM

## 2015-11-01 DIAGNOSIS — N185 Chronic kidney disease, stage 5: Secondary | ICD-10-CM | POA: Diagnosis not present

## 2015-11-01 DIAGNOSIS — N186 End stage renal disease: Secondary | ICD-10-CM | POA: Insufficient documentation

## 2015-11-01 DIAGNOSIS — I120 Hypertensive chronic kidney disease with stage 5 chronic kidney disease or end stage renal disease: Secondary | ICD-10-CM | POA: Diagnosis not present

## 2015-11-01 DIAGNOSIS — Z87891 Personal history of nicotine dependence: Secondary | ICD-10-CM | POA: Insufficient documentation

## 2015-11-01 DIAGNOSIS — G4730 Sleep apnea, unspecified: Secondary | ICD-10-CM | POA: Insufficient documentation

## 2015-11-01 DIAGNOSIS — J449 Chronic obstructive pulmonary disease, unspecified: Secondary | ICD-10-CM | POA: Diagnosis not present

## 2015-11-01 DIAGNOSIS — E1122 Type 2 diabetes mellitus with diabetic chronic kidney disease: Secondary | ICD-10-CM | POA: Diagnosis not present

## 2015-11-01 HISTORY — DX: Type 2 diabetes mellitus without complications: E11.9

## 2015-11-01 HISTORY — PX: BASCILIC VEIN TRANSPOSITION: SHX5742

## 2015-11-01 LAB — GLUCOSE, CAPILLARY
GLUCOSE-CAPILLARY: 100 mg/dL — AB (ref 65–99)
GLUCOSE-CAPILLARY: 108 mg/dL — AB (ref 65–99)

## 2015-11-01 LAB — POCT I-STAT 4, (NA,K, GLUC, HGB,HCT)
Glucose, Bld: 100 mg/dL — ABNORMAL HIGH (ref 65–99)
HEMATOCRIT: 36 % — AB (ref 39.0–52.0)
Hemoglobin: 12.2 g/dL — ABNORMAL LOW (ref 13.0–17.0)
Potassium: 4.1 mmol/L (ref 3.5–5.1)
SODIUM: 138 mmol/L (ref 135–145)

## 2015-11-01 LAB — APTT: APTT: 29 s (ref 24–36)

## 2015-11-01 LAB — PROTIME-INR
INR: 1.25
Prothrombin Time: 15.8 seconds — ABNORMAL HIGH (ref 11.4–15.2)

## 2015-11-01 SURGERY — TRANSPOSITION, VEIN, BASILIC
Anesthesia: General | Site: Arm Upper | Laterality: Left

## 2015-11-01 MED ORDER — TRAMADOL HCL 50 MG PO TABS: 50.0000 mg | ORAL_TABLET | Freq: Three times a day (TID) | ORAL | 0 refills | Status: DC | PRN

## 2015-11-01 MED ORDER — PROMETHAZINE HCL 25 MG/ML IJ SOLN: 6.2500 mg | INTRAMUSCULAR | Status: DC | PRN

## 2015-11-01 MED ORDER — WARFARIN SODIUM 2.5 MG PO TABS: 2.5000 mg | ORAL_TABLET | ORAL | Status: DC

## 2015-11-01 MED ORDER — PHENYLEPHRINE HCL 10 MG/ML IJ SOLN
INTRAMUSCULAR | Status: AC
  Filled 2015-11-01: qty 1

## 2015-11-01 MED ORDER — PROTAMINE SULFATE 10 MG/ML IV SOLN
INTRAVENOUS | Status: DC | PRN
  Administered 2015-11-01: 10 mg via INTRAVENOUS
  Administered 2015-11-01: 40 mg via INTRAVENOUS

## 2015-11-01 MED ORDER — HYDROMORPHONE HCL 1 MG/ML IJ SOLN
INTRAMUSCULAR | Status: DC
Start: 2015-11-01 — End: 2015-11-01
  Filled 2015-11-01: qty 0.5

## 2015-11-01 MED ORDER — PROPOFOL 10 MG/ML IV BOLUS
INTRAVENOUS | Status: DC | PRN
  Administered 2015-11-01: 20 mg via INTRAVENOUS
  Administered 2015-11-01: 150 mg via INTRAVENOUS

## 2015-11-01 MED ORDER — CHLORHEXIDINE GLUCONATE CLOTH 2 % EX PADS: 6.0000 | MEDICATED_PAD | Freq: Once | CUTANEOUS | Status: DC

## 2015-11-01 MED ORDER — FENTANYL CITRATE (PF) 100 MCG/2ML IJ SOLN
INTRAMUSCULAR | Status: DC | PRN
  Administered 2015-11-01 (×6): 25 ug via INTRAVENOUS
  Administered 2015-11-01: 50 ug via INTRAVENOUS

## 2015-11-01 MED ORDER — HYDROMORPHONE HCL 1 MG/ML IJ SOLN
0.2500 mg | INTRAMUSCULAR | Status: DC | PRN
  Administered 2015-11-01: 0.5 mg via INTRAVENOUS

## 2015-11-01 MED ORDER — HEPARIN SODIUM (PORCINE) 1000 UNIT/ML IJ SOLN
INTRAMUSCULAR | Status: DC | PRN
  Administered 2015-11-01: 5000 [IU] via INTRAVENOUS

## 2015-11-01 MED ORDER — PROPOFOL 10 MG/ML IV BOLUS
INTRAVENOUS | Status: AC
  Filled 2015-11-01: qty 20

## 2015-11-01 MED ORDER — FENTANYL CITRATE (PF) 100 MCG/2ML IJ SOLN
INTRAMUSCULAR | Status: AC
  Filled 2015-11-01: qty 2

## 2015-11-01 MED ORDER — DEXTROSE 5 % IV SOLN
1.5000 g | INTRAVENOUS | Status: AC
  Administered 2015-11-01: 1.5 g via INTRAVENOUS
  Filled 2015-11-01: qty 1.5

## 2015-11-01 MED ORDER — 0.9 % SODIUM CHLORIDE (POUR BTL) OPTIME
TOPICAL | Status: DC | PRN
  Administered 2015-11-01: 1000 mL

## 2015-11-01 MED ORDER — MIDAZOLAM HCL 2 MG/2ML IJ SOLN
INTRAMUSCULAR | Status: AC
  Filled 2015-11-01: qty 2

## 2015-11-01 MED ORDER — SODIUM CHLORIDE 0.9 % IV SOLN
INTRAVENOUS | Status: DC
  Administered 2015-11-01: 07:00:00 via INTRAVENOUS

## 2015-11-01 MED ORDER — LIDOCAINE HCL (CARDIAC) 20 MG/ML IV SOLN
INTRAVENOUS | Status: DC | PRN
  Administered 2015-11-01: 50 mg via INTRAVENOUS

## 2015-11-01 MED ORDER — ONDANSETRON HCL 4 MG/2ML IJ SOLN
INTRAMUSCULAR | Status: AC
  Filled 2015-11-01: qty 2

## 2015-11-01 MED ORDER — MIDAZOLAM HCL 5 MG/5ML IJ SOLN
INTRAMUSCULAR | Status: DC | PRN
  Administered 2015-11-01: 1 mg via INTRAVENOUS

## 2015-11-01 MED ORDER — ONDANSETRON HCL 4 MG/2ML IJ SOLN
INTRAMUSCULAR | Status: DC | PRN
  Administered 2015-11-01: 4 mg via INTRAVENOUS

## 2015-11-01 MED ORDER — HEPARIN SODIUM (PORCINE) 5000 UNIT/ML IJ SOLN
INTRAMUSCULAR | Status: DC | PRN
  Administered 2015-11-01: 500 mL

## 2015-11-01 MED ORDER — PHENYLEPHRINE HCL 10 MG/ML IJ SOLN
INTRAVENOUS | Status: DC | PRN
  Administered 2015-11-01: 30 ug/min via INTRAVENOUS

## 2015-11-01 SURGICAL SUPPLY — 38 items
ADH SKN CLS APL DERMABOND .7 (GAUZE/BANDAGES/DRESSINGS) ×2
AGENT HMST SPONGE THK3/8 (HEMOSTASIS)
ARMBAND PINK RESTRICT EXTREMIT (MISCELLANEOUS) ×2 IMPLANT
CANISTER SUCTION 2500CC (MISCELLANEOUS) ×2 IMPLANT
CANNULA VESSEL 3MM 2 BLNT TIP (CANNULA) ×1 IMPLANT
CLIP TI MEDIUM 24 (CLIP) ×2 IMPLANT
CLIP TI WIDE RED SMALL 24 (CLIP) ×2 IMPLANT
COVER PROBE W GEL 5X96 (DRAPES) ×2 IMPLANT
DERMABOND ADVANCED (GAUZE/BANDAGES/DRESSINGS) ×2
DERMABOND ADVANCED .7 DNX12 (GAUZE/BANDAGES/DRESSINGS) ×1 IMPLANT
ELECT REM PT RETURN 9FT ADLT (ELECTROSURGICAL) ×2
ELECTRODE REM PT RTRN 9FT ADLT (ELECTROSURGICAL) ×1 IMPLANT
GLOVE BIO SURGEON STRL SZ 6.5 (GLOVE) ×2 IMPLANT
GLOVE BIO SURGEON STRL SZ7 (GLOVE) ×1 IMPLANT
GLOVE BIO SURGEON STRL SZ7.5 (GLOVE) ×2 IMPLANT
GLOVE BIOGEL PI IND STRL 6.5 (GLOVE) IMPLANT
GLOVE BIOGEL PI IND STRL 7.0 (GLOVE) IMPLANT
GLOVE BIOGEL PI INDICATOR 6.5 (GLOVE) ×1
GLOVE BIOGEL PI INDICATOR 7.0 (GLOVE) ×3
GLOVE SURG SS PI 6.5 STRL IVOR (GLOVE) ×2 IMPLANT
GOWN STRL REUS W/ TWL LRG LVL3 (GOWN DISPOSABLE) ×3 IMPLANT
GOWN STRL REUS W/TWL LRG LVL3 (GOWN DISPOSABLE) ×10
HEMOSTAT SPONGE AVITENE ULTRA (HEMOSTASIS) IMPLANT
KIT BASIN OR (CUSTOM PROCEDURE TRAY) ×2 IMPLANT
KIT ROOM TURNOVER OR (KITS) ×2 IMPLANT
NS IRRIG 1000ML POUR BTL (IV SOLUTION) ×2 IMPLANT
PACK CV ACCESS (CUSTOM PROCEDURE TRAY) ×2 IMPLANT
PAD ARMBOARD 7.5X6 YLW CONV (MISCELLANEOUS) ×4 IMPLANT
SUT PROLENE 7 0 BV 1 (SUTURE) ×2 IMPLANT
SUT SILK 2 0 SH (SUTURE) ×1 IMPLANT
SUT SILK 3 0 (SUTURE) ×2
SUT SILK 3-0 18XBRD TIE 12 (SUTURE) IMPLANT
SUT VIC AB 3-0 SH 27 (SUTURE) ×8
SUT VIC AB 3-0 SH 27X BRD (SUTURE) ×1 IMPLANT
SUT VICRYL 4-0 PS2 18IN ABS (SUTURE) ×5 IMPLANT
TAPE UMBILICAL COTTON 1/8X30 (MISCELLANEOUS) ×1 IMPLANT
UNDERPAD 30X30 (UNDERPADS AND DIAPERS) ×2 IMPLANT
WATER STERILE IRR 1000ML POUR (IV SOLUTION) ×2 IMPLANT

## 2015-11-01 NOTE — Telephone Encounter (Signed)
Spoke to pt on home #, will mail letter for appt on 11/30 for [place] and f/u

## 2015-11-01 NOTE — Anesthesia Postprocedure Evaluation (Signed)
Anesthesia Post Note  Patient: Kevin James  Procedure(s) Performed: Procedure(s) (LRB): LEFT UPPER ARM BASILIC VEIN TRANSPOSITION (Left)  Patient location during evaluation: PACU Anesthesia Type: General Level of consciousness: awake and alert Pain management: pain level controlled Vital Signs Assessment: post-procedure vital signs reviewed and stable Respiratory status: spontaneous breathing, nonlabored ventilation, respiratory function stable and patient connected to nasal cannula oxygen Cardiovascular status: blood pressure returned to baseline and stable Postop Assessment: no signs of nausea or vomiting Anesthetic complications: no    Last Vitals:  Vitals:   11/01/15 1126 11/01/15 1139  BP:  (!) 100/58  Pulse:  62  Resp:    Temp: 36.3 C     Last Pain:  Vitals:   11/01/15 1030  TempSrc:   PainSc: 10-Worst pain ever                 Madyn Ivins,JAMES TERRILL

## 2015-11-01 NOTE — Anesthesia Procedure Notes (Signed)
Procedure Name: LMA Insertion Date/Time: 11/01/2015 7:34 AM Performed by: Gaylene Brooks Pre-anesthesia Checklist: Patient identified, Emergency Drugs available, Suction available and Patient being monitored Patient Re-evaluated:Patient Re-evaluated prior to inductionOxygen Delivery Method: Circle System Utilized Preoxygenation: Pre-oxygenation with 100% oxygen Intubation Type: IV induction Ventilation: Mask ventilation without difficulty LMA: LMA inserted LMA Size: 5.0 Number of attempts: 1 Placement Confirmation: positive ETCO2 Tube secured with: Tape Dental Injury: Teeth and Oropharynx as per pre-operative assessment

## 2015-11-01 NOTE — H&P (View-Only) (Signed)
    Postoperative Access Visit   History of Present Illness  Kevin James is a 60 y.o. year old malepatient that is s/p left radiocephalic arteriovenous fistula cannulation under ultrasound guidance, left arm shuntogram, and Venoplasty of cephalic vein (6 mm x 309 mm) on 09/15/15 by Dr. Bridgett Larsson for non-maturation of left radiocephalic arteriovenous fistula. FINDING(S): 1. Patent central venous structions 2. Right internal jugular vein tunneled dialysis catheter in right atrium 3. Patent subclavian vein, axillary, and brachial veins 4. Upper arm cephalic vein is occluded with only collateral veins evident Patent left radiocephalic arteriovenous fistula: maximal diameter 4.8 mm, distal segment stenoses ? obvious waists resolved with venoplasty, improved diameters post-venoplasty > 5.5-6.0 mm.  He had a dialysis access duplex on 10/11/15 and returns today for discussion of this with Dr. Bridgett Larsson, but Dr. Bridgett Larsson is not in the office today, and pt cannot come to our office on a Monday, Wednesday, or Friday since he has HD all day on those days, but speaking with pt and wife today, he finishes HD at about 11:30 in the morning, right side chest tunneled dialysis catheter is used.   The patient's right forearm incision is healed.  The patient notes no steal symptoms.  The patient is able to complete their activities of daily living.   For VQI Use Only  PRE-ADM LIVING: Home  AMB STATUS: Ambulatory  Physical Examination Vitals:   10/13/15 0910  BP: 109/69  Pulse: (!) 102  Resp: 20  Temp: 98.1 F (36.7 C)  TempSrc: Oral  SpO2: 98%  Weight: 245 lb 11.2 oz (111.4 kg)  Height: 6\' 1"  (1.854 m)   Body mass index is 32.42 kg/m.  Left UE: Incision is healed, skin feels normal and warm, hand grip is 5/5, sensation in digits is  intact, palpable thrill over AV fistula. Left radial pulse is 1-2+ palpable.  Medical Decision Making  Kevin James is a 60 y.o. year old male who presents s/p left  radiocephalic arteriovenous fistula cannulation under ultrasound guidance, left arm shuntogram, and Venoplasty of cephalic vein (6 mm x 407 mm) on 09/15/15 by Dr. Bridgett Larsson for non-maturation of left radiocephalic arteriovenous fistula.  He takes coumadin for paroxysmal atrial fib.   Dr. Oneida Alar spoke with pt and wife and examined pt after he viewed the results of 10/11/15 AVF duplex.  Left radio cephalic AVF is too small for HD; will schedule creation of left basilic vein transposition fistula with general anesthesia on 11/01/15 by Dr. Oneida Alar  Thank you for allowing [place] to participate in this patient's care.  Hilja Kintzel, Sharmon Leyden, RN, MSN, FNP-C [facility] Office: [phone]  10/13/2015, 9:51 AM  Clinic MD: Oneida Alar

## 2015-11-01 NOTE — Telephone Encounter (Signed)
-----   Message from Denman George, RN sent at 11/01/2015 12:11 PM EDT ----- Regarding: needs 4-6 week access duplex and f/u with Dr. Oneida Alar   ----- Message ----- From: Alvia Grove, PA-C Sent: 11/01/2015   9:52 AM To: Vvs Charge Pool  S/p left basilic vein transposition 11/01/15  F/u with Dr. Oneida Alar in 4-6 weeks with duplex.   Thanks Maudie Mercury

## 2015-11-01 NOTE — Transfer of Care (Signed)
Immediate Anesthesia Transfer of Care Note  Patient: Kevin James  Procedure(s) Performed: Procedure(s): LEFT UPPER ARM BASILIC VEIN TRANSPOSITION (Left)  Patient Location: PACU  Anesthesia Type:General  Level of Consciousness: awake, alert  and oriented  Airway & Oxygen Therapy: Patient Spontanous Breathing and Patient connected to nasal cannula oxygen  Post-op Assessment: Report given to RN, Post -op Vital signs reviewed and stable and Patient moving all extremities X 4  Post vital signs: Reviewed and stable  Last Vitals:  Vitals:   11/01/15 0652  BP: 129/68  Pulse: 69  Resp: 20  Temp: 36.6 C    Last Pain:  Vitals:   11/01/15 0652  TempSrc: Oral      Patients Stated Pain Goal: 2 (18/55/01 5868)  Complications: No apparent anesthesia complications

## 2015-11-01 NOTE — Op Note (Signed)
Procedure: Left basilic vein transposition fistula  Preoperative diagnosis: End-stage renal disease  Postoperative diagnosis: Same  Anesthesia: Gen.  Assistant: Silva Bandy PA-C  Operative findings: 3-5 mm left basilic vein, 3 mm left brachial artery  Operative details: After obtaining informed consent, the patient was taken to the operating room. The patient was placed in supine position operating table. After induction of general anesthesia, the patient's entire left upper extremity was prepped and draped in the usual sterile fashion. Next ultrasound was used to identify the left basilic vein. A longitudinal incision was made just below the antecubital crease in order to expose the basilic vein. The vein was of good quality approximately 3-5 mm in diameter throughout its course. Several longitudinal skip incisions were made up the arm from just below the antecubital crease all the way up to the axilla to harvest the basilic vein. Care was taken to try to not injure any sensory nerves and all the motor nerves were identified and protected. The vein was dissected free circumferentially and small side branches ligated and divided between silk ties or clips. Next the brachial artery was exposed by deepening the basilic vein harvest incision just above the antecubital crease. The artery was approximately 3 mm in diameter. This was dissected free circumferentially. Vessel loops were placed around it. There was some spasm within the artery after dissecting it free. Next the distal basilic vein was ligated with a 2 0 silk tie and the vein transected. The vein was brought out throughout the skip incisions and gently distended with heparinized saline and marked for orientation. The vein was then tunneled subcutaneously in an arcing configuration out over the biceps muscle down to the level of the exposed brachial artery just above the antecubital crease. The patient was given 5000 units of intravenous heparin.  Vessel loops were used to control the artery proximally and distally. The vein was cut to length and sewn end of vein to side of artery using a running 7-0 Prolene suture. Just prior completion of the anastomosis, it was forebled backbled and thoroughly flushed. The anastomosis was secured Vesseloops were released.    There was a palpable thrill immediately in the proximal aspect of the fistula.  The distal vein was also noted to fill fully. At this point all subcutaneous tissues were reapproximated using running 3-0 Vicryl suture. All skin incisions were closed with running 4  0 Vicryl subcuticular stitch. Dermabond was applied to all incisions. The patient tolerated the procedure well and there were no complications. Instrument sponge needle counts were correct at the end of the case. The patient was taken to the recovery room in stable condition.  Ruta Hinds, MD [facility] Office: [phone] Pager: [phone]

## 2015-11-01 NOTE — Interval H&P Note (Signed)
History and Physical Interval Note:  11/01/2015 7:19 AM  Kevin James  has presented today for surgery, with the diagnosis of Inadequate size of left arm radiocephalic arteriovenous fistula T82.590A; End Stage Renal Disease N18.6  The various methods of treatment have been discussed with the patient and family. After consideration of risks, benefits and other options for treatment, the patient has consented to  Procedure(s): BASILIC VEIN TRANSPOSITION (Left) as a surgical intervention .  The patient's history has been reviewed, patient examined, no change in status, stable for surgery.  I have reviewed the patient's chart and labs.  Questions were answered to the patient's satisfaction.     Ruta Hinds

## 2015-11-02 ENCOUNTER — Encounter (HOSPITAL_COMMUNITY): Payer: Self-pay | Admitting: Vascular Surgery

## 2015-11-24 ENCOUNTER — Encounter (INDEPENDENT_AMBULATORY_CARE_PROVIDER_SITE_OTHER): Payer: Self-pay

## 2015-11-24 ENCOUNTER — Encounter: Payer: Self-pay | Admitting: Internal Medicine

## 2015-11-24 ENCOUNTER — Ambulatory Visit (INDEPENDENT_AMBULATORY_CARE_PROVIDER_SITE_OTHER): Payer: BLUE CROSS/BLUE SHIELD | Admitting: Internal Medicine

## 2015-11-24 VITALS — BP 110/74 | HR 111 | Ht 73.0 in | Wt 254.4 lb

## 2015-11-24 DIAGNOSIS — I480 Paroxysmal atrial fibrillation: Secondary | ICD-10-CM
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
## 2015-11-24 NOTE — Patient Instructions (Signed)
Your physician recommends that you continue on your current medications as directed. Please refer to the Current Medication list given to you today.  Your physician has recommended that you wear a holter monitor. Holter monitors are medical devices that record the heart's electrical activity. Doctors most often use these monitors to diagnose arrhythmias. Arrhythmias are problems with the speed or rhythm of the heartbeat. The monitor is a small, portable device. You can wear one while you do your normal daily activities. This is usually used to diagnose what is causing palpitations/syncope (passing out).  Your physician recommends that you schedule a follow-up appointment in: 2-3 months with Dr. Harrington Challenger.

## 2015-11-30 ENCOUNTER — Encounter: Payer: Self-pay | Admitting: Vascular Surgery

## 2015-12-08 ENCOUNTER — Ambulatory Visit (INDEPENDENT_AMBULATORY_CARE_PROVIDER_SITE_OTHER): Payer: BLUE CROSS/BLUE SHIELD

## 2015-12-08 ENCOUNTER — Encounter: Payer: Self-pay | Admitting: Vascular Surgery

## 2015-12-08 ENCOUNTER — Ambulatory Visit (HOSPITAL_COMMUNITY)
Admission: RE | Admit: 2015-12-08 | Discharge: 2015-12-08 | Disposition: A | Discharge status: Home / Self Care | Payer: BLUE CROSS/BLUE SHIELD | Source: Ambulatory Visit | Attending: Vascular Surgery | Admitting: Vascular Surgery

## 2015-12-08 ENCOUNTER — Ambulatory Visit (INDEPENDENT_AMBULATORY_CARE_PROVIDER_SITE_OTHER): Payer: Self-pay | Admitting: Vascular Surgery

## 2015-12-08 VITALS — BP 109/76 | HR 119 | Temp 100.2°F | Resp 20 | Ht 73.0 in | Wt 253.0 lb

## 2015-12-08 DIAGNOSIS — Z48812 Encounter for surgical aftercare following surgery on the circulatory system: Secondary | ICD-10-CM | POA: Diagnosis not present

## 2015-12-08 DIAGNOSIS — N186 End stage renal disease: Secondary | ICD-10-CM | POA: Insufficient documentation

## 2015-12-08 DIAGNOSIS — I480 Paroxysmal atrial fibrillation: Secondary | ICD-10-CM | POA: Diagnosis not present

## 2015-12-08 DIAGNOSIS — Z992 Dependence on renal dialysis: Secondary | ICD-10-CM

## 2015-12-08 NOTE — Progress Notes (Signed)
Patient is a 60 year old male who returns for postoperative follow-up today after left basilic vein transposition fistula October 24.  Exam: The fistula is easily visible on the skin surface. There is an easily palpable thrill audible bruit no evidence of steal left hand. All incisions are well-healed.  Assessment: Maturing AV fistula left arm. Follow-up as needed  Ruta Hinds, MD [facility] Office: [phone] Pager: [phone]

## 2015-12-13 ENCOUNTER — Ambulatory Visit (INDEPENDENT_AMBULATORY_CARE_PROVIDER_SITE_OTHER): Payer: BLUE CROSS/BLUE SHIELD | Admitting: Neurology

## 2015-12-13 ENCOUNTER — Encounter: Payer: Self-pay | Admitting: Neurology

## 2015-12-13 VITALS — BP 118/65 | HR 115 | Resp 20 | Ht 73.0 in | Wt 255.0 lb

## 2015-12-13 DIAGNOSIS — G4761 Periodic limb movement disorder: Secondary | ICD-10-CM

## 2015-12-13 DIAGNOSIS — G4733 Obstructive sleep apnea (adult) (pediatric): Secondary | ICD-10-CM

## 2015-12-13 DIAGNOSIS — R635 Abnormal weight gain: Secondary | ICD-10-CM

## 2015-12-13 DIAGNOSIS — Z9989 Dependence on other enabling machines and devices: Secondary | ICD-10-CM

## 2015-12-13 MED ORDER — ROPINIROLE HCL 0.25 MG PO TABS: ORAL_TABLET | ORAL | 5 refills | Status: DC

## 2015-12-13 NOTE — Patient Instructions (Addendum)
We will continue with CPAP at the current pressure 8 cm.  For your leg pain and leg twitching at night, we will try Requip (generic name: ropinirole) 0.25 mg: Take one pill each night for 1 week, then 2 pills each night for 1 week, then 3 pills each night for 1 week thereafter. Take 90-120 minutes before projected bedtime. Common side effects reported are: Sedation, sleepiness, nausea, vomiting, and rare side effects are confusion, hallucinations, swelling in legs, and abnormal behaviors, including impulse control problems, which can manifest as excessive eating, obsessions with food or gambling, or hypersexuality.  We have room to increase the dose if necessary. The medication can make you sleepy, so at least in the beginning (1-2 weeks), try to avoid combining with the Ambien. Also, please make sure your INR is checked regularly, as the new medication can change the warfarin effect a little bit.
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
## 2015-12-15 ENCOUNTER — Telehealth: Payer: Self-pay

## 2015-12-15 NOTE — Telephone Encounter (Signed)
Contacted pt to schedule his colonoscopy for transplant requirements. Pt declined to schedule at this time as he is too busy and took himself off the transplant list temporarily. He will call if he decides to proceed.

## 2015-12-15 NOTE — Telephone Encounter (Signed)
Understood.  We will await his call.

## 2016-01-12 ENCOUNTER — Other Ambulatory Visit: Payer: Self-pay | Admitting: *Deleted

## 2016-01-12 MED ORDER — METOPROLOL TARTRATE 25 MG PO TABS: 25.0000 mg | ORAL_TABLET | Freq: Two times a day (BID) | ORAL | 11 refills | Status: DC

## 2016-02-08 DIAGNOSIS — E1129 Type 2 diabetes mellitus with other diabetic kidney complication: Secondary | ICD-10-CM | POA: Diagnosis not present

## 2016-02-08 DIAGNOSIS — N186 End stage renal disease: Secondary | ICD-10-CM | POA: Diagnosis not present

## 2016-02-08 DIAGNOSIS — Z992 Dependence on renal dialysis: Secondary | ICD-10-CM | POA: Diagnosis not present
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
## 2016-02-23 ENCOUNTER — Ambulatory Visit (INDEPENDENT_AMBULATORY_CARE_PROVIDER_SITE_OTHER): Payer: Self-pay | Admitting: Internal Medicine

## 2016-02-23 ENCOUNTER — Encounter: Payer: Self-pay | Admitting: Internal Medicine

## 2016-02-23 ENCOUNTER — Encounter (INDEPENDENT_AMBULATORY_CARE_PROVIDER_SITE_OTHER): Payer: Self-pay

## 2016-02-23 VITALS — BP 110/66 | HR 69 | Ht 73.0 in | Wt 262.0 lb

## 2016-02-23 DIAGNOSIS — I480 Paroxysmal atrial fibrillation: Secondary | ICD-10-CM

## 2016-02-23 NOTE — Patient Instructions (Signed)
Your physician recommends that you continue on your current medications as directed. Please refer to the Current Medication list given to you today.  Your physician has requested that you have an echocardiogram. Echocardiography is a painless test that uses sound waves to create images of your heart. It provides your doctor with information about the size and shape of your heart and how well your heart's chambers and valves are working. This procedure takes approximately one hour. There are no restrictions for this procedure.  Your physician recommends that you schedule a follow-up appointment in: June, 2018 with Dr. Harrington Challenger.

## 2016-03-06 DIAGNOSIS — Z452 Encounter for adjustment and management of vascular access device: Secondary | ICD-10-CM | POA: Diagnosis not present

## 2016-03-07 DIAGNOSIS — N186 End stage renal disease: Secondary | ICD-10-CM | POA: Diagnosis not present

## 2016-03-07 DIAGNOSIS — Z992 Dependence on renal dialysis: Secondary | ICD-10-CM | POA: Diagnosis not present

## 2016-03-07 DIAGNOSIS — E1129 Type 2 diabetes mellitus with other diabetic kidney complication: Secondary | ICD-10-CM | POA: Diagnosis not present

## 2016-03-13 ENCOUNTER — Other Ambulatory Visit: Payer: Self-pay

## 2016-03-13 ENCOUNTER — Ambulatory Visit (HOSPITAL_COMMUNITY): Payer: BLUE CROSS/BLUE SHIELD | Attending: Cardiology

## 2016-03-13 DIAGNOSIS — I4891 Unspecified atrial fibrillation: Secondary | ICD-10-CM | POA: Diagnosis present

## 2016-03-13 DIAGNOSIS — I517 Cardiomegaly: Secondary | ICD-10-CM | POA: Insufficient documentation

## 2016-03-13 DIAGNOSIS — I480 Paroxysmal atrial fibrillation: Secondary | ICD-10-CM | POA: Diagnosis not present

## 2016-03-13 DIAGNOSIS — G4733 Obstructive sleep apnea (adult) (pediatric): Secondary | ICD-10-CM | POA: Diagnosis not present

## 2016-03-13 DIAGNOSIS — E119 Type 2 diabetes mellitus without complications: Secondary | ICD-10-CM | POA: Insufficient documentation

## 2016-03-15 ENCOUNTER — Encounter: Payer: Self-pay | Admitting: Neurology

## 2016-03-15 ENCOUNTER — Ambulatory Visit (INDEPENDENT_AMBULATORY_CARE_PROVIDER_SITE_OTHER): Payer: Medicare Other | Admitting: Neurology

## 2016-03-15 VITALS — BP 144/90 | HR 80 | Resp 20 | Ht 73.0 in | Wt 260.0 lb

## 2016-03-15 DIAGNOSIS — G4761 Periodic limb movement disorder: Secondary | ICD-10-CM

## 2016-03-15 DIAGNOSIS — I482 Chronic atrial fibrillation, unspecified: Secondary | ICD-10-CM

## 2016-03-15 DIAGNOSIS — Z9989 Dependence on other enabling machines and devices: Secondary | ICD-10-CM

## 2016-03-15 DIAGNOSIS — G2581 Restless legs syndrome: Secondary | ICD-10-CM | POA: Diagnosis not present

## 2016-03-15 DIAGNOSIS — G629 Polyneuropathy, unspecified: Secondary | ICD-10-CM

## 2016-03-15 DIAGNOSIS — G4733 Obstructive sleep apnea (adult) (pediatric): Secondary | ICD-10-CM

## 2016-03-15 MED ORDER — GABAPENTIN 100 MG PO CAPS: 100.0000 mg | ORAL_CAPSULE | Freq: Every day | ORAL | 5 refills | Status: DC
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
## 2016-03-15 NOTE — Patient Instructions (Addendum)
We will turn off the heated hose, and the ramp time. We will reduce the humidity level of the CPAP to lowest. Pressure is adequate and air leakage is low.   Please continue using your CPAP regularly. While your insurance requires that you use CPAP at least 4 hours each night on 70% of the nights, I recommend, that you not skip any nights and use it throughout the night if you can. Getting used to CPAP and staying with the treatment long term does take time and patience and discipline. Untreated obstructive sleep apnea when it is moderate to severe can have an adverse impact on cardiovascular health and raise her risk for heart disease, arrhythmias, hypertension, congestive heart failure, stroke and diabetes. Untreated obstructive sleep apnea causes sleep disruption, nonrestorative sleep, and sleep deprivation. This can have an impact on your day to day functioning and cause daytime sleepiness and impairment of cognitive function, memory loss, mood disturbance, and problems focussing. Using CPAP regularly can improve these symptoms.  We will see you in 6 months, you can see one of our nurse practitioners.   We will stop the requip, we will start you on low dose Neurontin (gabapentin) 100 mg strength: Take 1 pill nightly at bedtime for your restless legs and neuropathy. We have to use the lowest dose due to your kidney failure. The most common side effects reported are sedation or sleepiness. Rare side effects include balance problems, confusion.

## 2016-04-07 DIAGNOSIS — Z992 Dependence on renal dialysis: Secondary | ICD-10-CM | POA: Diagnosis not present

## 2016-04-07 DIAGNOSIS — N186 End stage renal disease: Secondary | ICD-10-CM | POA: Diagnosis not present

## 2016-04-07 DIAGNOSIS — E1129 Type 2 diabetes mellitus with other diabetic kidney complication: Secondary | ICD-10-CM | POA: Diagnosis not present

## 2016-05-01 DIAGNOSIS — N186 End stage renal disease: Secondary | ICD-10-CM | POA: Diagnosis not present

## 2016-05-01 DIAGNOSIS — T82858A Stenosis of vascular prosthetic devices, implants and grafts, initial encounter: Secondary | ICD-10-CM | POA: Diagnosis not present

## 2016-05-01 DIAGNOSIS — I871 Compression of vein: Secondary | ICD-10-CM | POA: Diagnosis not present

## 2016-05-01 DIAGNOSIS — Z992 Dependence on renal dialysis: Secondary | ICD-10-CM | POA: Diagnosis not present
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
## 2016-07-05 ENCOUNTER — Encounter: Payer: Self-pay | Admitting: Internal Medicine

## 2016-07-05 ENCOUNTER — Ambulatory Visit (INDEPENDENT_AMBULATORY_CARE_PROVIDER_SITE_OTHER): Payer: BLUE CROSS/BLUE SHIELD | Admitting: Internal Medicine

## 2016-07-05 ENCOUNTER — Encounter (INDEPENDENT_AMBULATORY_CARE_PROVIDER_SITE_OTHER): Payer: Self-pay

## 2016-07-05 VITALS — BP 120/62 | HR 113 | Ht 74.0 in | Wt 265.0 lb

## 2016-07-05 DIAGNOSIS — I480 Paroxysmal atrial fibrillation: Secondary | ICD-10-CM

## 2016-07-05 DIAGNOSIS — E782 Mixed hyperlipidemia: Secondary | ICD-10-CM

## 2016-07-05 DIAGNOSIS — R000 Tachycardia, unspecified: Secondary | ICD-10-CM

## 2016-07-05 NOTE — Patient Instructions (Signed)
Your physician recommends that you continue on your current medications as directed. Please refer to the Current Medication list given to you today.  Your physician has recommended that you wear a holter monitor. Holter monitors are medical devices that record the heart's electrical activity. Doctors most often use these monitors to diagnose arrhythmias. Arrhythmias are problems with the speed or rhythm of the heartbeat. The monitor is a small, portable device. You can wear one while you do your normal daily activities. This is usually used to diagnose what is causing palpitations/syncope (passing out).  Your physician recommends that you schedule a follow-up appointment in: November with Dr. Harrington Challenger.
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
## 2016-07-17 ENCOUNTER — Ambulatory Visit (INDEPENDENT_AMBULATORY_CARE_PROVIDER_SITE_OTHER): Payer: BLUE CROSS/BLUE SHIELD

## 2016-07-17 ENCOUNTER — Other Ambulatory Visit: Payer: Self-pay | Admitting: Internal Medicine

## 2016-07-17 DIAGNOSIS — I4891 Unspecified atrial fibrillation: Secondary | ICD-10-CM | POA: Diagnosis not present

## 2016-07-17 DIAGNOSIS — R000 Tachycardia, unspecified: Secondary | ICD-10-CM

## 2016-08-02 ENCOUNTER — Telehealth: Payer: Self-pay | Admitting: *Deleted

## 2016-08-02 MED ORDER — METOPROLOL TARTRATE 50 MG PO TABS: 50.0000 mg | ORAL_TABLET | Freq: Two times a day (BID) | ORAL | 3 refills | Status: DC

## 2016-08-02 NOTE — Telephone Encounter (Signed)
-----   Message from Dorris Carnes V, MD sent at 07/23/2016 11:03 PM EDT ----- Holter shows afib with average HR not controlled   Recomm increasing b blocker to 50 mg bid

## 2016-08-02 NOTE — Telephone Encounter (Signed)
Study Highlights   Atrial fibrillation  72 to 161 bpm  Average HR 115 bpm   Rare PVC     Spoke with patient's wife (DPR), advised to increase metoprolol to 50 mg BID.  Will send new prescription to [facility] as requested.

## 2016-08-23 ENCOUNTER — Other Ambulatory Visit: Payer: Self-pay

## 2016-08-23 DIAGNOSIS — T82858D Stenosis of vascular prosthetic devices, implants and grafts, subsequent encounter: Secondary | ICD-10-CM

## 2016-09-10 ENCOUNTER — Other Ambulatory Visit: Payer: Self-pay | Admitting: Neurology

## 2016-09-10 DIAGNOSIS — G2581 Restless legs syndrome: Secondary | ICD-10-CM

## 2016-09-10 DIAGNOSIS — G4761 Periodic limb movement disorder: Secondary | ICD-10-CM

## 2016-09-10 DIAGNOSIS — G4733 Obstructive sleep apnea (adult) (pediatric): Secondary | ICD-10-CM

## 2016-09-10 DIAGNOSIS — Z9989 Dependence on other enabling machines and devices: Principal | ICD-10-CM

## 2016-09-10 DIAGNOSIS — I482 Chronic atrial fibrillation, unspecified: Secondary | ICD-10-CM

## 2016-09-10 DIAGNOSIS — I480 Paroxysmal atrial fibrillation: Secondary | ICD-10-CM | POA: Diagnosis not present

## 2016-09-10 DIAGNOSIS — G629 Polyneuropathy, unspecified: Secondary | ICD-10-CM

## 2016-09-17 ENCOUNTER — Encounter: Payer: Self-pay | Admitting: Adult Health

## 2016-09-17 DIAGNOSIS — I480 Paroxysmal atrial fibrillation: Secondary | ICD-10-CM | POA: Diagnosis not present

## 2016-09-18 ENCOUNTER — Ambulatory Visit (INDEPENDENT_AMBULATORY_CARE_PROVIDER_SITE_OTHER): Payer: Medicare Other | Admitting: Adult Health

## 2016-09-18 ENCOUNTER — Encounter: Payer: Self-pay | Admitting: Adult Health

## 2016-09-18 DIAGNOSIS — Z9989 Dependence on other enabling machines and devices: Secondary | ICD-10-CM

## 2016-09-18 DIAGNOSIS — G4733 Obstructive sleep apnea (adult) (pediatric): Secondary | ICD-10-CM

## 2016-09-18 DIAGNOSIS — G2581 Restless legs syndrome: Secondary | ICD-10-CM | POA: Diagnosis not present

## 2016-09-18 NOTE — Patient Instructions (Addendum)
Your Plan:  Change out supplies for CPAP- if mask continues to leak please let us know Speak to nephrologist about increasing gabapentin If your symptoms worsen or you develop new symptoms please let us know.   Thank you for coming to see [place] at [facility]. I hope we have been able to provide you high quality care today.  You may receive a patient satisfaction survey over the next few weeks. We would appreciate your feedback and comments so that we may continue to improve ourselves and the health of our patients.
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
## 2016-10-03 DIAGNOSIS — I480 Paroxysmal atrial fibrillation: Secondary | ICD-10-CM | POA: Diagnosis not present

## 2016-10-07 DIAGNOSIS — E1129 Type 2 diabetes mellitus with other diabetic kidney complication: Secondary | ICD-10-CM | POA: Diagnosis not present

## 2016-10-07 DIAGNOSIS — Z992 Dependence on renal dialysis: Secondary | ICD-10-CM | POA: Diagnosis not present

## 2016-10-07 DIAGNOSIS — N186 End stage renal disease: Secondary | ICD-10-CM | POA: Diagnosis not present

## 2016-10-08 DIAGNOSIS — N186 End stage renal disease: Secondary | ICD-10-CM | POA: Diagnosis not present

## 2016-10-08 DIAGNOSIS — Z23 Encounter for immunization: Secondary | ICD-10-CM | POA: Diagnosis not present

## 2016-10-08 DIAGNOSIS — N2581 Secondary hyperparathyroidism of renal origin: Secondary | ICD-10-CM | POA: Diagnosis not present

## 2016-10-08 DIAGNOSIS — E119 Type 2 diabetes mellitus without complications: Secondary | ICD-10-CM | POA: Diagnosis not present

## 2016-10-08 DIAGNOSIS — D509 Iron deficiency anemia, unspecified: Secondary | ICD-10-CM | POA: Diagnosis not present

## 2016-10-09 DIAGNOSIS — M109 Gout, unspecified: Secondary | ICD-10-CM | POA: Diagnosis not present

## 2016-10-09 DIAGNOSIS — E1129 Type 2 diabetes mellitus with other diabetic kidney complication: Secondary | ICD-10-CM | POA: Diagnosis not present

## 2016-10-09 DIAGNOSIS — E7849 Other hyperlipidemia: Secondary | ICD-10-CM | POA: Diagnosis not present

## 2016-10-09 DIAGNOSIS — Z125 Encounter for screening for malignant neoplasm of prostate: Secondary | ICD-10-CM | POA: Diagnosis not present

## 2016-10-10 DIAGNOSIS — N186 End stage renal disease: Secondary | ICD-10-CM | POA: Diagnosis not present

## 2016-10-10 DIAGNOSIS — N2581 Secondary hyperparathyroidism of renal origin: Secondary | ICD-10-CM | POA: Diagnosis not present

## 2016-10-10 DIAGNOSIS — E119 Type 2 diabetes mellitus without complications: Secondary | ICD-10-CM | POA: Diagnosis not present

## 2016-10-10 DIAGNOSIS — D509 Iron deficiency anemia, unspecified: Secondary | ICD-10-CM | POA: Diagnosis not present

## 2016-10-10 DIAGNOSIS — Z23 Encounter for immunization: Secondary | ICD-10-CM | POA: Diagnosis not present

## 2016-10-11 ENCOUNTER — Ambulatory Visit (HOSPITAL_COMMUNITY)
Admission: RE | Admit: 2016-10-11 | Discharge: 2016-10-11 | Disposition: A | Discharge status: Home / Self Care | Payer: Medicare Other | Source: Ambulatory Visit | Attending: Vascular Surgery | Admitting: Vascular Surgery

## 2016-10-11 ENCOUNTER — Other Ambulatory Visit: Payer: Self-pay | Admitting: *Deleted

## 2016-10-11 ENCOUNTER — Ambulatory Visit (INDEPENDENT_AMBULATORY_CARE_PROVIDER_SITE_OTHER): Payer: Medicare Other | Admitting: Vascular Surgery

## 2016-10-11 ENCOUNTER — Encounter: Payer: Self-pay | Admitting: *Deleted

## 2016-10-11 ENCOUNTER — Encounter: Payer: Self-pay | Admitting: Vascular Surgery

## 2016-10-11 ENCOUNTER — Ambulatory Visit (INDEPENDENT_AMBULATORY_CARE_PROVIDER_SITE_OTHER)
Admission: RE | Admit: 2016-10-11 | Discharge: 2016-10-11 | Disposition: A | Discharge status: Home / Self Care | Payer: Medicare Other | Source: Ambulatory Visit | Attending: Vascular Surgery | Admitting: Vascular Surgery

## 2016-10-11 VITALS — BP 97/63 | HR 100 | Resp 16 | Ht 72.75 in | Wt 270.6 lb

## 2016-10-11 DIAGNOSIS — Z01818 Encounter for other preprocedural examination: Secondary | ICD-10-CM | POA: Insufficient documentation

## 2016-10-11 DIAGNOSIS — Z992 Dependence on renal dialysis: Secondary | ICD-10-CM | POA: Diagnosis not present

## 2016-10-11 DIAGNOSIS — Y838 Other surgical procedures as the cause of abnormal reaction of the patient, or of later complication, without mention of misadventure at the time of the procedure: Secondary | ICD-10-CM | POA: Insufficient documentation

## 2016-10-11 DIAGNOSIS — T82858D Stenosis of vascular prosthetic devices, implants and grafts, subsequent encounter: Secondary | ICD-10-CM

## 2016-10-11 DIAGNOSIS — N186 End stage renal disease: Secondary | ICD-10-CM | POA: Diagnosis not present
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
## 2016-10-12 DIAGNOSIS — Z23 Encounter for immunization: Secondary | ICD-10-CM | POA: Diagnosis not present

## 2016-10-12 DIAGNOSIS — N2581 Secondary hyperparathyroidism of renal origin: Secondary | ICD-10-CM | POA: Diagnosis not present

## 2016-10-12 DIAGNOSIS — D509 Iron deficiency anemia, unspecified: Secondary | ICD-10-CM | POA: Diagnosis not present

## 2016-10-12 DIAGNOSIS — N186 End stage renal disease: Secondary | ICD-10-CM | POA: Diagnosis not present

## 2016-10-12 DIAGNOSIS — E119 Type 2 diabetes mellitus without complications: Secondary | ICD-10-CM | POA: Diagnosis not present

## 2016-10-15 DIAGNOSIS — N2581 Secondary hyperparathyroidism of renal origin: Secondary | ICD-10-CM | POA: Diagnosis not present

## 2016-10-15 DIAGNOSIS — D509 Iron deficiency anemia, unspecified: Secondary | ICD-10-CM | POA: Diagnosis not present

## 2016-10-15 DIAGNOSIS — E119 Type 2 diabetes mellitus without complications: Secondary | ICD-10-CM | POA: Diagnosis not present

## 2016-10-15 DIAGNOSIS — N186 End stage renal disease: Secondary | ICD-10-CM | POA: Diagnosis not present

## 2016-10-15 DIAGNOSIS — Z23 Encounter for immunization: Secondary | ICD-10-CM | POA: Diagnosis not present

## 2016-10-16 ENCOUNTER — Encounter: Payer: Self-pay | Admitting: Nephrology

## 2016-10-16 DIAGNOSIS — Z0000 Encounter for general adult medical examination without abnormal findings: Secondary | ICD-10-CM | POA: Diagnosis not present

## 2016-10-16 DIAGNOSIS — R3914 Feeling of incomplete bladder emptying: Secondary | ICD-10-CM | POA: Diagnosis not present

## 2016-10-16 DIAGNOSIS — I4891 Unspecified atrial fibrillation: Secondary | ICD-10-CM | POA: Diagnosis not present

## 2016-10-16 DIAGNOSIS — E1151 Type 2 diabetes mellitus with diabetic peripheral angiopathy without gangrene: Secondary | ICD-10-CM | POA: Diagnosis not present

## 2016-10-16 DIAGNOSIS — E1129 Type 2 diabetes mellitus with other diabetic kidney complication: Secondary | ICD-10-CM | POA: Diagnosis not present

## 2016-10-16 DIAGNOSIS — R3121 Asymptomatic microscopic hematuria: Secondary | ICD-10-CM | POA: Diagnosis not present

## 2016-10-16 DIAGNOSIS — Z7901 Long term (current) use of anticoagulants: Secondary | ICD-10-CM | POA: Diagnosis not present

## 2016-10-16 DIAGNOSIS — G4709 Other insomnia: Secondary | ICD-10-CM | POA: Diagnosis not present

## 2016-10-16 DIAGNOSIS — Z1389 Encounter for screening for other disorder: Secondary | ICD-10-CM | POA: Diagnosis not present

## 2016-10-16 DIAGNOSIS — N186 End stage renal disease: Secondary | ICD-10-CM | POA: Diagnosis not present

## 2016-10-16 DIAGNOSIS — Z6836 Body mass index (BMI) 36.0-36.9, adult: Secondary | ICD-10-CM | POA: Diagnosis not present

## 2016-10-16 DIAGNOSIS — D631 Anemia in chronic kidney disease: Secondary | ICD-10-CM | POA: Diagnosis not present

## 2016-10-17 DIAGNOSIS — N2581 Secondary hyperparathyroidism of renal origin: Secondary | ICD-10-CM | POA: Diagnosis not present

## 2016-10-17 DIAGNOSIS — D509 Iron deficiency anemia, unspecified: Secondary | ICD-10-CM | POA: Diagnosis not present

## 2016-10-17 DIAGNOSIS — E119 Type 2 diabetes mellitus without complications: Secondary | ICD-10-CM | POA: Diagnosis not present

## 2016-10-17 DIAGNOSIS — N186 End stage renal disease: Secondary | ICD-10-CM | POA: Diagnosis not present

## 2016-10-17 DIAGNOSIS — Z23 Encounter for immunization: Secondary | ICD-10-CM | POA: Diagnosis not present

## 2016-10-19 DIAGNOSIS — D509 Iron deficiency anemia, unspecified: Secondary | ICD-10-CM | POA: Diagnosis not present

## 2016-10-19 DIAGNOSIS — Z23 Encounter for immunization: Secondary | ICD-10-CM | POA: Diagnosis not present

## 2016-10-19 DIAGNOSIS — N2581 Secondary hyperparathyroidism of renal origin: Secondary | ICD-10-CM | POA: Diagnosis not present

## 2016-10-19 DIAGNOSIS — E119 Type 2 diabetes mellitus without complications: Secondary | ICD-10-CM | POA: Diagnosis not present

## 2016-10-19 DIAGNOSIS — N186 End stage renal disease: Secondary | ICD-10-CM | POA: Diagnosis not present

## 2016-10-20 ENCOUNTER — Encounter (HOSPITAL_COMMUNITY): Payer: Self-pay | Admitting: *Deleted

## 2016-10-20 NOTE — Progress Notes (Signed)
Patient denies chest pain, shortness of breath, palpitations. Reports that he normally takes his morning medications at 12:30- 13:00 and night medications around 18:00- 18:30. I requested that patient his metoprolol am of surgery before arrival.   Reports last dose of Coumadin on 10/18/2016. Fasting CBG 100- 120 Below instructions given for DM per guidelines.   . If your blood sugar is less than 70 mg/dL, you will need to treat for low blood sugar: o Do not take insulin. o Treat a low blood sugar (less than 70 mg/dL) with  cup of clear juice (cranberry or apple), 4 glucose tablets, OR glucose gel. o Recheck blood sugar in 15 minutes after treatment (to make sure it is greater than 70 mg/dL). If your blood sugar is not greater than 70 mg/dL on recheck, call [phone] for further instructions. . Report your blood sugar to the short stay nurse when you get to Short Stay.

## 2016-10-22 DIAGNOSIS — N186 End stage renal disease: Secondary | ICD-10-CM | POA: Diagnosis not present

## 2016-10-22 DIAGNOSIS — E119 Type 2 diabetes mellitus without complications: Secondary | ICD-10-CM | POA: Diagnosis not present

## 2016-10-22 DIAGNOSIS — Z23 Encounter for immunization: Secondary | ICD-10-CM | POA: Diagnosis not present

## 2016-10-22 DIAGNOSIS — D509 Iron deficiency anemia, unspecified: Secondary | ICD-10-CM | POA: Diagnosis not present

## 2016-10-22 DIAGNOSIS — N2581 Secondary hyperparathyroidism of renal origin: Secondary | ICD-10-CM | POA: Diagnosis not present

## 2016-10-22 NOTE — Progress Notes (Signed)
Anesthesia Chart Review: SAME DAY WORK-UP.  Patient is a 61 year old male scheduled for creation of right radiocephalic AVF on 01/74/94 by Dr. Ruta Hinds.  History includes former smoker (quit '14), PAF/afib, HTN, HLD, COPD, DM2 (diet controlled), ESRD (hemodialysis MWF, [place]; failed LUE [place] AVF and failing left basilic vein transposition), secondary hyperparathyroidism, anemia, OSA (CPAP).  - PCP is Dr. Crist Infante. - Cardiologist is Dr. Sharen Heck, last visit 07/05/16. Holter ordered to better assess afib rate control. This showed average HR of 115 bpm, so metoprolol dose increased. - Neurologist is Dr. Star Age (for OSA).  Medications include Lipitor, Neurontin, Lopressor, omega-3 fatty acids, Renvela, verapamil, warfarin (last dose 10/18/16).   EKG 07/05/16: Afib with RVR, rate @ 113 bpm.   Echo 03/13/16: Study Conclusions - Left ventricle: The cavity size was normal. There was moderate   concentric hypertrophy. Systolic function was normal. The   estimated ejection fraction was in the range of 55% to 60%. Wall   motion was normal; there were no regional wall motion   abnormalities. The study was not technically sufficient to allow   evaluation of LV diastolic dysfunction due to atrial   fibrillation. - Aortic valve: There was no regurgitation. - Aortic root: The aortic root was normal in size. - Mitral valve: Structurally normal valve. - Left atrium: The atrium was normal in size. - Right ventricle: Systolic function was normal. - Tricuspid valve: There was no regurgitation. - Pulmonary arteries: Systolic pressure could not be accurately   estimated. - Inferior vena cava: The vessel was normal in size. The   respirophasic diameter changes were in the normal range (= 50%),   consistent with normal central venous pressure. - Pericardium, extracardiac: There was no pericardial effusion. Impressions: - Normal biventricular size and function. Moderate  concentric LVH.   Normal EVSP.   No significant difference from the prior study on 07/19/2014.  Holter monitor 07/17/16-07/19/16: Atrial fibrillation  72 to 161 bpm  Average HR 115 bpm. Rare PVC.  Echo 07/19/14:  - Left ventricle: The cavity size was normal. Wall thickness was increased in a pattern of mild LVH. Systolic function was normal. The estimated ejection fraction was in the range of 55% to 60%. Wall motion was normal; there were no regional wall motion abnormalities. Doppler parameters are consistent with abnormal left ventricular relaxation (grade 1 diastolic dysfunction). - Impressions: Technically difficult; definity used. Normal LV function; grade 1 diastolic dysfunction; trace TR.  He will get labs on the day of surgery. He was instructed to take metoprolol on the morning of surgery. (HR was documented as 73 bpm on 09/18/16.)  Myra Gianotti, [facility] Short Stay Center/Anesthesiology Phone [phone] 10/22/2016 12:52 PM

## 2016-10-23 ENCOUNTER — Ambulatory Visit (HOSPITAL_COMMUNITY)
Admission: RE | Admit: 2016-10-23 | Discharge: 2016-10-23 | Disposition: A | Discharge status: Home / Self Care | Payer: Medicare Other | Source: Ambulatory Visit | Attending: Vascular Surgery | Admitting: Vascular Surgery

## 2016-10-23 ENCOUNTER — Telehealth: Payer: Self-pay | Admitting: Vascular Surgery

## 2016-10-23 ENCOUNTER — Ambulatory Visit (HOSPITAL_COMMUNITY): Payer: Medicare Other | Admitting: Vascular Surgery

## 2016-10-23 ENCOUNTER — Encounter (HOSPITAL_COMMUNITY): Payer: Self-pay | Admitting: *Deleted

## 2016-10-23 ENCOUNTER — Encounter (HOSPITAL_COMMUNITY)
Admission: RE | Disposition: A | Discharge status: Home / Self Care | Payer: Self-pay | Source: Ambulatory Visit | Attending: Vascular Surgery

## 2016-10-23 DIAGNOSIS — M109 Gout, unspecified: Secondary | ICD-10-CM | POA: Insufficient documentation

## 2016-10-23 DIAGNOSIS — Z992 Dependence on renal dialysis: Secondary | ICD-10-CM | POA: Diagnosis not present

## 2016-10-23 DIAGNOSIS — N2581 Secondary hyperparathyroidism of renal origin: Secondary | ICD-10-CM | POA: Diagnosis not present

## 2016-10-23 DIAGNOSIS — I480 Paroxysmal atrial fibrillation: Secondary | ICD-10-CM | POA: Diagnosis not present

## 2016-10-23 DIAGNOSIS — Z8249 Family history of ischemic heart disease and other diseases of the circulatory system: Secondary | ICD-10-CM | POA: Insufficient documentation

## 2016-10-23 DIAGNOSIS — Z6835 Body mass index (BMI) 35.0-35.9, adult: Secondary | ICD-10-CM | POA: Diagnosis not present

## 2016-10-23 DIAGNOSIS — E1122 Type 2 diabetes mellitus with diabetic chronic kidney disease: Secondary | ICD-10-CM | POA: Diagnosis not present

## 2016-10-23 DIAGNOSIS — E119 Type 2 diabetes mellitus without complications: Secondary | ICD-10-CM | POA: Diagnosis not present

## 2016-10-23 DIAGNOSIS — I120 Hypertensive chronic kidney disease with stage 5 chronic kidney disease or end stage renal disease: Secondary | ICD-10-CM | POA: Insufficient documentation

## 2016-10-23 DIAGNOSIS — Z7901 Long term (current) use of anticoagulants: Secondary | ICD-10-CM | POA: Diagnosis not present

## 2016-10-23 DIAGNOSIS — J449 Chronic obstructive pulmonary disease, unspecified: Secondary | ICD-10-CM | POA: Diagnosis not present

## 2016-10-23 DIAGNOSIS — Z833 Family history of diabetes mellitus: Secondary | ICD-10-CM | POA: Diagnosis not present

## 2016-10-23 DIAGNOSIS — Z87891 Personal history of nicotine dependence: Secondary | ICD-10-CM | POA: Diagnosis not present

## 2016-10-23 DIAGNOSIS — N186 End stage renal disease: Secondary | ICD-10-CM | POA: Diagnosis not present

## 2016-10-23 DIAGNOSIS — E669 Obesity, unspecified: Secondary | ICD-10-CM | POA: Diagnosis not present

## 2016-10-23 DIAGNOSIS — N185 Chronic kidney disease, stage 5: Secondary | ICD-10-CM | POA: Diagnosis not present

## 2016-10-23 DIAGNOSIS — G4733 Obstructive sleep apnea (adult) (pediatric): Secondary | ICD-10-CM | POA: Diagnosis not present

## 2016-10-23 DIAGNOSIS — E785 Hyperlipidemia, unspecified: Secondary | ICD-10-CM | POA: Diagnosis not present

## 2016-10-23 DIAGNOSIS — Z1212 Encounter for screening for malignant neoplasm of rectum: Secondary | ICD-10-CM | POA: Diagnosis not present

## 2016-10-23 HISTORY — PX: AV FISTULA PLACEMENT: SHX1204

## 2016-10-23 LAB — GLUCOSE, CAPILLARY
GLUCOSE-CAPILLARY: 101 mg/dL — AB (ref 65–99)
GLUCOSE-CAPILLARY: 97 mg/dL (ref 65–99)

## 2016-10-23 LAB — POCT I-STAT 4, (NA,K, GLUC, HGB,HCT)
GLUCOSE: 118 mg/dL — AB (ref 65–99)
HEMATOCRIT: 41 % (ref 39.0–52.0)
Hemoglobin: 13.9 g/dL (ref 13.0–17.0)
Potassium: 4.3 mmol/L (ref 3.5–5.1)
Sodium: 136 mmol/L (ref 135–145)

## 2016-10-23 LAB — PROTIME-INR
INR: 1.27
Prothrombin Time: 15.8 seconds — ABNORMAL HIGH (ref 11.4–15.2)

## 2016-10-23 SURGERY — ARTERIOVENOUS (AV) FISTULA CREATION
Anesthesia: Monitor Anesthesia Care | Site: Arm Lower | Laterality: Right

## 2016-10-23 MED ORDER — 0.9 % SODIUM CHLORIDE (POUR BTL) OPTIME
TOPICAL | Status: DC | PRN
  Administered 2016-10-23: 1000 mL

## 2016-10-23 MED ORDER — COLCHICINE 0.6 MG PO TABS
0.6000 mg | ORAL_TABLET | ORAL | Status: DC
Start: 2016-10-23 — End: 2019-01-29

## 2016-10-23 MED ORDER — DEXTROSE 5 % IV SOLN
INTRAVENOUS | Status: AC
  Filled 2016-10-23: qty 1.5

## 2016-10-23 MED ORDER — MEPERIDINE HCL 25 MG/ML IJ SOLN: 6.2500 mg | INTRAMUSCULAR | Status: DC | PRN

## 2016-10-23 MED ORDER — FENTANYL CITRATE (PF) 100 MCG/2ML IJ SOLN
INTRAMUSCULAR | Status: DC | PRN
  Administered 2016-10-23: 50 ug via INTRAVENOUS

## 2016-10-23 MED ORDER — SODIUM CHLORIDE 0.9 % IV SOLN
INTRAVENOUS | Status: DC
  Administered 2016-10-23: 08:00:00 via INTRAVENOUS

## 2016-10-23 MED ORDER — TRAMADOL HCL 50 MG PO TABS: 50.0000 mg | ORAL_TABLET | Freq: Four times a day (QID) | ORAL | 0 refills | Status: DC | PRN

## 2016-10-23 MED ORDER — HEPARIN SODIUM (PORCINE) 1000 UNIT/ML IJ SOLN
INTRAMUSCULAR | Status: AC
  Filled 2016-10-23: qty 1

## 2016-10-23 MED ORDER — LIDOCAINE HCL (PF) 1 % IJ SOLN
INTRAMUSCULAR | Status: DC | PRN
  Administered 2016-10-23: 8 mL

## 2016-10-23 MED ORDER — PROTAMINE SULFATE 10 MG/ML IV SOLN
INTRAVENOUS | Status: DC | PRN
  Administered 2016-10-23: 50 mg via INTRAVENOUS

## 2016-10-23 MED ORDER — PROTAMINE SULFATE 10 MG/ML IV SOLN
INTRAVENOUS | Status: AC
  Filled 2016-10-23: qty 5

## 2016-10-23 MED ORDER — DEXTROSE 5 % IV SOLN
1.5000 g | INTRAVENOUS | Status: AC
  Administered 2016-10-23: 1.5 g via INTRAVENOUS

## 2016-10-23 MED ORDER — PROMETHAZINE HCL 25 MG/ML IJ SOLN: 6.2500 mg | INTRAMUSCULAR | Status: DC | PRN

## 2016-10-23 MED ORDER — MIDAZOLAM HCL 2 MG/2ML IJ SOLN: 0.5000 mg | Freq: Once | INTRAMUSCULAR | Status: DC | PRN

## 2016-10-23 MED ORDER — ONDANSETRON HCL 4 MG/2ML IJ SOLN
INTRAMUSCULAR | Status: AC
  Filled 2016-10-23: qty 2

## 2016-10-23 MED ORDER — ONDANSETRON HCL 4 MG/2ML IJ SOLN
INTRAMUSCULAR | Status: DC | PRN
  Administered 2016-10-23: 4 mg via INTRAVENOUS

## 2016-10-23 MED ORDER — FENTANYL CITRATE (PF) 100 MCG/2ML IJ SOLN: 25.0000 ug | INTRAMUSCULAR | Status: DC | PRN

## 2016-10-23 MED ORDER — SODIUM CHLORIDE 0.9 % IV SOLN: INTRAVENOUS | Status: DC

## 2016-10-23 MED ORDER — MIDAZOLAM HCL 2 MG/2ML IJ SOLN
INTRAMUSCULAR | Status: AC
  Filled 2016-10-23: qty 2

## 2016-10-23 MED ORDER — MIDAZOLAM HCL 5 MG/5ML IJ SOLN
INTRAMUSCULAR | Status: DC | PRN
  Administered 2016-10-23: 2 mg via INTRAVENOUS

## 2016-10-23 MED ORDER — HEPARIN SODIUM (PORCINE) 5000 UNIT/ML IJ SOLN
INTRAMUSCULAR | Status: DC | PRN
  Administered 2016-10-23: 10:00:00 500 mL

## 2016-10-23 MED ORDER — LIDOCAINE HCL (PF) 1 % IJ SOLN
INTRAMUSCULAR | Status: AC
  Filled 2016-10-23: qty 30

## 2016-10-23 MED ORDER — PHENYLEPHRINE 40 MCG/ML (10ML) SYRINGE FOR IV PUSH (FOR BLOOD PRESSURE SUPPORT)
PREFILLED_SYRINGE | INTRAVENOUS | Status: DC | PRN
  Administered 2016-10-23 (×2): 80 ug via INTRAVENOUS

## 2016-10-23 MED ORDER — FENTANYL CITRATE (PF) 250 MCG/5ML IJ SOLN
INTRAMUSCULAR | Status: AC
  Filled 2016-10-23: qty 5

## 2016-10-23 MED ORDER — PROPOFOL 10 MG/ML IV BOLUS
INTRAVENOUS | Status: AC
  Filled 2016-10-23: qty 20

## 2016-10-23 MED ORDER — WARFARIN SODIUM 2.5 MG PO TABS: 5.0000 mg | ORAL_TABLET | Freq: Every day | ORAL | Status: DC

## 2016-10-23 MED ORDER — PHENYLEPHRINE 40 MCG/ML (10ML) SYRINGE FOR IV PUSH (FOR BLOOD PRESSURE SUPPORT)
PREFILLED_SYRINGE | INTRAVENOUS | Status: AC
  Filled 2016-10-23: qty 10

## 2016-10-23 MED ORDER — PROPOFOL 500 MG/50ML IV EMUL
INTRAVENOUS | Status: DC | PRN
  Administered 2016-10-23: 25 ug/kg/min via INTRAVENOUS

## 2016-10-23 SURGICAL SUPPLY — 35 items
ADH SKN CLS APL DERMABOND .7 (GAUZE/BANDAGES/DRESSINGS) ×1
ARMBAND PINK RESTRICT EXTREMIT (MISCELLANEOUS) ×4 IMPLANT
CANISTER SUCT 3000ML PPV (MISCELLANEOUS) ×2 IMPLANT
CANNULA VESSEL 3MM 2 BLNT TIP (CANNULA) ×2 IMPLANT
CLIP VESOCCLUDE MED 6/CT (CLIP) ×2 IMPLANT
CLIP VESOCCLUDE SM WIDE 6/CT (CLIP) ×2 IMPLANT
COVER PROBE W GEL 5X96 (DRAPES) IMPLANT
DECANTER SPIKE VIAL GLASS SM (MISCELLANEOUS) ×2 IMPLANT
DERMABOND ADVANCED (GAUZE/BANDAGES/DRESSINGS) ×1
DERMABOND ADVANCED .7 DNX12 (GAUZE/BANDAGES/DRESSINGS) ×1 IMPLANT
DRAIN PENROSE 1/4X12 LTX STRL (WOUND CARE) ×2 IMPLANT
ELECT REM PT RETURN 9FT ADLT (ELECTROSURGICAL) ×2
ELECTRODE REM PT RTRN 9FT ADLT (ELECTROSURGICAL) ×1 IMPLANT
GLOVE BIO SURGEON STRL SZ7 (GLOVE) ×1 IMPLANT
GLOVE BIO SURGEON STRL SZ7.5 (GLOVE) ×2 IMPLANT
GLOVE BIOGEL PI IND STRL 6.5 (GLOVE) IMPLANT
GLOVE BIOGEL PI IND STRL 7.0 (GLOVE) IMPLANT
GLOVE BIOGEL PI INDICATOR 6.5 (GLOVE) ×2
GLOVE BIOGEL PI INDICATOR 7.0 (GLOVE) ×1
GOWN STRL REUS W/ TWL LRG LVL3 (GOWN DISPOSABLE) ×3 IMPLANT
GOWN STRL REUS W/TWL LRG LVL3 (GOWN DISPOSABLE) ×6
KIT BASIN OR (CUSTOM PROCEDURE TRAY) ×2 IMPLANT
KIT ROOM TURNOVER OR (KITS) ×2 IMPLANT
LOOP VESSEL MINI RED (MISCELLANEOUS) IMPLANT
NS IRRIG 1000ML POUR BTL (IV SOLUTION) ×2 IMPLANT
PACK CV ACCESS (CUSTOM PROCEDURE TRAY) ×2 IMPLANT
PAD ARMBOARD 7.5X6 YLW CONV (MISCELLANEOUS) ×4 IMPLANT
SPONGE SURGIFOAM ABS GEL 100 (HEMOSTASIS) IMPLANT
SUT PROLENE 6 0 BV (SUTURE) ×1 IMPLANT
SUT PROLENE 7 0 BV 1 (SUTURE) ×2 IMPLANT
SUT VIC AB 3-0 SH 27 (SUTURE) ×2
SUT VIC AB 3-0 SH 27X BRD (SUTURE) ×1 IMPLANT
SUT VICRYL 4-0 PS2 18IN ABS (SUTURE) ×2 IMPLANT
UNDERPAD 30X30 (UNDERPADS AND DIAPERS) ×2 IMPLANT
WATER STERILE IRR 1000ML POUR (IV SOLUTION) ×2 IMPLANT

## 2016-10-23 NOTE — Telephone Encounter (Signed)
-----   Message from Mena Goes, RN sent at 10/23/2016 11:03 AM EDT ----- Regarding: 4-6 weeks w/ duplex   ----- Message ----- From: Gabriel Earing, PA-C Sent: 10/23/2016  10:30 AM To: Vvs Charge Pool  S/p right RC AVF 10/23/16.  F.u with CEF in 4-6 weeks with duplex.  Thanks

## 2016-10-23 NOTE — Anesthesia Preprocedure Evaluation (Addendum)
Anesthesia Evaluation  Patient identified by MRN, date of birth, ID band Patient awake    Reviewed: Allergy & Precautions, NPO status , Patient's Chart, lab work & pertinent test results, reviewed documented beta blocker date and time   History of Anesthesia Complications Negative for: history of anesthetic complications  Airway Mallampati: II  TM Distance: >3 FB Neck ROM: Full    Dental  (+) Poor Dentition, Loose, Missing, Dental Advisory Given   Pulmonary sleep apnea and Continuous Positive Airway Pressure Ventilation , COPD, former smoker (quit 2014),    breath sounds clear to auscultation       Cardiovascular hypertension, Pt. on medications and Pt. on home beta blockers (-) angina+ dysrhythmias Atrial Fibrillation  Rhythm:Irregular Rate:Normal  3/18 ECHO: EF 55-60%, valves OK   Neuro/Psych    GI/Hepatic Neg liver ROS, GERD  Controlled,  Endo/Other  diabetes (glu 118, controlled with dialysis)Morbid obesity  Renal/GU ESRF and DialysisRenal disease (MWF, K+ )     Musculoskeletal   Abdominal (+) + obese,   Peds  Hematology  (+) Blood dyscrasia (coumadin, INR 1.27), ,   Anesthesia Other Findings   Reproductive/Obstetrics                            Anesthesia Physical Anesthesia Plan  ASA: III  Anesthesia Plan: MAC   Post-op Pain Management:    Induction: Intravenous  PONV Risk Score and Plan: 1 and Ondansetron and Treatment may vary due to age or medical condition  Airway Management Planned: Simple Face Mask and Natural Airway  Additional Equipment:   Intra-op Plan:   Post-operative Plan:   Informed Consent: I have reviewed the patients History and Physical, chart, labs and discussed the procedure including the risks, benefits and alternatives for the proposed anesthesia with the patient or authorized representative who has indicated his/her understanding and acceptance.    Dental advisory given  Plan Discussed with: CRNA and Surgeon  Anesthesia Plan Comments: (Plan routine monitors, MAC)        Anesthesia Quick Evaluation

## 2016-10-23 NOTE — Interval H&P Note (Signed)
History and Physical Interval Note:  10/23/2016 7:19 AM  Kevin James  has presented today for surgery, with the diagnosis of End Stage Renal Disease   N18.6  The various methods of treatment have been discussed with the patient and family. After consideration of risks, benefits and other options for treatment, the patient has consented to  Procedure(s): ARTERIOVENOUS (AV) FISTULA CREATION RIGHT RADIOCEPHALIC (Right) as a surgical intervention .  The patient's history has been reviewed, patient examined, no change in status, stable for surgery.  I have reviewed the patient's chart and labs.  Questions were answered to the patient's satisfaction.     Ruta Hinds
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
## 2016-10-23 NOTE — Transfer of Care (Signed)
Immediate Anesthesia Transfer of Care Note  Patient: Kevin James  Procedure(s) Performed: RIGHT ARM ARTERIOVENOUS (AV) FISTULA CREATION RADIOCEPHALIC (Right Arm Lower)  Patient Location: PACU  Anesthesia Type:MAC  Level of Consciousness: awake, alert  and oriented  Airway & Oxygen Therapy: Patient Spontanous Breathing and Patient connected to face mask oxygen  Post-op Assessment: Report given to RN and Post -op Vital signs reviewed and stable  Post vital signs: Reviewed and stable  Last Vitals:  Vitals:   10/23/16 0721  BP: 116/77  Pulse: (!) 102  Resp: 20  Temp: 36.8 C  SpO2: 96%    Last Pain:  Vitals:   10/23/16 0721  TempSrc: Oral      Patients Stated Pain Goal: 3 (94/[id number])  Complications: No apparent anesthesia complications

## 2016-10-23 NOTE — Discharge Instructions (Signed)
° °  [facility]  Discharge Instructions  AV Fistula or Graft Surgery for Dialysis Access  Please refer to the following instructions for your post-procedure care. Your surgeon or physician assistant will discuss any changes with you.  Activity  You may drive the day following your surgery, if you are comfortable and no longer taking prescription pain medication. Resume full activity as the soreness in your incision resolves.  Bathing/Showering  You may shower after you go home. Keep your incision dry for 48 hours. Do not soak in a bathtub, hot tub, or swim until the incision heals completely. You may not shower if you have a hemodialysis catheter.  Incision Care  Clean your incision with mild soap and water after 48 hours. Pat the area dry with a clean towel. You do not need a bandage unless otherwise instructed. Do not apply any ointments or creams to your incision. You may have skin glue on your incision. Do not peel it off. It will come off on its own in about one week. Your arm may swell a bit after surgery. To reduce swelling use pillows to elevate your arm so it is above your heart. Your doctor will tell you if you need to lightly wrap your arm with an ACE bandage.  Diet  Resume your normal diet. There are not special food restrictions following this procedure. In order to heal from your surgery, it is CRITICAL to get adequate nutrition. Your body requires vitamins, minerals, and protein. Vegetables are the best source of vitamins and minerals. Vegetables also provide the perfect balance of protein. Processed food has little nutritional value, so try to avoid this.  Medications  Resume taking all of your medications. If your incision is causing pain, you may take over-the counter pain relievers such as acetaminophen (Tylenol). If you were prescribed a stronger pain medication, please be aware these medications can cause nausea and constipation. Prevent  nausea by taking the medication with a snack or meal. Avoid constipation by drinking plenty of fluids and eating foods with high amount of fiber, such as fruits, vegetables, and grains. Do not take Tylenol if you are taking prescription pain medications.  Follow up Your surgeon may want to see you in the office following your access surgery. If so, this will be arranged at the time of your surgery.  Please call us immediately for any of the following conditions:  Increased pain, redness, drainage (pus) from your incision site Fever of 101 degrees or higher Severe or worsening pain at your incision site Hand pain or numbness.  Reduce your risk of vascular disease:  Stop smoking. If you would like help, call [facility] at 1-800-QUIT-NOW 701-548-0191) or [place] at [place] your cholesterol Maintain a desired weight Control your diabetes Keep your blood pressure down  Dialysis  It will take several weeks to several months for your new dialysis access to be ready for use. Your surgeon will determine when it is OK to use it. Your nephrologist will continue to direct your dialysis. You can continue to use your Permcath until your new access is ready for use.   10/23/2016 Kevin James [id number] March 29, 1955  Surgeon(s): Fields, Jessy Oto, MD  Procedure(s): RIGHT ARM ARTERIOVENOUS (AV) FISTULA CREATION RADIOCEPHALIC  x Do not stick fistula for 12 weeks    If you have any questions, please call the office at [phone].

## 2016-10-23 NOTE — Telephone Encounter (Signed)
Sched appt 12/06/16; lab at 3:00 and MD at 3:45. Lm on hm#.

## 2016-10-23 NOTE — Anesthesia Postprocedure Evaluation (Signed)
Anesthesia Post Note  Patient: Kevin James  Procedure(s) Performed: RIGHT ARM ARTERIOVENOUS (AV) FISTULA CREATION RADIOCEPHALIC (Right Arm Lower)     Patient location during evaluation: PACU Anesthesia Type: MAC Level of consciousness: awake and alert, patient cooperative and oriented Pain management: pain level controlled Vital Signs Assessment: post-procedure vital signs reviewed and stable Respiratory status: spontaneous breathing, nonlabored ventilation, respiratory function stable and patient connected to nasal cannula oxygen Cardiovascular status: blood pressure returned to baseline and stable Postop Assessment: no apparent nausea or vomiting Anesthetic complications: no    Last Vitals:  Vitals:   10/23/16 1047 10/23/16 1102  BP: (!) 112/50 112/84  Pulse: 96 98  Resp: 19 15  Temp:  36.5 C  SpO2: 95% 97%    Last Pain:  Vitals:   10/23/16 0721  TempSrc: Oral                 Grubbs Grosser,E. Nehemias Sauceda

## 2016-10-23 NOTE — Op Note (Signed)
Procedure: Right Radial Cephalic AV fistula   Preop: ESRD   Postop: ESRD   Anesthesia: MAC with local   Assistant:Samantha Rhyne, PA-c   Findings: 3 mm cephalic vein      2.5 mm radial artery   Procedure Details:  The right upper extremity was prepped and draped in usual sterile fashion. Local anesthesia was infiltrated midway between the cephalic and radial artery anatomically. A longitudinal skin incision was then made in this location at the distal left forearm. The incision was carried into the subcutaneous tissues down to level cephalic vein. The vein had some spasm but was overall reasonable quality accepting a 3.5 mm dilator. The vein was dissected free circumferentially and small side branches ligated and divided between silk ties.  This was gently distended with heparinized saline, spatulated, and marked for orientation. Next the radial artery was dissected free in the medial portion incision. The artery was 2.5 mm in diameter but had a reasonable pulse. The vessel loops were placed proximal and distal to the planned site of arteriotomy. The patient was given 5000 units of intravenous heparin. After appropriate circulation time, the vessel loops were used to control the artery. A longitudinal opening was made in the left radial artery. The vein was controlled proximally with a fine bulldog clamp. The vein was then swung over to the artery and sewn end of vein to side of artery using a running 7-0 Prolene suture. Just prior to completion, the anastomosis was fore bled back bled and thoroughly flushed. The anastomosis was secured, vessel loops released, and there was a palpable thrill in the fistula immediately. After hemostasis was obtained, the subcutaneous tissues were reapproximated using a running 3-0 Vicryl suture. The skin was then closed with a 4 0 Vicryl subcuticular stitch. The pt was given 50 mg of protamine.  Dermabond was applied to the skin incision. The patient tolerated the  procedure well and there were no complications. Instrument sponge and needle count were correct at the end of the case. The patient was taken to PACU in stable condition.   Ruta Hinds, MD  [facility] [place]  Office: [phone]  Pager: [phone]

## 2016-10-24 ENCOUNTER — Other Ambulatory Visit: Payer: Self-pay

## 2016-10-24 ENCOUNTER — Other Ambulatory Visit: Payer: Self-pay | Admitting: Physician Assistant

## 2016-10-24 ENCOUNTER — Encounter (HOSPITAL_COMMUNITY): Payer: Self-pay | Admitting: Vascular Surgery

## 2016-10-24 DIAGNOSIS — Z23 Encounter for immunization: Secondary | ICD-10-CM | POA: Diagnosis not present

## 2016-10-24 DIAGNOSIS — N186 End stage renal disease: Secondary | ICD-10-CM

## 2016-10-24 DIAGNOSIS — Z992 Dependence on renal dialysis: Principal | ICD-10-CM

## 2016-10-24 DIAGNOSIS — D509 Iron deficiency anemia, unspecified: Secondary | ICD-10-CM | POA: Diagnosis not present

## 2016-10-24 DIAGNOSIS — N2581 Secondary hyperparathyroidism of renal origin: Secondary | ICD-10-CM | POA: Diagnosis not present

## 2016-10-24 DIAGNOSIS — E119 Type 2 diabetes mellitus without complications: Secondary | ICD-10-CM | POA: Diagnosis not present

## 2016-10-24 MED ORDER — TRAMADOL HCL 50 MG PO TABS: 50.0000 mg | ORAL_TABLET | Freq: Four times a day (QID) | ORAL | 0 refills | Status: DC | PRN

## 2016-10-26 DIAGNOSIS — E119 Type 2 diabetes mellitus without complications: Secondary | ICD-10-CM | POA: Diagnosis not present

## 2016-10-26 DIAGNOSIS — N2581 Secondary hyperparathyroidism of renal origin: Secondary | ICD-10-CM | POA: Diagnosis not present

## 2016-10-26 DIAGNOSIS — Z23 Encounter for immunization: Secondary | ICD-10-CM | POA: Diagnosis not present

## 2016-10-26 DIAGNOSIS — D509 Iron deficiency anemia, unspecified: Secondary | ICD-10-CM | POA: Diagnosis not present

## 2016-10-26 DIAGNOSIS — N186 End stage renal disease: Secondary | ICD-10-CM | POA: Diagnosis not present

## 2016-10-29 DIAGNOSIS — D509 Iron deficiency anemia, unspecified: Secondary | ICD-10-CM | POA: Diagnosis not present

## 2016-10-29 DIAGNOSIS — N186 End stage renal disease: Secondary | ICD-10-CM | POA: Diagnosis not present

## 2016-10-29 DIAGNOSIS — N2581 Secondary hyperparathyroidism of renal origin: Secondary | ICD-10-CM | POA: Diagnosis not present

## 2016-10-29 DIAGNOSIS — E119 Type 2 diabetes mellitus without complications: Secondary | ICD-10-CM | POA: Diagnosis not present

## 2016-10-29 DIAGNOSIS — Z23 Encounter for immunization: Secondary | ICD-10-CM | POA: Diagnosis not present

## 2016-10-31 DIAGNOSIS — D509 Iron deficiency anemia, unspecified: Secondary | ICD-10-CM | POA: Diagnosis not present

## 2016-10-31 DIAGNOSIS — N2581 Secondary hyperparathyroidism of renal origin: Secondary | ICD-10-CM | POA: Diagnosis not present

## 2016-10-31 DIAGNOSIS — I480 Paroxysmal atrial fibrillation: Secondary | ICD-10-CM | POA: Diagnosis not present

## 2016-10-31 DIAGNOSIS — Z23 Encounter for immunization: Secondary | ICD-10-CM | POA: Diagnosis not present

## 2016-10-31 DIAGNOSIS — N186 End stage renal disease: Secondary | ICD-10-CM | POA: Diagnosis not present

## 2016-10-31 DIAGNOSIS — E119 Type 2 diabetes mellitus without complications: Secondary | ICD-10-CM | POA: Diagnosis not present

## 2016-11-02 DIAGNOSIS — D509 Iron deficiency anemia, unspecified: Secondary | ICD-10-CM | POA: Diagnosis not present

## 2016-11-02 DIAGNOSIS — E119 Type 2 diabetes mellitus without complications: Secondary | ICD-10-CM | POA: Diagnosis not present

## 2016-11-02 DIAGNOSIS — N186 End stage renal disease: Secondary | ICD-10-CM | POA: Diagnosis not present

## 2016-11-02 DIAGNOSIS — N2581 Secondary hyperparathyroidism of renal origin: Secondary | ICD-10-CM | POA: Diagnosis not present

## 2016-11-02 DIAGNOSIS — Z23 Encounter for immunization: Secondary | ICD-10-CM | POA: Diagnosis not present

## 2016-11-04 DIAGNOSIS — S93402A Sprain of unspecified ligament of left ankle, initial encounter: Secondary | ICD-10-CM | POA: Diagnosis not present

## 2016-11-05 DIAGNOSIS — D509 Iron deficiency anemia, unspecified: Secondary | ICD-10-CM | POA: Diagnosis not present

## 2016-11-05 DIAGNOSIS — N2581 Secondary hyperparathyroidism of renal origin: Secondary | ICD-10-CM | POA: Diagnosis not present

## 2016-11-05 DIAGNOSIS — N186 End stage renal disease: Secondary | ICD-10-CM | POA: Diagnosis not present

## 2016-11-05 DIAGNOSIS — E119 Type 2 diabetes mellitus without complications: Secondary | ICD-10-CM | POA: Diagnosis not present

## 2016-11-05 DIAGNOSIS — Z23 Encounter for immunization: Secondary | ICD-10-CM | POA: Diagnosis not present

## 2016-11-07 ENCOUNTER — Other Ambulatory Visit: Payer: Self-pay

## 2016-11-07 DIAGNOSIS — N2581 Secondary hyperparathyroidism of renal origin: Secondary | ICD-10-CM | POA: Diagnosis not present

## 2016-11-07 DIAGNOSIS — E1129 Type 2 diabetes mellitus with other diabetic kidney complication: Secondary | ICD-10-CM | POA: Diagnosis not present

## 2016-11-07 DIAGNOSIS — Z48812 Encounter for surgical aftercare following surgery on the circulatory system: Secondary | ICD-10-CM

## 2016-11-07 DIAGNOSIS — Z23 Encounter for immunization: Secondary | ICD-10-CM | POA: Diagnosis not present

## 2016-11-07 DIAGNOSIS — E119 Type 2 diabetes mellitus without complications: Secondary | ICD-10-CM | POA: Diagnosis not present

## 2016-11-07 DIAGNOSIS — N186 End stage renal disease: Secondary | ICD-10-CM | POA: Diagnosis not present

## 2016-11-07 DIAGNOSIS — Z992 Dependence on renal dialysis: Secondary | ICD-10-CM | POA: Diagnosis not present

## 2016-11-07 DIAGNOSIS — D509 Iron deficiency anemia, unspecified: Secondary | ICD-10-CM | POA: Diagnosis not present

## 2016-11-09 DIAGNOSIS — E119 Type 2 diabetes mellitus without complications: Secondary | ICD-10-CM | POA: Diagnosis not present

## 2016-11-09 DIAGNOSIS — N2581 Secondary hyperparathyroidism of renal origin: Secondary | ICD-10-CM | POA: Diagnosis not present

## 2016-11-09 DIAGNOSIS — D509 Iron deficiency anemia, unspecified: Secondary | ICD-10-CM | POA: Diagnosis not present

## 2016-11-09 DIAGNOSIS — N186 End stage renal disease: Secondary | ICD-10-CM | POA: Diagnosis not present

## 2016-11-12 DIAGNOSIS — E119 Type 2 diabetes mellitus without complications: Secondary | ICD-10-CM | POA: Diagnosis not present

## 2016-11-12 DIAGNOSIS — N186 End stage renal disease: Secondary | ICD-10-CM | POA: Diagnosis not present

## 2016-11-12 DIAGNOSIS — N2581 Secondary hyperparathyroidism of renal origin: Secondary | ICD-10-CM | POA: Diagnosis not present

## 2016-11-12 DIAGNOSIS — D509 Iron deficiency anemia, unspecified: Secondary | ICD-10-CM | POA: Diagnosis not present

## 2016-11-14 DIAGNOSIS — N2581 Secondary hyperparathyroidism of renal origin: Secondary | ICD-10-CM | POA: Diagnosis not present

## 2016-11-14 DIAGNOSIS — E119 Type 2 diabetes mellitus without complications: Secondary | ICD-10-CM | POA: Diagnosis not present

## 2016-11-14 DIAGNOSIS — D509 Iron deficiency anemia, unspecified: Secondary | ICD-10-CM | POA: Diagnosis not present

## 2016-11-14 DIAGNOSIS — N186 End stage renal disease: Secondary | ICD-10-CM | POA: Diagnosis not present

## 2016-11-16 DIAGNOSIS — N186 End stage renal disease: Secondary | ICD-10-CM | POA: Diagnosis not present

## 2016-11-16 DIAGNOSIS — E119 Type 2 diabetes mellitus without complications: Secondary | ICD-10-CM | POA: Diagnosis not present

## 2016-11-16 DIAGNOSIS — N2581 Secondary hyperparathyroidism of renal origin: Secondary | ICD-10-CM | POA: Diagnosis not present

## 2016-11-16 DIAGNOSIS — D509 Iron deficiency anemia, unspecified: Secondary | ICD-10-CM | POA: Diagnosis not present

## 2016-11-19 DIAGNOSIS — N186 End stage renal disease: Secondary | ICD-10-CM | POA: Diagnosis not present

## 2016-11-19 DIAGNOSIS — Z1211 Encounter for screening for malignant neoplasm of colon: Secondary | ICD-10-CM | POA: Diagnosis not present

## 2016-11-19 DIAGNOSIS — Z1212 Encounter for screening for malignant neoplasm of rectum: Secondary | ICD-10-CM | POA: Diagnosis not present

## 2016-11-19 DIAGNOSIS — E119 Type 2 diabetes mellitus without complications: Secondary | ICD-10-CM | POA: Diagnosis not present

## 2016-11-19 DIAGNOSIS — N2581 Secondary hyperparathyroidism of renal origin: Secondary | ICD-10-CM | POA: Diagnosis not present

## 2016-11-19 DIAGNOSIS — D509 Iron deficiency anemia, unspecified: Secondary | ICD-10-CM | POA: Diagnosis not present

## 2016-11-21 DIAGNOSIS — N2581 Secondary hyperparathyroidism of renal origin: Secondary | ICD-10-CM | POA: Diagnosis not present

## 2016-11-21 DIAGNOSIS — D509 Iron deficiency anemia, unspecified: Secondary | ICD-10-CM | POA: Diagnosis not present

## 2016-11-21 DIAGNOSIS — N186 End stage renal disease: Secondary | ICD-10-CM | POA: Diagnosis not present

## 2016-11-21 DIAGNOSIS — E119 Type 2 diabetes mellitus without complications: Secondary | ICD-10-CM | POA: Diagnosis not present

## 2016-11-23 DIAGNOSIS — N2581 Secondary hyperparathyroidism of renal origin: Secondary | ICD-10-CM | POA: Diagnosis not present

## 2016-11-23 DIAGNOSIS — N186 End stage renal disease: Secondary | ICD-10-CM | POA: Diagnosis not present

## 2016-11-23 DIAGNOSIS — E119 Type 2 diabetes mellitus without complications: Secondary | ICD-10-CM | POA: Diagnosis not present

## 2016-11-23 DIAGNOSIS — D509 Iron deficiency anemia, unspecified: Secondary | ICD-10-CM | POA: Diagnosis not present

## 2016-11-26 ENCOUNTER — Encounter: Payer: Self-pay | Admitting: Internal Medicine

## 2016-11-26 DIAGNOSIS — D509 Iron deficiency anemia, unspecified: Secondary | ICD-10-CM | POA: Diagnosis not present

## 2016-11-26 DIAGNOSIS — N186 End stage renal disease: Secondary | ICD-10-CM | POA: Diagnosis not present

## 2016-11-26 DIAGNOSIS — N2581 Secondary hyperparathyroidism of renal origin: Secondary | ICD-10-CM | POA: Diagnosis not present

## 2016-11-26 DIAGNOSIS — E119 Type 2 diabetes mellitus without complications: Secondary | ICD-10-CM | POA: Diagnosis not present

## 2016-11-28 DIAGNOSIS — D509 Iron deficiency anemia, unspecified: Secondary | ICD-10-CM | POA: Diagnosis not present

## 2016-11-28 DIAGNOSIS — N2581 Secondary hyperparathyroidism of renal origin: Secondary | ICD-10-CM | POA: Diagnosis not present

## 2016-11-28 DIAGNOSIS — E119 Type 2 diabetes mellitus without complications: Secondary | ICD-10-CM | POA: Diagnosis not present

## 2016-11-28 DIAGNOSIS — N186 End stage renal disease: Secondary | ICD-10-CM | POA: Diagnosis not present

## 2016-11-30 DIAGNOSIS — N2581 Secondary hyperparathyroidism of renal origin: Secondary | ICD-10-CM | POA: Diagnosis not present

## 2016-11-30 DIAGNOSIS — N186 End stage renal disease: Secondary | ICD-10-CM | POA: Diagnosis not present

## 2016-11-30 DIAGNOSIS — E119 Type 2 diabetes mellitus without complications: Secondary | ICD-10-CM | POA: Diagnosis not present

## 2016-11-30 DIAGNOSIS — D509 Iron deficiency anemia, unspecified: Secondary | ICD-10-CM | POA: Diagnosis not present

## 2016-12-03 DIAGNOSIS — D509 Iron deficiency anemia, unspecified: Secondary | ICD-10-CM | POA: Diagnosis not present

## 2016-12-03 DIAGNOSIS — E119 Type 2 diabetes mellitus without complications: Secondary | ICD-10-CM | POA: Diagnosis not present

## 2016-12-03 DIAGNOSIS — N186 End stage renal disease: Secondary | ICD-10-CM | POA: Diagnosis not present

## 2016-12-03 DIAGNOSIS — N2581 Secondary hyperparathyroidism of renal origin: Secondary | ICD-10-CM | POA: Diagnosis not present

## 2016-12-05 DIAGNOSIS — N2581 Secondary hyperparathyroidism of renal origin: Secondary | ICD-10-CM | POA: Diagnosis not present

## 2016-12-05 DIAGNOSIS — I480 Paroxysmal atrial fibrillation: Secondary | ICD-10-CM | POA: Diagnosis not present

## 2016-12-05 DIAGNOSIS — E119 Type 2 diabetes mellitus without complications: Secondary | ICD-10-CM | POA: Diagnosis not present

## 2016-12-05 DIAGNOSIS — N186 End stage renal disease: Secondary | ICD-10-CM | POA: Diagnosis not present

## 2016-12-05 DIAGNOSIS — D509 Iron deficiency anemia, unspecified: Secondary | ICD-10-CM | POA: Diagnosis not present

## 2016-12-06 ENCOUNTER — Encounter: Payer: Self-pay | Admitting: Vascular Surgery

## 2016-12-06 ENCOUNTER — Ambulatory Visit (INDEPENDENT_AMBULATORY_CARE_PROVIDER_SITE_OTHER): Payer: Medicare Other | Admitting: Pharmacist

## 2016-12-06 ENCOUNTER — Ambulatory Visit (INDEPENDENT_AMBULATORY_CARE_PROVIDER_SITE_OTHER): Payer: Medicare Other | Admitting: Vascular Surgery

## 2016-12-06 ENCOUNTER — Ambulatory Visit (HOSPITAL_COMMUNITY)
Admission: RE | Admit: 2016-12-06 | Discharge: 2016-12-06 | Disposition: A | Discharge status: Home / Self Care | Payer: Medicare Other | Source: Ambulatory Visit | Attending: Vascular Surgery | Admitting: Vascular Surgery

## 2016-12-06 ENCOUNTER — Ambulatory Visit (INDEPENDENT_AMBULATORY_CARE_PROVIDER_SITE_OTHER): Payer: Medicare Other | Admitting: Internal Medicine

## 2016-12-06 ENCOUNTER — Encounter: Payer: Self-pay | Admitting: *Deleted

## 2016-12-06 ENCOUNTER — Encounter: Payer: Self-pay | Admitting: Internal Medicine

## 2016-12-06 VITALS — BP 131/73 | HR 107 | Temp 99.2°F | Resp 20 | Ht 73.0 in | Wt 276.0 lb

## 2016-12-06 VITALS — HR 67 | Ht 73.0 in | Wt 271.1 lb

## 2016-12-06 DIAGNOSIS — Z5181 Encounter for therapeutic drug level monitoring: Secondary | ICD-10-CM | POA: Diagnosis not present

## 2016-12-06 DIAGNOSIS — Z992 Dependence on renal dialysis: Secondary | ICD-10-CM

## 2016-12-06 DIAGNOSIS — N184 Chronic kidney disease, stage 4 (severe): Secondary | ICD-10-CM | POA: Diagnosis not present

## 2016-12-06 DIAGNOSIS — N186 End stage renal disease: Secondary | ICD-10-CM

## 2016-12-06 DIAGNOSIS — E785 Hyperlipidemia, unspecified: Secondary | ICD-10-CM

## 2016-12-06 DIAGNOSIS — R9439 Abnormal result of other cardiovascular function study: Secondary | ICD-10-CM | POA: Diagnosis not present

## 2016-12-06 DIAGNOSIS — I480 Paroxysmal atrial fibrillation: Secondary | ICD-10-CM

## 2016-12-06 DIAGNOSIS — Z48812 Encounter for surgical aftercare following surgery on the circulatory system: Secondary | ICD-10-CM

## 2016-12-06 LAB — POCT INR: INR: 2.6

## 2016-12-06 NOTE — Progress Notes (Signed)
POST OPERATIVE OFFICE NOTE    CC:  F/u for surgery  HPI:  This is a 61 y.o. male who is s/p Right radiocephalic fistula creation 10/23/16.  He denies weakness or numbness in the right UE.  His current access is a basilic vein transposition in the left arm. This was placed in 2017. He has also had a prior attempted a left radiocephalic AV fistula. He has had multiple recent percutaneous interventions in the left basilic vein transposition fistula and it is thought that he will probably need a new access as this is nearing failure.   His current access is running fine for now.      No Known Allergies  Current Outpatient Medications  Medication Sig Dispense Refill  . acetaminophen (TYLENOL) 650 MG CR tablet Take 1,300 mg by mouth every 8 (eight) hours as needed for pain.     Marland Kitchen atorvastatin (LIPITOR) 80 MG tablet Take 40 mg by mouth daily.    Marland Kitchen CINACALCET HCL PO Take 1 Dose by mouth as directed. After diaylsis    . colchicine 0.6 MG tablet Take 1 tablet (0.6 mg total) by mouth every other day.    . febuxostat (ULORIC) 40 MG tablet Take 40 mg by mouth daily with lunch.     . gabapentin (NEURONTIN) 100 MG capsule TAKE ONE CAPSULE BY MOUTH AT BEDTIME  (Patient taking differently: TAKE 100 MG BY MOUTH AT BEDTIME) 30 capsule 4  . metoprolol tartrate (LOPRESSOR) 50 MG tablet Take 1 tablet (50 mg total) by mouth 2 (two) times daily. 180 tablet 3  . OMEGA-3 FATTY ACIDS PO Take 2 capsules by mouth daily.    . sevelamer carbonate (RENVELA) 800 MG tablet Take 2,400 mg by mouth 3 (three) times daily with meals.     . traMADol (ULTRAM) 50 MG tablet Take 50 mg by mouth every 6 (six) hours as needed (pain).    . verapamil (VERELAN PM) 360 MG 24 hr capsule Take 360 mg by mouth daily with lunch.     . warfarin (COUMADIN) 2.5 MG tablet Take 2 tablets (5 mg total) by mouth daily.     No current facility-administered medications for this visit.      ROS:  See HPI  Physical Exam:  Vitals:   12/06/16  1540  BP: 131/73  Pulse: (!) 107  Resp: 20  Temp: 99.2 F (37.3 C)  SpO2: 96%    Incision:  Well healed Extremities:  Palpable thrill right av fistula, sensation and grip 5/5 intact  Fistula duplex diameter 0.36-0.6, depth <0.38   Assessment/Plan:  This is a 61 y.o. male who is s/p:Right radiocephalic av fistula creation.  The fistula is not mature enough for use and there is narrowing.  The flow rate in low at 239 ml/min.  We will schedule him for fistulogram with possible intervention with Dr. Donzetta Matters 12/11/2016.  His HD day is M-W-F.     Roxy Horseman PA-C [facility] [phone]  Clinic MD:  Fields  History and exam details as above. Patient's fistula is patent. It is palpable up in the mid forearm. However it is still fairly small in diameter and duplex suggests a mid forearm narrowing. He currently is dialyzing successfully through a left basilic vein transposition fistula but this has had multiple interventions in the past and is slowly failing. Hopefully we can get the right radiocephalic fistula matured in the near future in the event that his left arm access fails.  He is  scheduled to have a fistulogram possible intervention with my partner Dr. Donzetta Matters December 4.  Ruta Hinds, MD [facility] [place] Office: [phone] Pager: [phone]

## 2016-12-06 NOTE — Patient Instructions (Signed)
Your physician recommends that you continue on your current medications as directed. Please refer to the Current Medication list given to you today. Your physician wants you to follow-up in: 6 months with Dr. Ross.  You will receive a reminder letter in the mail two months in advance. If you don't receive a letter, please call our office to schedule the follow-up appointment.  

## 2016-12-06 NOTE — H&P (View-Only) (Signed)
POST OPERATIVE OFFICE NOTE    CC:  F/u for surgery  HPI:  This is a 61 y.o. male who is s/p Right radiocephalic fistula creation 10/23/16.  He denies weakness or numbness in the right UE.  His current access is a basilic vein transposition in the left arm. This was placed in 2017. He has also had a prior attempted a left radiocephalic AV fistula. He has had multiple recent percutaneous interventions in the left basilic vein transposition fistula and it is thought that he will probably need a new access as this is nearing failure.   His current access is running fine for now.      No Known Allergies  Current Outpatient Medications  Medication Sig Dispense Refill  . acetaminophen (TYLENOL) 650 MG CR tablet Take 1,300 mg by mouth every 8 (eight) hours as needed for pain.     Marland Kitchen atorvastatin (LIPITOR) 80 MG tablet Take 40 mg by mouth daily.    Marland Kitchen CINACALCET HCL PO Take 1 Dose by mouth as directed. After diaylsis    . colchicine 0.6 MG tablet Take 1 tablet (0.6 mg total) by mouth every other day.    . febuxostat (ULORIC) 40 MG tablet Take 40 mg by mouth daily with lunch.     . gabapentin (NEURONTIN) 100 MG capsule TAKE ONE CAPSULE BY MOUTH AT BEDTIME  (Patient taking differently: TAKE 100 MG BY MOUTH AT BEDTIME) 30 capsule 4  . metoprolol tartrate (LOPRESSOR) 50 MG tablet Take 1 tablet (50 mg total) by mouth 2 (two) times daily. 180 tablet 3  . OMEGA-3 FATTY ACIDS PO Take 2 capsules by mouth daily.    . sevelamer carbonate (RENVELA) 800 MG tablet Take 2,400 mg by mouth 3 (three) times daily with meals.     . traMADol (ULTRAM) 50 MG tablet Take 50 mg by mouth every 6 (six) hours as needed (pain).    . verapamil (VERELAN PM) 360 MG 24 hr capsule Take 360 mg by mouth daily with lunch.     . warfarin (COUMADIN) 2.5 MG tablet Take 2 tablets (5 mg total) by mouth daily.     No current facility-administered medications for this visit.      ROS:  See HPI  Physical Exam:  Vitals:   12/06/16  1540  BP: 131/73  Pulse: (!) 107  Resp: 20  Temp: 99.2 F (37.3 C)  SpO2: 96%    Incision:  Well healed Extremities:  Palpable thrill right av fistula, sensation and grip 5/5 intact  Fistula duplex diameter 0.36-0.6, depth <0.38   Assessment/Plan:  This is a 61 y.o. male who is s/p:Right radiocephalic av fistula creation.  The fistula is not mature enough for use and there is narrowing.  The flow rate in low at 239 ml/min.  We will schedule him for fistulogram with possible intervention with Dr. Donzetta Matters 12/11/2016.  His HD day is M-W-F.     Roxy Horseman PA-C [facility] [phone]  Clinic MD:  Fields  History and exam details as above. Patient's fistula is patent. It is palpable up in the mid forearm. However it is still fairly small in diameter and duplex suggests a mid forearm narrowing. He currently is dialyzing successfully through a left basilic vein transposition fistula but this has had multiple interventions in the past and is slowly failing. Hopefully we can get the right radiocephalic fistula matured in the near future in the event that his left arm access fails.  He is  scheduled to have a fistulogram possible intervention with my partner Dr. Donzetta Matters December 4.  Ruta Hinds, MD [facility] [place] Office: [phone] Pager: [phone]

## 2016-12-06 NOTE — Patient Instructions (Signed)
Continue taking 1 tablet daily. Recheck INR in 2 weeks. [phone] -coumadin clinic
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
## 2016-12-07 ENCOUNTER — Other Ambulatory Visit: Payer: Self-pay | Admitting: *Deleted

## 2016-12-07 DIAGNOSIS — Z992 Dependence on renal dialysis: Secondary | ICD-10-CM | POA: Diagnosis not present

## 2016-12-07 DIAGNOSIS — E119 Type 2 diabetes mellitus without complications: Secondary | ICD-10-CM | POA: Diagnosis not present

## 2016-12-07 DIAGNOSIS — N186 End stage renal disease: Secondary | ICD-10-CM | POA: Diagnosis not present

## 2016-12-07 DIAGNOSIS — E1129 Type 2 diabetes mellitus with other diabetic kidney complication: Secondary | ICD-10-CM | POA: Diagnosis not present

## 2016-12-07 DIAGNOSIS — N2581 Secondary hyperparathyroidism of renal origin: Secondary | ICD-10-CM | POA: Diagnosis not present

## 2016-12-07 DIAGNOSIS — D509 Iron deficiency anemia, unspecified: Secondary | ICD-10-CM | POA: Diagnosis not present

## 2016-12-10 DIAGNOSIS — N186 End stage renal disease: Secondary | ICD-10-CM | POA: Diagnosis not present

## 2016-12-10 DIAGNOSIS — E119 Type 2 diabetes mellitus without complications: Secondary | ICD-10-CM | POA: Diagnosis not present

## 2016-12-10 DIAGNOSIS — N2581 Secondary hyperparathyroidism of renal origin: Secondary | ICD-10-CM | POA: Diagnosis not present

## 2016-12-11 ENCOUNTER — Encounter (HOSPITAL_COMMUNITY): Payer: Self-pay | Admitting: Vascular Surgery

## 2016-12-11 ENCOUNTER — Other Ambulatory Visit: Payer: Self-pay | Admitting: *Deleted

## 2016-12-11 ENCOUNTER — Ambulatory Visit (HOSPITAL_COMMUNITY)
Admission: RE | Admit: 2016-12-11 | Discharge: 2016-12-11 | Disposition: A | Discharge status: Home / Self Care | Payer: Medicare Other | Source: Ambulatory Visit | Attending: Vascular Surgery | Admitting: Vascular Surgery

## 2016-12-11 ENCOUNTER — Encounter (HOSPITAL_COMMUNITY)
Admission: RE | Disposition: A | Discharge status: Home / Self Care | Payer: Self-pay | Source: Ambulatory Visit | Attending: Vascular Surgery

## 2016-12-11 DIAGNOSIS — Y832 Surgical operation with anastomosis, bypass or graft as the cause of abnormal reaction of the patient, or of later complication, without mention of misadventure at the time of the procedure: Secondary | ICD-10-CM | POA: Insufficient documentation

## 2016-12-11 DIAGNOSIS — T82898A Other specified complication of vascular prosthetic devices, implants and grafts, initial encounter: Secondary | ICD-10-CM | POA: Insufficient documentation

## 2016-12-11 DIAGNOSIS — Z992 Dependence on renal dialysis: Secondary | ICD-10-CM | POA: Insufficient documentation

## 2016-12-11 DIAGNOSIS — N186 End stage renal disease: Secondary | ICD-10-CM | POA: Insufficient documentation

## 2016-12-11 HISTORY — PX: A/V FISTULAGRAM: CATH118298

## 2016-12-11 LAB — POCT I-STAT, CHEM 8
BUN: 56 mg/dL — ABNORMAL HIGH (ref 6–20)
CREATININE: 9.1 mg/dL — AB (ref 0.61–1.24)
Calcium, Ion: 1 mmol/L — ABNORMAL LOW (ref 1.15–1.40)
Chloride: 94 mmol/L — ABNORMAL LOW (ref 101–111)
Glucose, Bld: 137 mg/dL — ABNORMAL HIGH (ref 65–99)
HEMATOCRIT: 43 % (ref 39.0–52.0)
HEMOGLOBIN: 14.6 g/dL (ref 13.0–17.0)
POTASSIUM: 5.1 mmol/L (ref 3.5–5.1)
SODIUM: 135 mmol/L (ref 135–145)
TCO2: 31 mmol/L (ref 22–32)

## 2016-12-11 LAB — PROTIME-INR
INR: 1.15
PROTHROMBIN TIME: 14.6 s (ref 11.4–15.2)

## 2016-12-11 LAB — GLUCOSE, CAPILLARY: GLUCOSE-CAPILLARY: 148 mg/dL — AB (ref 65–99)

## 2016-12-11 SURGERY — A/V FISTULAGRAM
Anesthesia: LOCAL | Laterality: Right

## 2016-12-11 MED ORDER — FENTANYL CITRATE (PF) 100 MCG/2ML IJ SOLN
INTRAMUSCULAR | Status: DC | PRN
  Administered 2016-12-11: 50 ug via INTRAVENOUS

## 2016-12-11 MED ORDER — LIDOCAINE HCL (PF) 1 % IJ SOLN
INTRAMUSCULAR | Status: AC
  Filled 2016-12-11: qty 30

## 2016-12-11 MED ORDER — SODIUM CHLORIDE 0.9 % IV SOLN: 250.0000 mL | INTRAVENOUS | Status: DC | PRN

## 2016-12-11 MED ORDER — IODIXANOL 320 MG/ML IV SOLN
INTRAVENOUS | Status: DC | PRN
  Administered 2016-12-11: 25 mL via INTRA_ARTERIAL

## 2016-12-11 MED ORDER — MIDAZOLAM HCL 2 MG/2ML IJ SOLN
INTRAMUSCULAR | Status: AC
  Filled 2016-12-11: qty 2

## 2016-12-11 MED ORDER — LIDOCAINE HCL (PF) 1 % IJ SOLN
INTRAMUSCULAR | Status: DC | PRN
  Administered 2016-12-11: 2 mL

## 2016-12-11 MED ORDER — FENTANYL CITRATE (PF) 100 MCG/2ML IJ SOLN
INTRAMUSCULAR | Status: AC
  Filled 2016-12-11: qty 2

## 2016-12-11 MED ORDER — SODIUM CHLORIDE 0.9% FLUSH: 3.0000 mL | Freq: Two times a day (BID) | INTRAVENOUS | Status: DC

## 2016-12-11 MED ORDER — MIDAZOLAM HCL 2 MG/2ML IJ SOLN
INTRAMUSCULAR | Status: DC | PRN
  Administered 2016-12-11: 1 mg via INTRAVENOUS

## 2016-12-11 MED ORDER — HEPARIN (PORCINE) IN NACL 2-0.9 UNIT/ML-% IJ SOLN
INTRAMUSCULAR | Status: AC
  Filled 2016-12-11: qty 1000

## 2016-12-11 MED ORDER — HEPARIN (PORCINE) IN NACL 2-0.9 UNIT/ML-% IJ SOLN
INTRAMUSCULAR | Status: AC | PRN
  Administered 2016-12-11: 500 mL

## 2016-12-11 MED ORDER — SODIUM CHLORIDE 0.9% FLUSH: 3.0000 mL | INTRAVENOUS | Status: DC | PRN

## 2016-12-11 SURGICAL SUPPLY — 10 items
BAG SNAP BAND KOVER 36X36 (MISCELLANEOUS) ×2 IMPLANT
COVER DOME SNAP 22 D (MISCELLANEOUS) ×2 IMPLANT
COVER PRB 48X5XTLSCP FOLD TPE (BAG) ×1 IMPLANT
COVER PROBE 5X48 (BAG) ×4
KIT MICROINTRODUCER STIFF 5F (SHEATH) ×1 IMPLANT
PROTECTION STATION PRESSURIZED (MISCELLANEOUS) ×2
STATION PROTECTION PRESSURIZED (MISCELLANEOUS) ×1 IMPLANT
STOPCOCK MORSE 400PSI 3WAY (MISCELLANEOUS) ×2 IMPLANT
TRAY PV CATH (CUSTOM PROCEDURE TRAY) ×2 IMPLANT
TUBING CIL FLEX 10 FLL-RA (TUBING) ×3 IMPLANT

## 2016-12-11 NOTE — Op Note (Signed)
    Patient name: Kevin James MRN: [id number] DOB: [birth year] Sex: male  12/11/2016 Pre-operative Diagnosis: esrd Post-operative diagnosis:  Same Surgeon:  Erlene Quan C. Donzetta Matters, MD Procedure Performed: 1.  US guided cannulation of right arm av fistula 2.  Right arm fistulogram  Indications: 61 year old male on dialysis via left arm AV fistula that is failing.  He recently had a right arm AV fistula placed this is failed to mature.  He is indicated for angiogram possible intervention.  Findings: The radiocephalic fistula is diminutive with very minimal flow.  This gives way to a basilic vein in the upper arm that drains in the axilla to the deep system that appears to be patent.  The basilic vein was also evaluated with ultrasound seems to be sufficient for left upper arm AV fistula creation.   Procedure:  The patient was identified in the holding area and taken to room 8.  The patient was then placed supine on the table and prepped and draped in the usual sterile fashion.  A time out was called.  Ultrasound was used to evaluate the right arm AV fistula this was cannulated with micropuncture needle followed by wire and sheath.  Right upper extremity fistula gram was performed with the above findings.  This time the sheath was removed and 4-0 Monocryl suture was placed.  I used ultrasound to evaluate the basilic vein in the upper arm which should be sufficient for fistula creation.  Patient tolerated procedure well without immediate complication.   [name] C. Donzetta Matters, MD [facility] [place] Office: [phone] Pager: [phone]

## 2016-12-11 NOTE — H&P (Signed)
   History and Physical Update  The patient was interviewed and re-examined.  The patient's previous History and Physical has been reviewed and is unchanged from Dr. Oneida Alar office visit. Plan for right arm fistulogram with possible intervention.  Brandon C. Donzetta Matters, MD [facility] [place] Office: [phone] Pager: [phone]  12/11/2016, 7:16 AM

## 2016-12-11 NOTE — Discharge Instructions (Signed)

## 2016-12-12 ENCOUNTER — Telehealth: Payer: Self-pay | Admitting: Pharmacist

## 2016-12-12 DIAGNOSIS — N186 End stage renal disease: Secondary | ICD-10-CM | POA: Diagnosis not present

## 2016-12-12 DIAGNOSIS — N2581 Secondary hyperparathyroidism of renal origin: Secondary | ICD-10-CM | POA: Diagnosis not present

## 2016-12-12 DIAGNOSIS — E119 Type 2 diabetes mellitus without complications: Secondary | ICD-10-CM | POA: Diagnosis not present

## 2016-12-12 NOTE — Telephone Encounter (Signed)
-----   Message from Mena Goes, RN sent at 12/11/2016  1:19 PM EST ----- Regarding: holding Coumadin for surgery Patient is to have a right upper arm arteriovenous fistula creation for dialysis on 12-25-16. Dr. Oneida Alar asks that he be off Coumadin x 3 days if possible.

## 2016-12-12 NOTE — Telephone Encounter (Signed)
Pt takes Coumadin for afib with CHADS2VASc score of 2 (HTN, DM). He does also have cholesterol embolization syndrome. Ok to hold warfarin for 3 days as requested and resume after procedure on procedure day in PM. Clearance routed.

## 2016-12-14 DIAGNOSIS — E119 Type 2 diabetes mellitus without complications: Secondary | ICD-10-CM | POA: Diagnosis not present

## 2016-12-14 DIAGNOSIS — N186 End stage renal disease: Secondary | ICD-10-CM | POA: Diagnosis not present

## 2016-12-14 DIAGNOSIS — N2581 Secondary hyperparathyroidism of renal origin: Secondary | ICD-10-CM | POA: Diagnosis not present

## 2016-12-17 DIAGNOSIS — E119 Type 2 diabetes mellitus without complications: Secondary | ICD-10-CM | POA: Diagnosis not present

## 2016-12-17 DIAGNOSIS — N2581 Secondary hyperparathyroidism of renal origin: Secondary | ICD-10-CM | POA: Diagnosis not present

## 2016-12-17 DIAGNOSIS — N186 End stage renal disease: Secondary | ICD-10-CM | POA: Diagnosis not present

## 2016-12-19 DIAGNOSIS — E119 Type 2 diabetes mellitus without complications: Secondary | ICD-10-CM | POA: Diagnosis not present

## 2016-12-19 DIAGNOSIS — N186 End stage renal disease: Secondary | ICD-10-CM | POA: Diagnosis not present

## 2016-12-19 DIAGNOSIS — N2581 Secondary hyperparathyroidism of renal origin: Secondary | ICD-10-CM | POA: Diagnosis not present

## 2016-12-20 ENCOUNTER — Ambulatory Visit (INDEPENDENT_AMBULATORY_CARE_PROVIDER_SITE_OTHER): Payer: Medicare Other | Admitting: *Deleted

## 2016-12-20 DIAGNOSIS — I480 Paroxysmal atrial fibrillation: Secondary | ICD-10-CM | POA: Diagnosis not present

## 2016-12-20 DIAGNOSIS — Z5181 Encounter for therapeutic drug level monitoring: Secondary | ICD-10-CM

## 2016-12-20 LAB — POCT INR: INR: 1.9

## 2016-12-20 NOTE — Patient Instructions (Signed)
Description   Continue taking 1 tablet daily Last day to take coumadin is Dec 14th then no coumadin until after procedures on 12/25/2016 then after procedure resume coumadin evening of procedure at same dose 1 tablet daily unless instructed otherwise by Doctor doing procedure  . Recheck INR in 1 week after procedure . [phone] -coumadin clinic, call with changes in medication or procedures

## 2016-12-21 DIAGNOSIS — I480 Paroxysmal atrial fibrillation: Secondary | ICD-10-CM | POA: Diagnosis not present

## 2016-12-21 DIAGNOSIS — E119 Type 2 diabetes mellitus without complications: Secondary | ICD-10-CM | POA: Diagnosis not present

## 2016-12-21 DIAGNOSIS — N2581 Secondary hyperparathyroidism of renal origin: Secondary | ICD-10-CM | POA: Diagnosis not present

## 2016-12-21 DIAGNOSIS — N186 End stage renal disease: Secondary | ICD-10-CM | POA: Diagnosis not present

## 2016-12-24 ENCOUNTER — Encounter (HOSPITAL_COMMUNITY): Payer: Self-pay | Admitting: *Deleted

## 2016-12-24 ENCOUNTER — Other Ambulatory Visit: Payer: Self-pay

## 2016-12-24 DIAGNOSIS — N2581 Secondary hyperparathyroidism of renal origin: Secondary | ICD-10-CM | POA: Diagnosis not present

## 2016-12-24 DIAGNOSIS — E119 Type 2 diabetes mellitus without complications: Secondary | ICD-10-CM | POA: Diagnosis not present

## 2016-12-24 DIAGNOSIS — N186 End stage renal disease: Secondary | ICD-10-CM | POA: Diagnosis not present

## 2016-12-24 NOTE — Progress Notes (Signed)
Pt SDW Pre-op call completed by pt spouse Estill Bamberg (DPR). Spouse denies that pt C/O any cardiopulmonary issues. Spouse stated that pt is under the care of Dr. Dorris Carnes, Cardiology. Spouse denies that pt had a stress test or cardiac cath. Spouse made aware to have pt stop taking otc vitamins, fish oil, herbal medications and NSAIDs. Spouse stated that pt last dose of Coumadin was " 3 days ago."  Spouse verbalized understanding of all pre-op instructions.

## 2016-12-25 ENCOUNTER — Encounter (HOSPITAL_COMMUNITY)
Admission: RE | Disposition: A | Discharge status: Home / Self Care | Payer: Self-pay | Source: Ambulatory Visit | Attending: Vascular Surgery

## 2016-12-25 ENCOUNTER — Encounter (HOSPITAL_COMMUNITY): Payer: Self-pay

## 2016-12-25 ENCOUNTER — Telehealth: Payer: Self-pay | Admitting: Vascular Surgery

## 2016-12-25 ENCOUNTER — Ambulatory Visit (HOSPITAL_COMMUNITY)
Admission: RE | Admit: 2016-12-25 | Discharge: 2016-12-25 | Disposition: A | Discharge status: Home / Self Care | Payer: Medicare Other | Source: Ambulatory Visit | Attending: Vascular Surgery | Admitting: Vascular Surgery

## 2016-12-25 ENCOUNTER — Ambulatory Visit (HOSPITAL_COMMUNITY): Payer: Medicare Other | Admitting: Anesthesiology

## 2016-12-25 DIAGNOSIS — E1122 Type 2 diabetes mellitus with diabetic chronic kidney disease: Secondary | ICD-10-CM | POA: Insufficient documentation

## 2016-12-25 DIAGNOSIS — Z992 Dependence on renal dialysis: Secondary | ICD-10-CM | POA: Insufficient documentation

## 2016-12-25 DIAGNOSIS — Z87891 Personal history of nicotine dependence: Secondary | ICD-10-CM | POA: Insufficient documentation

## 2016-12-25 DIAGNOSIS — I120 Hypertensive chronic kidney disease with stage 5 chronic kidney disease or end stage renal disease: Secondary | ICD-10-CM | POA: Insufficient documentation

## 2016-12-25 DIAGNOSIS — N186 End stage renal disease: Secondary | ICD-10-CM | POA: Insufficient documentation

## 2016-12-25 DIAGNOSIS — Z6836 Body mass index (BMI) 36.0-36.9, adult: Secondary | ICD-10-CM | POA: Insufficient documentation

## 2016-12-25 DIAGNOSIS — N185 Chronic kidney disease, stage 5: Secondary | ICD-10-CM | POA: Diagnosis not present

## 2016-12-25 DIAGNOSIS — E669 Obesity, unspecified: Secondary | ICD-10-CM | POA: Diagnosis not present

## 2016-12-25 DIAGNOSIS — Z79899 Other long term (current) drug therapy: Secondary | ICD-10-CM | POA: Insufficient documentation

## 2016-12-25 DIAGNOSIS — Z7901 Long term (current) use of anticoagulants: Secondary | ICD-10-CM | POA: Diagnosis not present

## 2016-12-25 DIAGNOSIS — I480 Paroxysmal atrial fibrillation: Secondary | ICD-10-CM | POA: Diagnosis not present

## 2016-12-25 HISTORY — DX: Unspecified hearing loss, unspecified ear: H91.90

## 2016-12-25 HISTORY — PX: AV FISTULA PLACEMENT: SHX1204

## 2016-12-25 LAB — POCT I-STAT 4, (NA,K, GLUC, HGB,HCT)
GLUCOSE: 133 mg/dL — AB (ref 65–99)
HEMATOCRIT: 43 % (ref 39.0–52.0)
Hemoglobin: 14.6 g/dL (ref 13.0–17.0)
Potassium: 5 mmol/L (ref 3.5–5.1)
Sodium: 135 mmol/L (ref 135–145)

## 2016-12-25 LAB — PROTIME-INR
INR: 1.12
Prothrombin Time: 14.3 seconds (ref 11.4–15.2)

## 2016-12-25 LAB — GLUCOSE, CAPILLARY
Glucose-Capillary: 145 mg/dL — ABNORMAL HIGH (ref 65–99)
Glucose-Capillary: 123 mg/dL — ABNORMAL HIGH (ref 65–99)

## 2016-12-25 SURGERY — ARTERIOVENOUS (AV) FISTULA CREATION
Anesthesia: Monitor Anesthesia Care | Site: Arm Upper | Laterality: Right

## 2016-12-25 MED ORDER — METOPROLOL TARTRATE 5 MG/5ML IV SOLN
INTRAVENOUS | Status: DC | PRN
  Administered 2016-12-25 (×2): 2.5 mg via INTRAVENOUS

## 2016-12-25 MED ORDER — OXYCODONE HCL 5 MG/5ML PO SOLN: 5.0000 mg | Freq: Once | ORAL | Status: DC | PRN

## 2016-12-25 MED ORDER — PROPOFOL 10 MG/ML IV BOLUS
INTRAVENOUS | Status: DC | PRN
  Administered 2016-12-25 (×2): 30 mg via INTRAVENOUS

## 2016-12-25 MED ORDER — LIDOCAINE 2% (20 MG/ML) 5 ML SYRINGE
INTRAMUSCULAR | Status: AC
  Filled 2016-12-25: qty 5

## 2016-12-25 MED ORDER — PROPOFOL 10 MG/ML IV BOLUS
INTRAVENOUS | Status: AC
  Filled 2016-12-25: qty 20

## 2016-12-25 MED ORDER — SODIUM CHLORIDE 0.9 % IV SOLN
INTRAVENOUS | Status: DC
  Administered 2016-12-25: 10:00:00 via INTRAVENOUS

## 2016-12-25 MED ORDER — CHLORHEXIDINE GLUCONATE 4 % EX LIQD: 60.0000 mL | Freq: Once | CUTANEOUS | Status: DC

## 2016-12-25 MED ORDER — MIDAZOLAM HCL 5 MG/5ML IJ SOLN
INTRAMUSCULAR | Status: DC | PRN
  Administered 2016-12-25: 1 mg via INTRAVENOUS

## 2016-12-25 MED ORDER — LIDOCAINE HCL (PF) 1 % IJ SOLN
INTRAMUSCULAR | Status: DC | PRN
  Administered 2016-12-25: 40 mL

## 2016-12-25 MED ORDER — LABETALOL HCL 5 MG/ML IV SOLN
INTRAVENOUS | Status: DC | PRN
  Administered 2016-12-25 (×2): 10 mg via INTRAVENOUS

## 2016-12-25 MED ORDER — LIDOCAINE 2% (20 MG/ML) 5 ML SYRINGE
INTRAMUSCULAR | Status: DC | PRN
  Administered 2016-12-25: 100 mg via INTRAVENOUS

## 2016-12-25 MED ORDER — 0.9 % SODIUM CHLORIDE (POUR BTL) OPTIME
TOPICAL | Status: DC | PRN
  Administered 2016-12-25: 1000 mL

## 2016-12-25 MED ORDER — TRAMADOL HCL 50 MG PO TABS: 50.0000 mg | ORAL_TABLET | Freq: Four times a day (QID) | ORAL | 0 refills | Status: DC | PRN

## 2016-12-25 MED ORDER — MIDAZOLAM HCL 2 MG/2ML IJ SOLN
INTRAMUSCULAR | Status: AC
  Filled 2016-12-25: qty 2

## 2016-12-25 MED ORDER — FENTANYL CITRATE (PF) 250 MCG/5ML IJ SOLN
INTRAMUSCULAR | Status: AC
  Filled 2016-12-25: qty 5

## 2016-12-25 MED ORDER — HEPARIN SODIUM (PORCINE) 1000 UNIT/ML IJ SOLN
INTRAMUSCULAR | Status: DC | PRN
  Administered 2016-12-25: 5000 [IU] via INTRAVENOUS

## 2016-12-25 MED ORDER — ONDANSETRON HCL 4 MG/2ML IJ SOLN
INTRAMUSCULAR | Status: AC
  Filled 2016-12-25: qty 2

## 2016-12-25 MED ORDER — ONDANSETRON HCL 4 MG/2ML IJ SOLN: 4.0000 mg | Freq: Once | INTRAMUSCULAR | Status: DC | PRN

## 2016-12-25 MED ORDER — FENTANYL CITRATE (PF) 100 MCG/2ML IJ SOLN
INTRAMUSCULAR | Status: DC | PRN
  Administered 2016-12-25 (×4): 50 ug via INTRAVENOUS

## 2016-12-25 MED ORDER — FENTANYL CITRATE (PF) 100 MCG/2ML IJ SOLN: 25.0000 ug | INTRAMUSCULAR | Status: DC | PRN

## 2016-12-25 MED ORDER — PROPOFOL 500 MG/50ML IV EMUL
INTRAVENOUS | Status: DC | PRN
  Administered 2016-12-25: 25 ug/kg/min via INTRAVENOUS

## 2016-12-25 MED ORDER — LIDOCAINE HCL (PF) 1 % IJ SOLN
INTRAMUSCULAR | Status: AC
  Filled 2016-12-25: qty 30

## 2016-12-25 MED ORDER — OXYCODONE HCL 5 MG PO TABS: 5.0000 mg | ORAL_TABLET | Freq: Once | ORAL | Status: DC | PRN

## 2016-12-25 MED ORDER — DEXTROSE 5 % IV SOLN
INTRAVENOUS | Status: AC
  Filled 2016-12-25: qty 1.5

## 2016-12-25 MED ORDER — ONDANSETRON HCL 4 MG/2ML IJ SOLN
INTRAMUSCULAR | Status: DC | PRN
  Administered 2016-12-25: 4 mg via INTRAVENOUS

## 2016-12-25 MED ORDER — DEXTROSE 5 % IV SOLN
1.5000 g | INTRAVENOUS | Status: AC
  Administered 2016-12-25: 1.5 g via INTRAVENOUS

## 2016-12-25 MED ORDER — SODIUM CHLORIDE 0.9 % IV SOLN
INTRAVENOUS | Status: DC | PRN
  Administered 2016-12-25: 10:00:00

## 2016-12-25 SURGICAL SUPPLY — 47 items
ADH SKN CLS APL DERMABOND .7 (GAUZE/BANDAGES/DRESSINGS) ×1
ADH SKN CLS LQ APL DERMABOND (GAUZE/BANDAGES/DRESSINGS) ×2
ARMBAND PINK RESTRICT EXTREMIT (MISCELLANEOUS) ×4 IMPLANT
BLADE 11 SAFETY STRL DISP (BLADE) ×1 IMPLANT
CANISTER SUCT 3000ML PPV (MISCELLANEOUS) ×2 IMPLANT
CANNULA VESSEL 3MM 2 BLNT TIP (CANNULA) ×2 IMPLANT
CLIP TI WIDE RED SMALL 6 (CLIP) ×1 IMPLANT
CLIP VESOCCLUDE MED 6/CT (CLIP) ×2 IMPLANT
CLIP VESOCCLUDE SM WIDE 6/CT (CLIP) ×2 IMPLANT
COVER PROBE W GEL 5X96 (DRAPES) IMPLANT
DECANTER SPIKE VIAL GLASS SM (MISCELLANEOUS) ×3 IMPLANT
DERMABOND ADHESIVE PROPEN (GAUZE/BANDAGES/DRESSINGS) ×2
DERMABOND ADVANCED (GAUZE/BANDAGES/DRESSINGS) ×1
DERMABOND ADVANCED .7 DNX12 (GAUZE/BANDAGES/DRESSINGS) ×1 IMPLANT
DERMABOND ADVANCED .7 DNX6 (GAUZE/BANDAGES/DRESSINGS) IMPLANT
DRAIN PENROSE 1/4X12 LTX STRL (WOUND CARE) ×2 IMPLANT
ELECT REM PT RETURN 9FT ADLT (ELECTROSURGICAL) ×2
ELECTRODE REM PT RTRN 9FT ADLT (ELECTROSURGICAL) ×1 IMPLANT
GLOVE BIO SURGEON STRL SZ 6.5 (GLOVE) ×3 IMPLANT
GLOVE BIO SURGEON STRL SZ7 (GLOVE) ×1 IMPLANT
GLOVE BIO SURGEON STRL SZ7.5 (GLOVE) ×2 IMPLANT
GLOVE BIOGEL PI IND STRL 6.5 (GLOVE) IMPLANT
GLOVE BIOGEL PI IND STRL 7.0 (GLOVE) IMPLANT
GLOVE BIOGEL PI INDICATOR 6.5 (GLOVE) ×1
GLOVE BIOGEL PI INDICATOR 7.0 (GLOVE) ×1
GOWN STRL REUS W/ TWL LRG LVL3 (GOWN DISPOSABLE) ×3 IMPLANT
GOWN STRL REUS W/TWL LRG LVL3 (GOWN DISPOSABLE) ×6
KIT BASIN OR (CUSTOM PROCEDURE TRAY) ×2 IMPLANT
KIT ROOM TURNOVER OR (KITS) ×2 IMPLANT
LOOP VESSEL MINI RED (MISCELLANEOUS) IMPLANT
NS IRRIG 1000ML POUR BTL (IV SOLUTION) ×2 IMPLANT
PACK CV ACCESS (CUSTOM PROCEDURE TRAY) ×2 IMPLANT
PAD ARMBOARD 7.5X6 YLW CONV (MISCELLANEOUS) ×4 IMPLANT
SPONGE SURGIFOAM ABS GEL 100 (HEMOSTASIS) IMPLANT
SUT PROLENE 6 0 BV (SUTURE) ×2 IMPLANT
SUT PROLENE 7 0 BV 1 (SUTURE) ×4 IMPLANT
SUT SILK 2 0 SH (SUTURE) ×2 IMPLANT
SUT SILK 3 0 (SUTURE) ×2
SUT SILK 3-0 18XBRD TIE 12 (SUTURE) IMPLANT
SUT VIC AB 3-0 SH 27 (SUTURE) ×12
SUT VIC AB 3-0 SH 27X BRD (SUTURE) ×1 IMPLANT
SUT VIC AB 4-0 PS2 27 (SUTURE) ×4 IMPLANT
SUT VICRYL 4-0 PS2 18IN ABS (SUTURE) ×2 IMPLANT
TAPE UMBILICAL COTTON 1/8X30 (MISCELLANEOUS) ×1 IMPLANT
TOWEL GREEN STERILE (TOWEL DISPOSABLE) ×2 IMPLANT
UNDERPAD 30X30 (UNDERPADS AND DIAPERS) ×2 IMPLANT
WATER STERILE IRR 1000ML POUR (IV SOLUTION) ×2 IMPLANT

## 2016-12-25 NOTE — Telephone Encounter (Signed)
-----   Message from Mena Goes, RN sent at 12/25/2016 12:30 PM EST ----- Regarding: 4-6 weeks   ----- Message ----- From: Gabriel Earing, PA-C Sent: 12/25/2016  12:17 PM To: Vvs Charge Pool  S/p right BVT 12/25/16.  F/u with Dr. Oneida Alar in 4-6 weeks.  He does not need a duplex.  Thanks

## 2016-12-25 NOTE — Telephone Encounter (Signed)
Sched appt 01/31/17 at 10:30. Lm on hm#.

## 2016-12-25 NOTE — Anesthesia Preprocedure Evaluation (Signed)
Anesthesia Evaluation  Patient identified by MRN, date of birth, ID band Patient awake    Reviewed: Allergy & Precautions, NPO status , Patient's Chart, lab work & pertinent test results  Airway Mallampati: III  TM Distance: >3 FB     Dental  (+) Teeth Intact, Dental Advisory Given   Pulmonary former smoker,    breath sounds clear to auscultation       Cardiovascular hypertension,  Rhythm:Irregular Rate:Normal     Neuro/Psych    GI/Hepatic   Endo/Other  diabetes  Renal/GU      Musculoskeletal   Abdominal (+) + obese,   Peds  Hematology   Anesthesia Other Findings   Reproductive/Obstetrics                             Anesthesia Physical Anesthesia Plan  ASA: III  Anesthesia Plan: MAC   Post-op Pain Management:    Induction:   PONV Risk Score and Plan: Ondansetron and Propofol infusion  Airway Management Planned: Natural Airway and Simple Face Mask  Additional Equipment:   Intra-op Plan:   Post-operative Plan:   Informed Consent: I have reviewed the patients History and Physical, chart, labs and discussed the procedure including the risks, benefits and alternatives for the proposed anesthesia with the patient or authorized representative who has indicated his/her understanding and acceptance.   Dental advisory given  Plan Discussed with: CRNA and Anesthesiologist  Anesthesia Plan Comments:         Anesthesia Quick Evaluation

## 2016-12-25 NOTE — Anesthesia Postprocedure Evaluation (Signed)
Anesthesia Post Note  Patient: Kevin James  Procedure(s) Performed: ARTERIOVENOUS (AV) FISTULA CREATION (Right Arm Upper)     Patient location during evaluation: PACU Anesthesia Type: MAC Level of consciousness: awake, awake and alert and oriented Pain management: pain level controlled Vital Signs Assessment: post-procedure vital signs reviewed and stable Respiratory status: spontaneous breathing, nonlabored ventilation and respiratory function stable Cardiovascular status: blood pressure returned to baseline Anesthetic complications: no    Last Vitals:  Vitals:   12/25/16 1303 12/25/16 1313  BP: 100/67 120/74  Pulse: (!) 103 (!) 102  Resp: 18 18  Temp: (!) 36.4 C   SpO2: 95% 94%    Last Pain:  Vitals:   12/25/16 1313  TempSrc:   PainSc: 0-No pain                 Breckyn Ticas COKER

## 2016-12-25 NOTE — Interval H&P Note (Signed)
History and Physical Interval Note:  12/25/2016 9:16 AM  Kevin James  has presented today for surgery, with the diagnosis of end stage renal disease  The various methods of treatment have been discussed with the patient and family. After consideration of risks, benefits and other options for treatment, the patient has consented to  Procedure(s): ARTERIOVENOUS (AV) FISTULA CREATION (Right) as a surgical intervention .  The patient's history has been reviewed, patient examined, no change in status, stable for surgery.  I have reviewed the patient's chart and labs.  Questions were answered to the patient's satisfaction.     Ruta Hinds

## 2016-12-25 NOTE — Transfer of Care (Signed)
Immediate Anesthesia Transfer of Care Note  Patient: Kevin James  Procedure(s) Performed: ARTERIOVENOUS (AV) FISTULA CREATION (Right Arm Upper)  Patient Location: PACU  Anesthesia Type:MAC  Level of Consciousness: awake, alert , oriented and patient cooperative  Airway & Oxygen Therapy: Patient Spontanous Breathing and Patient connected to nasal cannula oxygen  Post-op Assessment: Report given to RN, Post -op Vital signs reviewed and stable and Patient moving all extremities  Post vital signs: Reviewed and stable  Last Vitals:  Vitals:   12/25/16 0830 12/25/16 1233  BP: (!) 117/93 (!) 112/46  Pulse: 95 (!) 104  Resp: 20 12  Temp: 36.6 C (!) 36.3 C  SpO2:  97%    Last Pain:  Vitals:   12/25/16 0926  TempSrc:   PainSc: 8          Complications: No apparent anesthesia complications

## 2016-12-25 NOTE — Op Note (Signed)
Procedure: Right basilic vein transposition fistula  Preoperative diagnosis: End-stage renal disease  Postoperative diagnosis: Same  Anesthesia: Local with sedation  Assistant: Leontine Locket PA-C  Operative findings: 3-5 mm left basilic vein, 3 mm left brachial artery  Operative details: After obtaining informed consent, the patient was taken to the operating room. The patient was placed in supine position operating table. After adequate sedation, the patient's entire right upper extremity was prepped and draped in the usual sterile fashion. Next ultrasound was used to identify the right basilic vein.  Local anesthesia was infiltrated over the vein.  A longitudinal incision was made just above the antecubital crease in order to expose the basilic vein. The vein was of good quality approximately 3-5 mm in diameter throughout its course. Several longitudinal skip incisions were made up the arm from just below the antecubital crease all the way up to the axilla to harvest the basilic vein. Care was taken to try to not injure any sensory nerves and all the motor nerves were identified and protected. The vein was dissected free circumferentially and small side branches ligated and divided between silk ties or clips. Next the brachial artery was exposed by deepening the basilic vein harvest incision just above the antecubital crease. The artery was approximately 3 mm in diameter. This was dissected free circumferentially. Vessel loops were placed around it. There was some spasm within the artery after dissecting it free. Next the distal basilic vein was ligated with a 2 0 silk tie and the vein transected. The vein was brought out throughout the skip incisions and gently distended with heparinized saline and marked for orientation. The vein was then tunneled subcutaneously in an arcing configuration out over the biceps muscle down to the level of the exposed brachial artery just above the antecubital crease.  The patient was given 5000 units of intravenous heparin. Vessel loops were used to control the artery proximally and distally. The vein was cut to length and sewn end of vein to side of artery using a running 7-0 Prolene suture. Just prior completion of the anastomosis, it was forebled backbled and thoroughly flushed. The anastomosis was secured Vesseloops were released.  There was a palpable thrill immediately. There was also good Doppler flow throughout the course of the fistula. The distal vein was also noted to fill fully. At this point all subcutaneous tissues were reapproximated using running 3-0 Vicryl suture. All skin incisions were closed with running 4  0 Vicryl subcuticular stitch. Dermabond was applied to all incisions. The patient tolerated the procedure well and there were no complications. Instrument sponge needle counts were correct at the end of the case. The patient was taken to the recovery room in stable condition.  Ruta Hinds, MD [facility] [place] Office: [phone] Pager: [phone]

## 2016-12-25 NOTE — Discharge Instructions (Signed)
[facility]  Discharge Instructions  AV Fistula or Graft Surgery for Dialysis Access  Please refer to the following instructions for your post-procedure care. Your surgeon or physician assistant will discuss any changes with you.  Activity  You may drive the day following your surgery, if you are comfortable and no longer taking prescription pain medication. Resume full activity as the soreness in your incision resolves.  Bathing/Showering  You may shower after you go home. Keep your incision dry for 48 hours. Do not soak in a bathtub, hot tub, or swim until the incision heals completely. You may not shower if you have a hemodialysis catheter.  Incision Care  Clean your incision with mild soap and water after 48 hours. Pat the area dry with a clean towel. You do not need a bandage unless otherwise instructed. Do not apply any ointments or creams to your incision. You may have skin glue on your incision. Do not peel it off. It will come off on its own in about one week. Your arm may swell a bit after surgery. To reduce swelling use pillows to elevate your arm so it is above your heart. Your doctor will tell you if you need to lightly wrap your arm with an ACE bandage.  Diet  Resume your normal diet. There are not special food restrictions following this procedure. In order to heal from your surgery, it is CRITICAL to get adequate nutrition. Your body requires vitamins, minerals, and protein. Vegetables are the best source of vitamins and minerals. Vegetables also provide the perfect balance of protein. Processed food has little nutritional value, so try to avoid this.  Medications  Resume taking all of your medications. If your incision is causing pain, you may take over-the counter pain relievers such as acetaminophen (Tylenol). If you were prescribed a stronger pain medication, please be aware these medications can cause nausea and constipation. Prevent  nausea by taking the medication with a snack or meal. Avoid constipation by drinking plenty of fluids and eating foods with high amount of fiber, such as fruits, vegetables, and grains. Do not take Tylenol if you are taking prescription pain medications.   After procedure resume coumadin evening of procedure at same dose 1 tablet daily unless instructed otherwise by Doctor doing procedure . Recheck INR in 1 week after procedure . [phone] -coumadin clinic, call with changes in medication or procedures.   Follow up Your surgeon may want to see you in the office following your access surgery. If so, this will be arranged at the time of your surgery.  Please call us immediately for any of the following conditions:  Increased pain, redness, drainage (pus) from your incision site Fever of 101 degrees or higher Severe or worsening pain at your incision site Hand pain or numbness.  Reduce your risk of vascular disease:  Stop smoking. If you would like help, call [facility] at 1-800-QUIT-NOW (418)793-2747) or [place] [facility] [place] your cholesterol Maintain a desired weight Control your diabetes Keep your blood pressure down  Dialysis  It will take several weeks to several months for your new dialysis access to be ready for use. Your surgeon will determine when it is OK to use it. Your nephrologist will continue to direct your dialysis. You can continue to use your Permcath until your new access is ready for use.   12/25/2016 Kevin James [id number] May 17, 1955  Surgeon(s): Fields, Jessy Oto, MD  Procedure(s): Right Basilic Vein Transposition  x Do not stick fistula for 12 weeks    If you have any questions, please call the office at [phone].

## 2016-12-26 ENCOUNTER — Encounter (HOSPITAL_COMMUNITY): Payer: Self-pay | Admitting: Vascular Surgery

## 2016-12-26 DIAGNOSIS — N186 End stage renal disease: Secondary | ICD-10-CM | POA: Diagnosis not present

## 2016-12-26 DIAGNOSIS — N2581 Secondary hyperparathyroidism of renal origin: Secondary | ICD-10-CM | POA: Diagnosis not present

## 2016-12-26 DIAGNOSIS — E119 Type 2 diabetes mellitus without complications: Secondary | ICD-10-CM | POA: Diagnosis not present

## 2016-12-28 DIAGNOSIS — N2581 Secondary hyperparathyroidism of renal origin: Secondary | ICD-10-CM | POA: Diagnosis not present

## 2016-12-28 DIAGNOSIS — E119 Type 2 diabetes mellitus without complications: Secondary | ICD-10-CM | POA: Diagnosis not present

## 2016-12-28 DIAGNOSIS — N186 End stage renal disease: Secondary | ICD-10-CM | POA: Diagnosis not present

## 2016-12-30 DIAGNOSIS — E119 Type 2 diabetes mellitus without complications: Secondary | ICD-10-CM | POA: Diagnosis not present

## 2016-12-30 DIAGNOSIS — N2581 Secondary hyperparathyroidism of renal origin: Secondary | ICD-10-CM | POA: Diagnosis not present

## 2016-12-30 DIAGNOSIS — N186 End stage renal disease: Secondary | ICD-10-CM | POA: Diagnosis not present

## 2017-01-02 DIAGNOSIS — E119 Type 2 diabetes mellitus without complications: Secondary | ICD-10-CM | POA: Diagnosis not present

## 2017-01-02 DIAGNOSIS — N186 End stage renal disease: Secondary | ICD-10-CM | POA: Diagnosis not present

## 2017-01-02 DIAGNOSIS — I480 Paroxysmal atrial fibrillation: Secondary | ICD-10-CM | POA: Diagnosis not present

## 2017-01-02 DIAGNOSIS — N2581 Secondary hyperparathyroidism of renal origin: Secondary | ICD-10-CM | POA: Diagnosis not present

## 2017-01-03 ENCOUNTER — Ambulatory Visit (INDEPENDENT_AMBULATORY_CARE_PROVIDER_SITE_OTHER): Payer: Medicare Other | Admitting: Pharmacist

## 2017-01-03 DIAGNOSIS — I480 Paroxysmal atrial fibrillation: Secondary | ICD-10-CM | POA: Diagnosis not present

## 2017-01-03 DIAGNOSIS — Z5181 Encounter for therapeutic drug level monitoring: Secondary | ICD-10-CM

## 2017-01-03 LAB — POCT INR: INR: 1.8

## 2017-01-03 NOTE — Patient Instructions (Signed)
Description   Take 1.5 tablets today then continue taking 1 tablet daily. Recheck INR in 2 weeks . [phone] -coumadin clinic, call with changes in medication or procedures

## 2017-01-04 DIAGNOSIS — N2581 Secondary hyperparathyroidism of renal origin: Secondary | ICD-10-CM | POA: Diagnosis not present

## 2017-01-04 DIAGNOSIS — E119 Type 2 diabetes mellitus without complications: Secondary | ICD-10-CM | POA: Diagnosis not present

## 2017-01-04 DIAGNOSIS — N186 End stage renal disease: Secondary | ICD-10-CM | POA: Diagnosis not present

## 2017-01-06 DIAGNOSIS — N2581 Secondary hyperparathyroidism of renal origin: Secondary | ICD-10-CM | POA: Diagnosis not present

## 2017-01-06 DIAGNOSIS — N186 End stage renal disease: Secondary | ICD-10-CM | POA: Diagnosis not present

## 2017-01-06 DIAGNOSIS — E119 Type 2 diabetes mellitus without complications: Secondary | ICD-10-CM | POA: Diagnosis not present

## 2017-01-07 DIAGNOSIS — E1129 Type 2 diabetes mellitus with other diabetic kidney complication: Secondary | ICD-10-CM | POA: Diagnosis not present

## 2017-01-07 DIAGNOSIS — N186 End stage renal disease: Secondary | ICD-10-CM | POA: Diagnosis not present

## 2017-01-07 DIAGNOSIS — Z992 Dependence on renal dialysis: Secondary | ICD-10-CM | POA: Diagnosis not present

## 2017-01-09 DIAGNOSIS — D509 Iron deficiency anemia, unspecified: Secondary | ICD-10-CM | POA: Diagnosis not present

## 2017-01-09 DIAGNOSIS — N186 End stage renal disease: Secondary | ICD-10-CM | POA: Diagnosis not present

## 2017-01-09 DIAGNOSIS — E119 Type 2 diabetes mellitus without complications: Secondary | ICD-10-CM | POA: Diagnosis not present

## 2017-01-09 DIAGNOSIS — N2581 Secondary hyperparathyroidism of renal origin: Secondary | ICD-10-CM | POA: Diagnosis not present

## 2017-01-09 DIAGNOSIS — E875 Hyperkalemia: Secondary | ICD-10-CM | POA: Diagnosis not present

## 2017-01-11 DIAGNOSIS — D509 Iron deficiency anemia, unspecified: Secondary | ICD-10-CM | POA: Diagnosis not present

## 2017-01-11 DIAGNOSIS — I480 Paroxysmal atrial fibrillation: Secondary | ICD-10-CM | POA: Diagnosis not present

## 2017-01-11 DIAGNOSIS — E875 Hyperkalemia: Secondary | ICD-10-CM | POA: Diagnosis not present

## 2017-01-11 DIAGNOSIS — N2581 Secondary hyperparathyroidism of renal origin: Secondary | ICD-10-CM | POA: Diagnosis not present

## 2017-01-11 DIAGNOSIS — E119 Type 2 diabetes mellitus without complications: Secondary | ICD-10-CM | POA: Diagnosis not present

## 2017-01-11 DIAGNOSIS — N186 End stage renal disease: Secondary | ICD-10-CM | POA: Diagnosis not present

## 2017-01-14 DIAGNOSIS — N186 End stage renal disease: Secondary | ICD-10-CM | POA: Diagnosis not present

## 2017-01-14 DIAGNOSIS — E119 Type 2 diabetes mellitus without complications: Secondary | ICD-10-CM | POA: Diagnosis not present

## 2017-01-14 DIAGNOSIS — D509 Iron deficiency anemia, unspecified: Secondary | ICD-10-CM | POA: Diagnosis not present

## 2017-01-14 DIAGNOSIS — E875 Hyperkalemia: Secondary | ICD-10-CM | POA: Diagnosis not present

## 2017-01-14 DIAGNOSIS — N2581 Secondary hyperparathyroidism of renal origin: Secondary | ICD-10-CM | POA: Diagnosis not present

## 2017-01-16 DIAGNOSIS — E119 Type 2 diabetes mellitus without complications: Secondary | ICD-10-CM | POA: Diagnosis not present

## 2017-01-16 DIAGNOSIS — E875 Hyperkalemia: Secondary | ICD-10-CM | POA: Diagnosis not present

## 2017-01-16 DIAGNOSIS — D509 Iron deficiency anemia, unspecified: Secondary | ICD-10-CM | POA: Diagnosis not present

## 2017-01-16 DIAGNOSIS — N186 End stage renal disease: Secondary | ICD-10-CM | POA: Diagnosis not present

## 2017-01-16 DIAGNOSIS — N2581 Secondary hyperparathyroidism of renal origin: Secondary | ICD-10-CM | POA: Diagnosis not present

## 2017-01-17 ENCOUNTER — Ambulatory Visit (INDEPENDENT_AMBULATORY_CARE_PROVIDER_SITE_OTHER): Payer: Medicare Other

## 2017-01-17 DIAGNOSIS — Z5181 Encounter for therapeutic drug level monitoring: Secondary | ICD-10-CM

## 2017-01-17 DIAGNOSIS — I480 Paroxysmal atrial fibrillation: Secondary | ICD-10-CM

## 2017-01-17 LAB — POCT INR: INR: 1.8

## 2017-01-17 NOTE — Patient Instructions (Signed)
Description   Start taking 1 tablet daily except 1.5 tablets on Thursdays. Recheck INR in 2 weeks. [phone] -coumadin clinic, call with changes in medication or procedures

## 2017-01-18 DIAGNOSIS — E875 Hyperkalemia: Secondary | ICD-10-CM | POA: Diagnosis not present

## 2017-01-18 DIAGNOSIS — E119 Type 2 diabetes mellitus without complications: Secondary | ICD-10-CM | POA: Diagnosis not present

## 2017-01-18 DIAGNOSIS — N2581 Secondary hyperparathyroidism of renal origin: Secondary | ICD-10-CM | POA: Diagnosis not present

## 2017-01-18 DIAGNOSIS — D509 Iron deficiency anemia, unspecified: Secondary | ICD-10-CM | POA: Diagnosis not present

## 2017-01-18 DIAGNOSIS — N186 End stage renal disease: Secondary | ICD-10-CM | POA: Diagnosis not present

## 2017-01-21 DIAGNOSIS — E119 Type 2 diabetes mellitus without complications: Secondary | ICD-10-CM | POA: Diagnosis not present

## 2017-01-21 DIAGNOSIS — E875 Hyperkalemia: Secondary | ICD-10-CM | POA: Diagnosis not present

## 2017-01-21 DIAGNOSIS — D509 Iron deficiency anemia, unspecified: Secondary | ICD-10-CM | POA: Diagnosis not present

## 2017-01-21 DIAGNOSIS — N186 End stage renal disease: Secondary | ICD-10-CM | POA: Diagnosis not present

## 2017-01-21 DIAGNOSIS — N2581 Secondary hyperparathyroidism of renal origin: Secondary | ICD-10-CM | POA: Diagnosis not present

## 2017-01-23 DIAGNOSIS — E875 Hyperkalemia: Secondary | ICD-10-CM | POA: Diagnosis not present

## 2017-01-23 DIAGNOSIS — D509 Iron deficiency anemia, unspecified: Secondary | ICD-10-CM | POA: Diagnosis not present

## 2017-01-23 DIAGNOSIS — N186 End stage renal disease: Secondary | ICD-10-CM | POA: Diagnosis not present

## 2017-01-23 DIAGNOSIS — E119 Type 2 diabetes mellitus without complications: Secondary | ICD-10-CM | POA: Diagnosis not present

## 2017-01-23 DIAGNOSIS — N2581 Secondary hyperparathyroidism of renal origin: Secondary | ICD-10-CM | POA: Diagnosis not present

## 2017-01-25 DIAGNOSIS — N186 End stage renal disease: Secondary | ICD-10-CM | POA: Diagnosis not present

## 2017-01-25 DIAGNOSIS — N2581 Secondary hyperparathyroidism of renal origin: Secondary | ICD-10-CM | POA: Diagnosis not present

## 2017-01-25 DIAGNOSIS — D509 Iron deficiency anemia, unspecified: Secondary | ICD-10-CM | POA: Diagnosis not present

## 2017-01-25 DIAGNOSIS — E119 Type 2 diabetes mellitus without complications: Secondary | ICD-10-CM | POA: Diagnosis not present

## 2017-01-25 DIAGNOSIS — E875 Hyperkalemia: Secondary | ICD-10-CM | POA: Diagnosis not present

## 2017-01-28 DIAGNOSIS — E119 Type 2 diabetes mellitus without complications: Secondary | ICD-10-CM | POA: Diagnosis not present

## 2017-01-28 DIAGNOSIS — D509 Iron deficiency anemia, unspecified: Secondary | ICD-10-CM | POA: Diagnosis not present

## 2017-01-28 DIAGNOSIS — E875 Hyperkalemia: Secondary | ICD-10-CM | POA: Diagnosis not present

## 2017-01-28 DIAGNOSIS — N186 End stage renal disease: Secondary | ICD-10-CM | POA: Diagnosis not present

## 2017-01-28 DIAGNOSIS — N2581 Secondary hyperparathyroidism of renal origin: Secondary | ICD-10-CM | POA: Diagnosis not present

## 2017-01-30 DIAGNOSIS — N186 End stage renal disease: Secondary | ICD-10-CM | POA: Diagnosis not present

## 2017-01-30 DIAGNOSIS — E875 Hyperkalemia: Secondary | ICD-10-CM | POA: Diagnosis not present

## 2017-01-30 DIAGNOSIS — E119 Type 2 diabetes mellitus without complications: Secondary | ICD-10-CM | POA: Diagnosis not present

## 2017-01-30 DIAGNOSIS — D509 Iron deficiency anemia, unspecified: Secondary | ICD-10-CM | POA: Diagnosis not present

## 2017-01-30 DIAGNOSIS — N2581 Secondary hyperparathyroidism of renal origin: Secondary | ICD-10-CM | POA: Diagnosis not present

## 2017-01-31 ENCOUNTER — Encounter: Payer: Self-pay | Admitting: *Deleted

## 2017-01-31 ENCOUNTER — Encounter: Payer: Self-pay | Admitting: Vascular Surgery

## 2017-01-31 ENCOUNTER — Other Ambulatory Visit: Payer: Self-pay | Admitting: *Deleted

## 2017-01-31 ENCOUNTER — Ambulatory Visit (INDEPENDENT_AMBULATORY_CARE_PROVIDER_SITE_OTHER): Payer: Medicare Other | Admitting: *Deleted

## 2017-01-31 ENCOUNTER — Ambulatory Visit (INDEPENDENT_AMBULATORY_CARE_PROVIDER_SITE_OTHER): Payer: Medicare Other | Admitting: Vascular Surgery

## 2017-01-31 VITALS — BP 181/110 | HR 68 | Temp 97.0°F | Resp 16 | Ht 73.0 in | Wt 272.0 lb

## 2017-01-31 DIAGNOSIS — I480 Paroxysmal atrial fibrillation: Secondary | ICD-10-CM | POA: Diagnosis not present

## 2017-01-31 DIAGNOSIS — N186 End stage renal disease: Secondary | ICD-10-CM

## 2017-01-31 DIAGNOSIS — Z5181 Encounter for therapeutic drug level monitoring: Secondary | ICD-10-CM

## 2017-01-31 DIAGNOSIS — Z992 Dependence on renal dialysis: Secondary | ICD-10-CM

## 2017-01-31 LAB — POCT INR: INR: 3.4

## 2017-01-31 NOTE — Progress Notes (Signed)
Vitals:   01/31/17 1057  BP: (!) 178/103  Pulse: 70  Resp: 16  Temp: (!) 97 F (36.1 C)  TempSrc: Oral  SpO2: 96%  Weight: 272 lb (123.4 kg)  Height: 6\' 1"  (1.854 m)

## 2017-01-31 NOTE — H&P (View-Only) (Signed)
Patient is a 62 year old male who returns for follow-up today.  He underwent right basilic vein transposition fistula on December 25, 2016.  He reports no numbness or tingling in his hand with the exception of some mild numbness in the right fifth digit.  His new fistula was placed due to the fact that a left upper arm fistula is currently failing.  He reports today that his nephrologist has requested whether or not we might be able to do another intervention on his left upper extremity so that will remain patent and useful until the right arm fistula is ready to use.  Currently dialyzes on Monday Wednesday Friday.  He is on Coumadin for atrial fibrillation.  Physical exam:  Vitals:   01/31/17 1057 01/31/17 1104  BP: (!) 178/103 (!) 181/110  Pulse: 70 68  Resp: 16   Temp: (!) 97 F (36.1 C)   TempSrc: Oral   SpO2: 96%   Weight: 272 lb (123.4 kg)   Height: 6\' 1"  (1.854 m)     Right upper extremity: Easily palpable thrill well-healed incisions fistula is visible on the skin surface  Left upper extremity: Audible bruit in left upper arm AV fistula  Assessment: #1 maturing right basilic vein transposition fistula.  This should be ready for use by mid March.  2.  Poorly functioning left basilic vein transposition fistula with multiple prior percutaneous interventions.  We will attempt to improve the flow in this fistula with a fistulogram by Dr. Trula Slade on February 05, 2017.  Hopefully we can maintain patency of his left arm fistula until the right arm fistula is ready.  Risk benefits possible complications of procedure details of fistulogram and intervention were discussed with the patient and his wife today.  We will stop his Coumadin 3 days prior to the procedure.  Ruta Hinds, MD [facility] [place] Office: [phone] Pager: [phone]

## 2017-01-31 NOTE — Progress Notes (Signed)
Patient is a 62 year old male who returns for follow-up today.  He underwent right basilic vein transposition fistula on December 25, 2016.  He reports no numbness or tingling in his hand with the exception of some mild numbness in the right fifth digit.  His new fistula was placed due to the fact that a left upper arm fistula is currently failing.  He reports today that his nephrologist has requested whether or not we might be able to do another intervention on his left upper extremity so that will remain patent and useful until the right arm fistula is ready to use.  Currently dialyzes on Monday Wednesday Friday.  He is on Coumadin for atrial fibrillation.  Physical exam:  Vitals:   01/31/17 1057 01/31/17 1104  BP: (!) 178/103 (!) 181/110  Pulse: 70 68  Resp: 16   Temp: (!) 97 F (36.1 C)   TempSrc: Oral   SpO2: 96%   Weight: 272 lb (123.4 kg)   Height: 6\' 1"  (1.854 m)     Right upper extremity: Easily palpable thrill well-healed incisions fistula is visible on the skin surface  Left upper extremity: Audible bruit in left upper arm AV fistula  Assessment: #1 maturing right basilic vein transposition fistula.  This should be ready for use by mid March.  2.  Poorly functioning left basilic vein transposition fistula with multiple prior percutaneous interventions.  We will attempt to improve the flow in this fistula with a fistulogram by Dr. Trula Slade on February 05, 2017.  Hopefully we can maintain patency of his left arm fistula until the right arm fistula is ready.  Risk benefits possible complications of procedure details of fistulogram and intervention were discussed with the patient and his wife today.  We will stop his Coumadin 3 days prior to the procedure.  Ruta Hinds, MD [facility] [place] Office: [phone] Pager: [phone]

## 2017-01-31 NOTE — Patient Instructions (Signed)
Description   Do not take coumadin today Jan 24th then continue  taking 1 tablet daily except 1.5 tablets on Thursdays. Recheck INR in 2 weeks. [phone] -coumadin clinic, call with changes in medication or procedures

## 2017-02-01 DIAGNOSIS — N186 End stage renal disease: Secondary | ICD-10-CM | POA: Diagnosis not present

## 2017-02-01 DIAGNOSIS — D509 Iron deficiency anemia, unspecified: Secondary | ICD-10-CM | POA: Diagnosis not present

## 2017-02-01 DIAGNOSIS — E119 Type 2 diabetes mellitus without complications: Secondary | ICD-10-CM | POA: Diagnosis not present

## 2017-02-01 DIAGNOSIS — E875 Hyperkalemia: Secondary | ICD-10-CM | POA: Diagnosis not present

## 2017-02-01 DIAGNOSIS — N2581 Secondary hyperparathyroidism of renal origin: Secondary | ICD-10-CM | POA: Diagnosis not present

## 2017-02-03 DIAGNOSIS — L02412 Cutaneous abscess of left axilla: Secondary | ICD-10-CM | POA: Diagnosis not present

## 2017-02-03 DIAGNOSIS — L02422 Furuncle of left axilla: Secondary | ICD-10-CM | POA: Diagnosis not present

## 2017-02-04 ENCOUNTER — Other Ambulatory Visit: Payer: Self-pay | Admitting: Neurology

## 2017-02-04 DIAGNOSIS — G4761 Periodic limb movement disorder: Secondary | ICD-10-CM

## 2017-02-04 DIAGNOSIS — Z9989 Dependence on other enabling machines and devices: Principal | ICD-10-CM

## 2017-02-04 DIAGNOSIS — I482 Chronic atrial fibrillation, unspecified: Secondary | ICD-10-CM

## 2017-02-04 DIAGNOSIS — G4733 Obstructive sleep apnea (adult) (pediatric): Secondary | ICD-10-CM

## 2017-02-04 DIAGNOSIS — E875 Hyperkalemia: Secondary | ICD-10-CM | POA: Diagnosis not present

## 2017-02-04 DIAGNOSIS — N2581 Secondary hyperparathyroidism of renal origin: Secondary | ICD-10-CM | POA: Diagnosis not present

## 2017-02-04 DIAGNOSIS — G2581 Restless legs syndrome: Secondary | ICD-10-CM

## 2017-02-04 DIAGNOSIS — D509 Iron deficiency anemia, unspecified: Secondary | ICD-10-CM | POA: Diagnosis not present

## 2017-02-04 DIAGNOSIS — G629 Polyneuropathy, unspecified: Secondary | ICD-10-CM

## 2017-02-04 DIAGNOSIS — N186 End stage renal disease: Secondary | ICD-10-CM | POA: Diagnosis not present

## 2017-02-04 DIAGNOSIS — E119 Type 2 diabetes mellitus without complications: Secondary | ICD-10-CM | POA: Diagnosis not present

## 2017-02-04 NOTE — Addendum Note (Signed)
Addended by: Lianne Cure A on: 02/04/2017 02:52 PM   Modules accepted: Orders

## 2017-02-05 ENCOUNTER — Encounter (HOSPITAL_COMMUNITY): Payer: Self-pay | Admitting: Surgery

## 2017-02-05 ENCOUNTER — Encounter (HOSPITAL_COMMUNITY)
Admission: RE | Disposition: A | Discharge status: Home / Self Care | Payer: Self-pay | Source: Ambulatory Visit | Attending: Surgery

## 2017-02-05 ENCOUNTER — Ambulatory Visit (HOSPITAL_COMMUNITY)
Admission: RE | Admit: 2017-02-05 | Discharge: 2017-02-05 | Disposition: A | Discharge status: Home / Self Care | Payer: Medicare Other | Source: Ambulatory Visit | Attending: Surgery | Admitting: Surgery

## 2017-02-05 DIAGNOSIS — I4891 Unspecified atrial fibrillation: Secondary | ICD-10-CM | POA: Diagnosis not present

## 2017-02-05 DIAGNOSIS — Y832 Surgical operation with anastomosis, bypass or graft as the cause of abnormal reaction of the patient, or of later complication, without mention of misadventure at the time of the procedure: Secondary | ICD-10-CM | POA: Insufficient documentation

## 2017-02-05 DIAGNOSIS — T82510A Breakdown (mechanical) of surgically created arteriovenous fistula, initial encounter: Secondary | ICD-10-CM | POA: Insufficient documentation

## 2017-02-05 DIAGNOSIS — T82898A Other specified complication of vascular prosthetic devices, implants and grafts, initial encounter: Secondary | ICD-10-CM | POA: Diagnosis not present

## 2017-02-05 DIAGNOSIS — Z7901 Long term (current) use of anticoagulants: Secondary | ICD-10-CM | POA: Insufficient documentation

## 2017-02-05 HISTORY — PX: A/V FISTULAGRAM: CATH118298

## 2017-02-05 HISTORY — PX: PERIPHERAL VASCULAR BALLOON ANGIOPLASTY: CATH118281

## 2017-02-05 LAB — BASIC METABOLIC PANEL
Anion gap: 22 — ABNORMAL HIGH (ref 5–15)
BUN: 140 mg/dL — ABNORMAL HIGH (ref 6–20)
CHLORIDE: 99 mmol/L — AB (ref 101–111)
CO2: 16 mmol/L — AB (ref 22–32)
Calcium: 8.7 mg/dL — ABNORMAL LOW (ref 8.9–10.3)
Creatinine, Ser: 14.64 mg/dL — ABNORMAL HIGH (ref 0.61–1.24)
GFR calc Af Amer: 4 mL/min — ABNORMAL LOW (ref >60)
GFR calc non Af Amer: 3 mL/min — ABNORMAL LOW (ref >60)
GLUCOSE: 109 mg/dL — AB (ref 65–99)
POTASSIUM: 6.2 mmol/L — AB (ref 3.5–5.1)
Sodium: 137 mmol/L (ref 135–145)

## 2017-02-05 LAB — POCT I-STAT, CHEM 8
BUN: 135 mg/dL — AB (ref 6–20)
CHLORIDE: 103 mmol/L (ref 101–111)
CREATININE: 14.9 mg/dL — AB (ref 0.61–1.24)
Calcium, Ion: 1 mmol/L — ABNORMAL LOW (ref 1.15–1.40)
GLUCOSE: 118 mg/dL — AB (ref 65–99)
HCT: 35 % — ABNORMAL LOW (ref 39.0–52.0)
Hemoglobin: 11.9 g/dL — ABNORMAL LOW (ref 13.0–17.0)
Potassium: 5.9 mmol/L — ABNORMAL HIGH (ref 3.5–5.1)
SODIUM: 135 mmol/L (ref 135–145)
TCO2: 21 mmol/L — ABNORMAL LOW (ref 22–32)

## 2017-02-05 LAB — PROTIME-INR
INR: 1.25
Prothrombin Time: 15.6 seconds — ABNORMAL HIGH (ref 11.4–15.2)

## 2017-02-05 SURGERY — A/V FISTULAGRAM
Anesthesia: LOCAL | Laterality: Left

## 2017-02-05 MED ORDER — HEPARIN SODIUM (PORCINE) 1000 UNIT/ML IJ SOLN
INTRAMUSCULAR | Status: AC
  Filled 2017-02-05: qty 1

## 2017-02-05 MED ORDER — SODIUM CHLORIDE 0.9 % IV SOLN: 250.0000 mL | INTRAVENOUS | Status: DC | PRN

## 2017-02-05 MED ORDER — LIDOCAINE HCL (PF) 1 % IJ SOLN
INTRAMUSCULAR | Status: DC | PRN
  Administered 2017-02-05: 5 mL

## 2017-02-05 MED ORDER — HYDRALAZINE HCL 20 MG/ML IJ SOLN: 5.0000 mg | INTRAMUSCULAR | Status: DC | PRN

## 2017-02-05 MED ORDER — SODIUM CHLORIDE 0.9% FLUSH: 3.0000 mL | INTRAVENOUS | Status: DC | PRN

## 2017-02-05 MED ORDER — HEPARIN (PORCINE) IN NACL 2-0.9 UNIT/ML-% IJ SOLN
INTRAMUSCULAR | Status: AC
  Filled 2017-02-05: qty 500

## 2017-02-05 MED ORDER — SODIUM CHLORIDE 0.9% FLUSH: 3.0000 mL | Freq: Two times a day (BID) | INTRAVENOUS | Status: DC

## 2017-02-05 MED ORDER — MIDAZOLAM HCL 2 MG/2ML IJ SOLN
INTRAMUSCULAR | Status: DC | PRN
  Administered 2017-02-05: 1 mg via INTRAVENOUS

## 2017-02-05 MED ORDER — IODIXANOL 320 MG/ML IV SOLN
INTRAVENOUS | Status: DC | PRN
  Administered 2017-02-05: 60 mL via INTRAVENOUS

## 2017-02-05 MED ORDER — LABETALOL HCL 5 MG/ML IV SOLN: 10.0000 mg | INTRAVENOUS | Status: DC | PRN

## 2017-02-05 MED ORDER — FENTANYL CITRATE (PF) 100 MCG/2ML IJ SOLN
INTRAMUSCULAR | Status: DC | PRN
  Administered 2017-02-05: 25 ug via INTRAVENOUS

## 2017-02-05 MED ORDER — LIDOCAINE HCL 1 % IJ SOLN
INTRAMUSCULAR | Status: AC
  Filled 2017-02-05: qty 20

## 2017-02-05 MED ORDER — MIDAZOLAM HCL 2 MG/2ML IJ SOLN
INTRAMUSCULAR | Status: AC
  Filled 2017-02-05: qty 2

## 2017-02-05 MED ORDER — FENTANYL CITRATE (PF) 100 MCG/2ML IJ SOLN
INTRAMUSCULAR | Status: AC
  Filled 2017-02-05: qty 2

## 2017-02-05 MED ORDER — HEPARIN SODIUM (PORCINE) 1000 UNIT/ML IJ SOLN
INTRAMUSCULAR | Status: DC | PRN
  Administered 2017-02-05: 3000 [IU] via INTRAVENOUS

## 2017-02-05 MED ORDER — HEPARIN (PORCINE) IN NACL 2-0.9 UNIT/ML-% IJ SOLN
INTRAMUSCULAR | Status: AC | PRN
  Administered 2017-02-05: 500 mL

## 2017-02-05 SURGICAL SUPPLY — 15 items
BAG SNAP BAND KOVER 36X36 (MISCELLANEOUS) ×2 IMPLANT
BALLN MUSTANG 6X80X75 (BALLOONS) ×2
BALLOON MUSTANG 6X80X75 (BALLOONS) IMPLANT
COVER DOME SNAP 22 D (MISCELLANEOUS) ×2 IMPLANT
COVER PRB 48X5XTLSCP FOLD TPE (BAG) ×1 IMPLANT
COVER PROBE 5X48 (BAG) ×2
KIT ENCORE 26 ADVANTAGE (KITS) ×1 IMPLANT
KIT MICROINTRODUCER STIFF 5F (SHEATH) ×1 IMPLANT
PROTECTION STATION PRESSURIZED (MISCELLANEOUS) ×2
SHEATH PINNACLE R/O II 5F 6CM (SHEATH) ×1 IMPLANT
STATION PROTECTION PRESSURIZED (MISCELLANEOUS) ×1 IMPLANT
STOPCOCK MORSE 400PSI 3WAY (MISCELLANEOUS) ×2 IMPLANT
TRAY PV CATH (CUSTOM PROCEDURE TRAY) ×2 IMPLANT
TUBING CIL FLEX 10 FLL-RA (TUBING) ×2 IMPLANT
WIRE BENTSON .035X145CM (WIRE) ×1 IMPLANT

## 2017-02-05 NOTE — Discharge Instructions (Signed)

## 2017-02-05 NOTE — Interval H&P Note (Signed)
History and Physical Interval Note:  02/05/2017 8:35 AM  [employer]  has presented today for surgery, with the diagnosis of esrd  The various methods of treatment have been discussed with the patient and family. After consideration of risks, benefits and other options for treatment, the patient has consented to  Procedure(s): A/V FISTULAGRAM (Left) as a surgical intervention .  The patient's history has been reviewed, patient examined, no change in status, stable for surgery.  I have reviewed the patient's chart and labs.  Questions were answered to the patient's satisfaction.     Kevin James

## 2017-02-05 NOTE — Op Note (Signed)
    Patient name: Kevin James MRN: [id number] DOB: Mar 16, 1955 Sex: male  02/05/2017 Pre-operative Diagnosis: Poorly functioning left basilic vein fistula Post-operative diagnosis:  Same Surgeon:  Annamarie Major Procedure Performed:  1.  Ultrasound-guided access, left basilic vein  2.  Fistulogram  3.  Angioplasty, left basilic vein (6 peripheral vein)  4.  Conscious sedation (28 minutes)   Indications: The patient has a failing left cephalic vein fistula.  He has already undergone access on the right side.  He is here today to try to salvage the use of the left arm until the right side is ready.  Procedure:  The patient was identified in the holding area and taken to room 8.  The patient was then placed supine on the table and prepped and draped in the usual sterile fashion.  A time out was called.  Conscious sedation was administered with the use of IV fentanyl and Versed under continuous physician and nurse monitoring.  Heart rate, blood pressure, and oxygen saturations were continuously monitored.  Ultrasound was used to evaluate the fistula.  The vein was patent and compressible.  A digital ultrasound image was acquired.  The fistula was then accessed under ultrasound guidance using a micropuncture needle.  An 018 wire was then asvanced without resistance and a micropuncture sheath was placed.  Contrast injections were then performed through the sheath.  Findings: No central venous stenosis.  Sclerotic appearing basilic vein particularly near the axilla, approximately 80%.  The arterial venous anastomosis was difficult to evaluate.   Intervention: After the above images were acquired the decision was made to proceed with intervention.  Over a 035 wire, a 5 French sheath was placed.  3000 units of heparin was given.  I then selected a 6 x 80 Mustang balloon and performed balloon angioplasty throughout the entire length of the basilic vein.  Follow-up imaging revealed residual stenosis of  approximately 15-20%.  Because of the discomfort the patient had with angioplasty I was concerned that if I increase the size of the balloon that he would be at risk for rupture.  I feel like the stenosis was significantly improved and therefore elected to terminate the procedure.  The patient did develop a small hematoma and therefore manual pressure was held for hemostasis.  Impression:  #1  Sclerotic appearing basilic vein fistula which was able to be treated with a 6 mm angioplasty balloon with residual stenosis of approximately 15%  #2  Fistula remains amenable to percutaneous intervention.   Theotis Burrow, M.D. [facility] [place] Office: [phone] Pager:  [phone]

## 2017-02-05 NOTE — Progress Notes (Addendum)
K+ 5.9 on I-stat. Rennis Harding RN notified and she will report to Dr Trula Slade.  Pt states they were not able to complete his dialysis treatment yesterday

## 2017-02-05 NOTE — Progress Notes (Signed)
Notified Dr Trula Slade that patient has a new maturing fistula in right arm and a poor functioning fistula in left arm.  Asked where we should place IV, he states either arm below the fistula.

## 2017-02-06 DIAGNOSIS — E875 Hyperkalemia: Secondary | ICD-10-CM | POA: Diagnosis not present

## 2017-02-06 DIAGNOSIS — N2581 Secondary hyperparathyroidism of renal origin: Secondary | ICD-10-CM | POA: Diagnosis not present

## 2017-02-06 DIAGNOSIS — D509 Iron deficiency anemia, unspecified: Secondary | ICD-10-CM | POA: Diagnosis not present

## 2017-02-06 DIAGNOSIS — N186 End stage renal disease: Secondary | ICD-10-CM | POA: Diagnosis not present

## 2017-02-06 DIAGNOSIS — E119 Type 2 diabetes mellitus without complications: Secondary | ICD-10-CM | POA: Diagnosis not present

## 2017-02-06 MED FILL — Lidocaine HCl Local Inj 1%: INTRAMUSCULAR | Qty: 20 | Status: AC

## 2017-02-07 DIAGNOSIS — E1129 Type 2 diabetes mellitus with other diabetic kidney complication: Secondary | ICD-10-CM | POA: Diagnosis not present

## 2017-02-07 DIAGNOSIS — N186 End stage renal disease: Secondary | ICD-10-CM | POA: Diagnosis not present

## 2017-02-07 DIAGNOSIS — Z992 Dependence on renal dialysis: Secondary | ICD-10-CM | POA: Diagnosis not present

## 2017-02-08 DIAGNOSIS — E1129 Type 2 diabetes mellitus with other diabetic kidney complication: Secondary | ICD-10-CM | POA: Diagnosis not present

## 2017-02-08 DIAGNOSIS — E119 Type 2 diabetes mellitus without complications: Secondary | ICD-10-CM | POA: Diagnosis not present

## 2017-02-08 DIAGNOSIS — N2581 Secondary hyperparathyroidism of renal origin: Secondary | ICD-10-CM | POA: Diagnosis not present

## 2017-02-08 DIAGNOSIS — Z992 Dependence on renal dialysis: Secondary | ICD-10-CM | POA: Diagnosis not present

## 2017-02-08 DIAGNOSIS — N186 End stage renal disease: Secondary | ICD-10-CM | POA: Diagnosis not present

## 2017-02-08 DIAGNOSIS — E875 Hyperkalemia: Secondary | ICD-10-CM | POA: Diagnosis not present

## 2017-02-08 DIAGNOSIS — I480 Paroxysmal atrial fibrillation: Secondary | ICD-10-CM | POA: Diagnosis not present

## 2017-02-11 DIAGNOSIS — N2581 Secondary hyperparathyroidism of renal origin: Secondary | ICD-10-CM | POA: Diagnosis not present

## 2017-02-11 DIAGNOSIS — E119 Type 2 diabetes mellitus without complications: Secondary | ICD-10-CM | POA: Diagnosis not present

## 2017-02-11 DIAGNOSIS — N186 End stage renal disease: Secondary | ICD-10-CM | POA: Diagnosis not present

## 2017-02-11 DIAGNOSIS — E875 Hyperkalemia: Secondary | ICD-10-CM | POA: Diagnosis not present

## 2017-02-13 DIAGNOSIS — E119 Type 2 diabetes mellitus without complications: Secondary | ICD-10-CM | POA: Diagnosis not present

## 2017-02-13 DIAGNOSIS — N186 End stage renal disease: Secondary | ICD-10-CM | POA: Diagnosis not present

## 2017-02-13 DIAGNOSIS — N2581 Secondary hyperparathyroidism of renal origin: Secondary | ICD-10-CM | POA: Diagnosis not present

## 2017-02-13 DIAGNOSIS — E875 Hyperkalemia: Secondary | ICD-10-CM | POA: Diagnosis not present

## 2017-02-14 ENCOUNTER — Ambulatory Visit (INDEPENDENT_AMBULATORY_CARE_PROVIDER_SITE_OTHER): Payer: Medicare Other | Admitting: *Deleted

## 2017-02-14 DIAGNOSIS — Z5181 Encounter for therapeutic drug level monitoring: Secondary | ICD-10-CM

## 2017-02-14 DIAGNOSIS — I480 Paroxysmal atrial fibrillation: Secondary | ICD-10-CM | POA: Diagnosis not present

## 2017-02-14 LAB — POCT INR: INR: 1.6

## 2017-02-14 NOTE — Patient Instructions (Signed)
Description   Today take 2 tablets then continue taking 1 tablet daily except 1.5 tablets on Thursdays. Recheck INR in 2 weeks. [phone] -coumadin clinic, call with changes in medication or procedures

## 2017-02-15 DIAGNOSIS — N186 End stage renal disease: Secondary | ICD-10-CM | POA: Diagnosis not present

## 2017-02-15 DIAGNOSIS — I480 Paroxysmal atrial fibrillation: Secondary | ICD-10-CM | POA: Diagnosis not present

## 2017-02-15 DIAGNOSIS — E119 Type 2 diabetes mellitus without complications: Secondary | ICD-10-CM | POA: Diagnosis not present

## 2017-02-15 DIAGNOSIS — N2581 Secondary hyperparathyroidism of renal origin: Secondary | ICD-10-CM | POA: Diagnosis not present

## 2017-02-15 DIAGNOSIS — E875 Hyperkalemia: Secondary | ICD-10-CM | POA: Diagnosis not present

## 2017-02-18 DIAGNOSIS — E119 Type 2 diabetes mellitus without complications: Secondary | ICD-10-CM | POA: Diagnosis not present

## 2017-02-18 DIAGNOSIS — N186 End stage renal disease: Secondary | ICD-10-CM | POA: Diagnosis not present

## 2017-02-18 DIAGNOSIS — N2581 Secondary hyperparathyroidism of renal origin: Secondary | ICD-10-CM | POA: Diagnosis not present

## 2017-02-18 DIAGNOSIS — E875 Hyperkalemia: Secondary | ICD-10-CM | POA: Diagnosis not present

## 2017-02-20 DIAGNOSIS — E119 Type 2 diabetes mellitus without complications: Secondary | ICD-10-CM | POA: Diagnosis not present

## 2017-02-20 DIAGNOSIS — E875 Hyperkalemia: Secondary | ICD-10-CM | POA: Diagnosis not present

## 2017-02-20 DIAGNOSIS — N2581 Secondary hyperparathyroidism of renal origin: Secondary | ICD-10-CM | POA: Diagnosis not present

## 2017-02-20 DIAGNOSIS — N186 End stage renal disease: Secondary | ICD-10-CM | POA: Diagnosis not present

## 2017-02-22 DIAGNOSIS — E119 Type 2 diabetes mellitus without complications: Secondary | ICD-10-CM | POA: Diagnosis not present

## 2017-02-22 DIAGNOSIS — N186 End stage renal disease: Secondary | ICD-10-CM | POA: Diagnosis not present

## 2017-02-22 DIAGNOSIS — N2581 Secondary hyperparathyroidism of renal origin: Secondary | ICD-10-CM | POA: Diagnosis not present

## 2017-02-22 DIAGNOSIS — E875 Hyperkalemia: Secondary | ICD-10-CM | POA: Diagnosis not present

## 2017-02-25 DIAGNOSIS — N186 End stage renal disease: Secondary | ICD-10-CM | POA: Diagnosis not present

## 2017-02-25 DIAGNOSIS — N2581 Secondary hyperparathyroidism of renal origin: Secondary | ICD-10-CM | POA: Diagnosis not present

## 2017-02-25 DIAGNOSIS — E875 Hyperkalemia: Secondary | ICD-10-CM | POA: Diagnosis not present

## 2017-02-25 DIAGNOSIS — E119 Type 2 diabetes mellitus without complications: Secondary | ICD-10-CM | POA: Diagnosis not present

## 2017-02-27 DIAGNOSIS — N2581 Secondary hyperparathyroidism of renal origin: Secondary | ICD-10-CM | POA: Diagnosis not present

## 2017-02-27 DIAGNOSIS — E119 Type 2 diabetes mellitus without complications: Secondary | ICD-10-CM | POA: Diagnosis not present

## 2017-02-27 DIAGNOSIS — I480 Paroxysmal atrial fibrillation: Secondary | ICD-10-CM | POA: Diagnosis not present

## 2017-02-27 DIAGNOSIS — E875 Hyperkalemia: Secondary | ICD-10-CM | POA: Diagnosis not present

## 2017-02-27 DIAGNOSIS — N186 End stage renal disease: Secondary | ICD-10-CM | POA: Diagnosis not present

## 2017-02-28 ENCOUNTER — Ambulatory Visit (INDEPENDENT_AMBULATORY_CARE_PROVIDER_SITE_OTHER): Payer: Medicare Other | Admitting: *Deleted

## 2017-02-28 DIAGNOSIS — I480 Paroxysmal atrial fibrillation: Secondary | ICD-10-CM

## 2017-02-28 DIAGNOSIS — Z5181 Encounter for therapeutic drug level monitoring: Secondary | ICD-10-CM | POA: Diagnosis not present

## 2017-02-28 LAB — POCT INR: INR: 2.4

## 2017-02-28 NOTE — Patient Instructions (Signed)
Description   Continue taking 1 tablet daily except 1.5 tablets on Thursdays. Recheck INR in 3 weeks. [phone] -coumadin clinic, call with changes in medication or procedures

## 2017-03-01 DIAGNOSIS — E119 Type 2 diabetes mellitus without complications: Secondary | ICD-10-CM | POA: Diagnosis not present

## 2017-03-01 DIAGNOSIS — E875 Hyperkalemia: Secondary | ICD-10-CM | POA: Diagnosis not present

## 2017-03-01 DIAGNOSIS — N2581 Secondary hyperparathyroidism of renal origin: Secondary | ICD-10-CM | POA: Diagnosis not present

## 2017-03-01 DIAGNOSIS — N186 End stage renal disease: Secondary | ICD-10-CM | POA: Diagnosis not present

## 2017-03-04 DIAGNOSIS — N186 End stage renal disease: Secondary | ICD-10-CM | POA: Diagnosis not present

## 2017-03-04 DIAGNOSIS — E875 Hyperkalemia: Secondary | ICD-10-CM | POA: Diagnosis not present

## 2017-03-04 DIAGNOSIS — N2581 Secondary hyperparathyroidism of renal origin: Secondary | ICD-10-CM | POA: Diagnosis not present

## 2017-03-04 DIAGNOSIS — E119 Type 2 diabetes mellitus without complications: Secondary | ICD-10-CM | POA: Diagnosis not present

## 2017-03-05 ENCOUNTER — Ambulatory Visit (HOSPITAL_COMMUNITY)
Admission: RE | Admit: 2017-03-05 | Discharge: 2017-03-05 | Disposition: A | Discharge status: Home / Self Care | Payer: Medicare Other | Source: Ambulatory Visit | Attending: Vascular Surgery | Admitting: Vascular Surgery

## 2017-03-05 DIAGNOSIS — N186 End stage renal disease: Secondary | ICD-10-CM | POA: Insufficient documentation

## 2017-03-05 DIAGNOSIS — Z992 Dependence on renal dialysis: Secondary | ICD-10-CM | POA: Diagnosis not present

## 2017-03-06 DIAGNOSIS — N2581 Secondary hyperparathyroidism of renal origin: Secondary | ICD-10-CM | POA: Diagnosis not present

## 2017-03-06 DIAGNOSIS — I480 Paroxysmal atrial fibrillation: Secondary | ICD-10-CM | POA: Diagnosis not present

## 2017-03-06 DIAGNOSIS — E875 Hyperkalemia: Secondary | ICD-10-CM | POA: Diagnosis not present

## 2017-03-06 DIAGNOSIS — E119 Type 2 diabetes mellitus without complications: Secondary | ICD-10-CM | POA: Diagnosis not present

## 2017-03-06 DIAGNOSIS — N186 End stage renal disease: Secondary | ICD-10-CM | POA: Diagnosis not present

## 2017-03-07 ENCOUNTER — Ambulatory Visit (INDEPENDENT_AMBULATORY_CARE_PROVIDER_SITE_OTHER): Payer: Medicare Other | Admitting: Vascular Surgery

## 2017-03-07 ENCOUNTER — Encounter: Payer: Self-pay | Admitting: Vascular Surgery

## 2017-03-07 ENCOUNTER — Other Ambulatory Visit: Payer: Self-pay

## 2017-03-07 VITALS — BP 149/108 | HR 194 | Temp 98.1°F | Resp 20 | Ht 73.0 in | Wt 269.0 lb

## 2017-03-07 DIAGNOSIS — Z992 Dependence on renal dialysis: Secondary | ICD-10-CM

## 2017-03-07 DIAGNOSIS — N186 End stage renal disease: Secondary | ICD-10-CM

## 2017-03-07 NOTE — Progress Notes (Signed)
Vitals:   03/07/17 1431  BP: (!) 150/111  Pulse: (!) 102  Resp: 20  Temp: 98.1 F (36.7 C)  SpO2: 95%  Weight: 269 lb (122 kg)  Height: 6\' 1"  (1.854 m)

## 2017-03-07 NOTE — Progress Notes (Signed)
Patient is a 62 year old male who returns for follow-up today.  He underwent placement of a right basilic vein transposition fistula December 2018.  He recently had angioplasty of his left arm AV fistula that we are trying to keep functioning until his right arm is ready.  He reports occasional numbness and tingling in his right hand but this is not debilitating to him.  He has been exercising the fistula.  Physical exam  Vitals:   03/07/17 1431 03/07/17 1436  BP: (!) 150/111 (!) 149/108  Pulse: (!) 102 (!) 194  Resp: 20   Temp: 98.1 F (36.7 C)   SpO2: 95%   Weight: 269 lb (122 kg)   Height: 6\' 1"  (1.854 m)     Left upper extremity: Audible bruit in fistula no palpable thrill  Right upper extremity: Palpable thrill fistula is easily palpable in the mid upper arm.  Data: Patient had duplex ultrasound of the AV fistula a few days ago.  This showed a 7 mm diameter less than 2 mm from the skin  Assessment: Maturing AV fistula right arm.  This should be ready for use in about 3 weeks.  Hopefully we will have successful cannulation of this in a band in the left arm AV fistula.  He probably has some mild steal symptoms and will need to keep an eye on this in the long-term.  He will call us if this gets worse.  Plan: Follow-up as needed.  Start cannulation right arm in 3 weeks.  Ruta Hinds, MD [facility] [place] Office: [phone] Pager: [phone]

## 2017-03-08 DIAGNOSIS — E1129 Type 2 diabetes mellitus with other diabetic kidney complication: Secondary | ICD-10-CM | POA: Diagnosis not present

## 2017-03-08 DIAGNOSIS — N2581 Secondary hyperparathyroidism of renal origin: Secondary | ICD-10-CM | POA: Diagnosis not present

## 2017-03-08 DIAGNOSIS — Z992 Dependence on renal dialysis: Secondary | ICD-10-CM | POA: Diagnosis not present

## 2017-03-08 DIAGNOSIS — N186 End stage renal disease: Secondary | ICD-10-CM | POA: Diagnosis not present

## 2017-03-11 DIAGNOSIS — N186 End stage renal disease: Secondary | ICD-10-CM | POA: Diagnosis not present

## 2017-03-11 DIAGNOSIS — N2581 Secondary hyperparathyroidism of renal origin: Secondary | ICD-10-CM | POA: Diagnosis not present

## 2017-03-13 DIAGNOSIS — N2581 Secondary hyperparathyroidism of renal origin: Secondary | ICD-10-CM | POA: Diagnosis not present

## 2017-03-13 DIAGNOSIS — N186 End stage renal disease: Secondary | ICD-10-CM | POA: Diagnosis not present

## 2017-03-15 DIAGNOSIS — N186 End stage renal disease: Secondary | ICD-10-CM | POA: Diagnosis not present

## 2017-03-15 DIAGNOSIS — N2581 Secondary hyperparathyroidism of renal origin: Secondary | ICD-10-CM | POA: Diagnosis not present

## 2017-03-18 ENCOUNTER — Encounter: Payer: Self-pay | Admitting: Adult Health

## 2017-03-18 DIAGNOSIS — N186 End stage renal disease: Secondary | ICD-10-CM | POA: Diagnosis not present

## 2017-03-18 DIAGNOSIS — N2581 Secondary hyperparathyroidism of renal origin: Secondary | ICD-10-CM | POA: Diagnosis not present

## 2017-03-19 ENCOUNTER — Encounter: Payer: Self-pay | Admitting: Adult Health

## 2017-03-19 ENCOUNTER — Ambulatory Visit (INDEPENDENT_AMBULATORY_CARE_PROVIDER_SITE_OTHER): Payer: Medicare Other | Admitting: Adult Health

## 2017-03-19 VITALS — BP 153/105 | HR 128 | Wt 274.0 lb

## 2017-03-19 DIAGNOSIS — G4733 Obstructive sleep apnea (adult) (pediatric): Secondary | ICD-10-CM | POA: Diagnosis not present

## 2017-03-19 DIAGNOSIS — Z9989 Dependence on other enabling machines and devices: Secondary | ICD-10-CM
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
## 2017-03-19 NOTE — Patient Instructions (Signed)
Your Plan:  Continue using CPAP nightly New supplies Change pressure to autoset- 5-15 cm h20 Decrease humidification  Please call [employer] at [phone], and press option 1 when prompted. Their customer service representatives will be glad to assist you. If they are unable to answer, please leave a message and they will call you back. Make sure to leave your name and return phone number.   Thank you for coming to see [place] at [facility]. I hope we have been able to provide you high quality care today.  You may receive a patient satisfaction survey over the next few weeks. We would appreciate your feedback and comments so that we may continue to improve ourselves and the health of our patients.

## 2017-03-19 NOTE — Progress Notes (Signed)
DME CPAP orders for new supplies successfully faxed to [facility].

## 2017-03-20 DIAGNOSIS — N2581 Secondary hyperparathyroidism of renal origin: Secondary | ICD-10-CM | POA: Diagnosis not present

## 2017-03-20 DIAGNOSIS — N186 End stage renal disease: Secondary | ICD-10-CM | POA: Diagnosis not present

## 2017-03-21 ENCOUNTER — Ambulatory Visit (INDEPENDENT_AMBULATORY_CARE_PROVIDER_SITE_OTHER): Payer: Medicare Other | Admitting: *Deleted

## 2017-03-21 DIAGNOSIS — I480 Paroxysmal atrial fibrillation: Secondary | ICD-10-CM | POA: Diagnosis not present

## 2017-03-21 DIAGNOSIS — Z5181 Encounter for therapeutic drug level monitoring: Secondary | ICD-10-CM

## 2017-03-21 LAB — POCT INR: INR: 2.5

## 2017-03-21 NOTE — Patient Instructions (Signed)
Description   Continue taking 1 tablet daily except 1.5 tablets on Thursdays. Recheck INR in 4 weeks. [phone] -coumadin clinic, call with changes in medication or procedures

## 2017-03-22 DIAGNOSIS — N2581 Secondary hyperparathyroidism of renal origin: Secondary | ICD-10-CM | POA: Diagnosis not present

## 2017-03-22 DIAGNOSIS — N186 End stage renal disease: Secondary | ICD-10-CM | POA: Diagnosis not present

## 2017-03-25 DIAGNOSIS — N186 End stage renal disease: Secondary | ICD-10-CM | POA: Diagnosis not present

## 2017-03-25 DIAGNOSIS — N2581 Secondary hyperparathyroidism of renal origin: Secondary | ICD-10-CM | POA: Diagnosis not present

## 2017-03-27 DIAGNOSIS — I480 Paroxysmal atrial fibrillation: Secondary | ICD-10-CM | POA: Diagnosis not present

## 2017-03-27 DIAGNOSIS — N186 End stage renal disease: Secondary | ICD-10-CM | POA: Diagnosis not present

## 2017-03-27 DIAGNOSIS — N2581 Secondary hyperparathyroidism of renal origin: Secondary | ICD-10-CM | POA: Diagnosis not present

## 2017-03-29 DIAGNOSIS — N2581 Secondary hyperparathyroidism of renal origin: Secondary | ICD-10-CM | POA: Diagnosis not present

## 2017-03-29 DIAGNOSIS — N186 End stage renal disease: Secondary | ICD-10-CM | POA: Diagnosis not present

## 2017-04-01 DIAGNOSIS — N2581 Secondary hyperparathyroidism of renal origin: Secondary | ICD-10-CM | POA: Diagnosis not present

## 2017-04-01 DIAGNOSIS — N186 End stage renal disease: Secondary | ICD-10-CM | POA: Diagnosis not present

## 2017-04-03 DIAGNOSIS — I480 Paroxysmal atrial fibrillation: Secondary | ICD-10-CM | POA: Diagnosis not present

## 2017-04-03 DIAGNOSIS — N2581 Secondary hyperparathyroidism of renal origin: Secondary | ICD-10-CM | POA: Diagnosis not present

## 2017-04-03 DIAGNOSIS — N186 End stage renal disease: Secondary | ICD-10-CM | POA: Diagnosis not present

## 2017-04-05 DIAGNOSIS — N186 End stage renal disease: Secondary | ICD-10-CM | POA: Diagnosis not present

## 2017-04-05 DIAGNOSIS — N2581 Secondary hyperparathyroidism of renal origin: Secondary | ICD-10-CM | POA: Diagnosis not present

## 2017-04-08 DIAGNOSIS — E119 Type 2 diabetes mellitus without complications: Secondary | ICD-10-CM | POA: Diagnosis not present

## 2017-04-08 DIAGNOSIS — E1129 Type 2 diabetes mellitus with other diabetic kidney complication: Secondary | ICD-10-CM | POA: Diagnosis not present

## 2017-04-08 DIAGNOSIS — Z992 Dependence on renal dialysis: Secondary | ICD-10-CM | POA: Diagnosis not present

## 2017-04-08 DIAGNOSIS — N186 End stage renal disease: Secondary | ICD-10-CM | POA: Diagnosis not present

## 2017-04-08 DIAGNOSIS — N2581 Secondary hyperparathyroidism of renal origin: Secondary | ICD-10-CM | POA: Diagnosis not present

## 2017-04-10 DIAGNOSIS — N186 End stage renal disease: Secondary | ICD-10-CM | POA: Diagnosis not present

## 2017-04-10 DIAGNOSIS — N2581 Secondary hyperparathyroidism of renal origin: Secondary | ICD-10-CM | POA: Diagnosis not present

## 2017-04-10 DIAGNOSIS — E119 Type 2 diabetes mellitus without complications: Secondary | ICD-10-CM | POA: Diagnosis not present

## 2017-04-12 DIAGNOSIS — N2581 Secondary hyperparathyroidism of renal origin: Secondary | ICD-10-CM | POA: Diagnosis not present

## 2017-04-12 DIAGNOSIS — E119 Type 2 diabetes mellitus without complications: Secondary | ICD-10-CM | POA: Diagnosis not present

## 2017-04-12 DIAGNOSIS — N186 End stage renal disease: Secondary | ICD-10-CM | POA: Diagnosis not present

## 2017-04-15 DIAGNOSIS — N2581 Secondary hyperparathyroidism of renal origin: Secondary | ICD-10-CM | POA: Diagnosis not present

## 2017-04-15 DIAGNOSIS — E119 Type 2 diabetes mellitus without complications: Secondary | ICD-10-CM | POA: Diagnosis not present

## 2017-04-15 DIAGNOSIS — N186 End stage renal disease: Secondary | ICD-10-CM | POA: Diagnosis not present

## 2017-04-16 DIAGNOSIS — E1151 Type 2 diabetes mellitus with diabetic peripheral angiopathy without gangrene: Secondary | ICD-10-CM | POA: Diagnosis not present

## 2017-04-16 DIAGNOSIS — I10 Essential (primary) hypertension: Secondary | ICD-10-CM | POA: Diagnosis not present

## 2017-04-16 DIAGNOSIS — N186 End stage renal disease: Secondary | ICD-10-CM | POA: Diagnosis not present

## 2017-04-16 DIAGNOSIS — M792 Neuralgia and neuritis, unspecified: Secondary | ICD-10-CM | POA: Diagnosis not present

## 2017-04-16 DIAGNOSIS — G4733 Obstructive sleep apnea (adult) (pediatric): Secondary | ICD-10-CM | POA: Diagnosis not present

## 2017-04-16 DIAGNOSIS — Z6837 Body mass index (BMI) 37.0-37.9, adult: Secondary | ICD-10-CM | POA: Diagnosis not present

## 2017-04-16 DIAGNOSIS — E1129 Type 2 diabetes mellitus with other diabetic kidney complication: Secondary | ICD-10-CM | POA: Diagnosis not present

## 2017-04-16 DIAGNOSIS — M1000 Idiopathic gout, unspecified site: Secondary | ICD-10-CM | POA: Diagnosis not present

## 2017-04-16 DIAGNOSIS — I4891 Unspecified atrial fibrillation: Secondary | ICD-10-CM | POA: Diagnosis not present

## 2017-04-17 DIAGNOSIS — N186 End stage renal disease: Secondary | ICD-10-CM | POA: Diagnosis not present

## 2017-04-17 DIAGNOSIS — E119 Type 2 diabetes mellitus without complications: Secondary | ICD-10-CM | POA: Diagnosis not present

## 2017-04-17 DIAGNOSIS — N2581 Secondary hyperparathyroidism of renal origin: Secondary | ICD-10-CM | POA: Diagnosis not present

## 2017-04-18 ENCOUNTER — Ambulatory Visit (INDEPENDENT_AMBULATORY_CARE_PROVIDER_SITE_OTHER): Payer: Medicare Other | Admitting: *Deleted

## 2017-04-18 DIAGNOSIS — Z5181 Encounter for therapeutic drug level monitoring: Secondary | ICD-10-CM | POA: Diagnosis not present

## 2017-04-18 DIAGNOSIS — I480 Paroxysmal atrial fibrillation: Secondary | ICD-10-CM

## 2017-04-18 LAB — POCT INR: INR: 2.4

## 2017-04-18 NOTE — Patient Instructions (Signed)
Description   Continue taking 1 tablet daily except 1.5 tablets on Thursdays. Recheck INR in 6 weeks. [phone] -coumadin clinic, call with changes in medication or procedures     

## 2017-04-19 DIAGNOSIS — N2581 Secondary hyperparathyroidism of renal origin: Secondary | ICD-10-CM | POA: Diagnosis not present

## 2017-04-19 DIAGNOSIS — N186 End stage renal disease: Secondary | ICD-10-CM | POA: Diagnosis not present

## 2017-04-19 DIAGNOSIS — E119 Type 2 diabetes mellitus without complications: Secondary | ICD-10-CM | POA: Diagnosis not present

## 2017-04-22 DIAGNOSIS — N2581 Secondary hyperparathyroidism of renal origin: Secondary | ICD-10-CM | POA: Diagnosis not present

## 2017-04-22 DIAGNOSIS — N186 End stage renal disease: Secondary | ICD-10-CM | POA: Diagnosis not present

## 2017-04-22 DIAGNOSIS — E119 Type 2 diabetes mellitus without complications: Secondary | ICD-10-CM | POA: Diagnosis not present

## 2017-04-24 DIAGNOSIS — E119 Type 2 diabetes mellitus without complications: Secondary | ICD-10-CM | POA: Diagnosis not present

## 2017-04-24 DIAGNOSIS — N186 End stage renal disease: Secondary | ICD-10-CM | POA: Diagnosis not present

## 2017-04-24 DIAGNOSIS — N2581 Secondary hyperparathyroidism of renal origin: Secondary | ICD-10-CM | POA: Diagnosis not present

## 2017-04-26 DIAGNOSIS — N186 End stage renal disease: Secondary | ICD-10-CM | POA: Diagnosis not present

## 2017-04-26 DIAGNOSIS — N2581 Secondary hyperparathyroidism of renal origin: Secondary | ICD-10-CM | POA: Diagnosis not present

## 2017-04-26 DIAGNOSIS — E119 Type 2 diabetes mellitus without complications: Secondary | ICD-10-CM | POA: Diagnosis not present

## 2017-04-29 DIAGNOSIS — N186 End stage renal disease: Secondary | ICD-10-CM | POA: Diagnosis not present

## 2017-04-29 DIAGNOSIS — E119 Type 2 diabetes mellitus without complications: Secondary | ICD-10-CM | POA: Diagnosis not present

## 2017-04-29 DIAGNOSIS — N2581 Secondary hyperparathyroidism of renal origin: Secondary | ICD-10-CM | POA: Diagnosis not present

## 2017-05-01 DIAGNOSIS — N186 End stage renal disease: Secondary | ICD-10-CM | POA: Diagnosis not present

## 2017-05-01 DIAGNOSIS — E119 Type 2 diabetes mellitus without complications: Secondary | ICD-10-CM | POA: Diagnosis not present

## 2017-05-01 DIAGNOSIS — N2581 Secondary hyperparathyroidism of renal origin: Secondary | ICD-10-CM | POA: Diagnosis not present

## 2017-05-01 DIAGNOSIS — I480 Paroxysmal atrial fibrillation: Secondary | ICD-10-CM | POA: Diagnosis not present

## 2017-05-02 ENCOUNTER — Ambulatory Visit: Payer: Medicare Other | Admitting: Vascular Surgery

## 2017-05-02 ENCOUNTER — Encounter (HOSPITAL_COMMUNITY): Payer: Medicare Other

## 2017-05-03 DIAGNOSIS — N186 End stage renal disease: Secondary | ICD-10-CM | POA: Diagnosis not present

## 2017-05-03 DIAGNOSIS — E119 Type 2 diabetes mellitus without complications: Secondary | ICD-10-CM | POA: Diagnosis not present

## 2017-05-03 DIAGNOSIS — N2581 Secondary hyperparathyroidism of renal origin: Secondary | ICD-10-CM | POA: Diagnosis not present

## 2017-05-06 DIAGNOSIS — N2581 Secondary hyperparathyroidism of renal origin: Secondary | ICD-10-CM | POA: Diagnosis not present

## 2017-05-06 DIAGNOSIS — E119 Type 2 diabetes mellitus without complications: Secondary | ICD-10-CM | POA: Diagnosis not present

## 2017-05-06 DIAGNOSIS — N186 End stage renal disease: Secondary | ICD-10-CM | POA: Diagnosis not present

## 2017-05-08 DIAGNOSIS — I480 Paroxysmal atrial fibrillation: Secondary | ICD-10-CM | POA: Diagnosis not present

## 2017-05-08 DIAGNOSIS — E1129 Type 2 diabetes mellitus with other diabetic kidney complication: Secondary | ICD-10-CM | POA: Diagnosis not present

## 2017-05-08 DIAGNOSIS — Z992 Dependence on renal dialysis: Secondary | ICD-10-CM | POA: Diagnosis not present

## 2017-05-08 DIAGNOSIS — N186 End stage renal disease: Secondary | ICD-10-CM | POA: Diagnosis not present

## 2017-05-08 DIAGNOSIS — N2581 Secondary hyperparathyroidism of renal origin: Secondary | ICD-10-CM | POA: Diagnosis not present

## 2017-05-08 DIAGNOSIS — E119 Type 2 diabetes mellitus without complications: Secondary | ICD-10-CM | POA: Diagnosis not present

## 2017-05-10 DIAGNOSIS — N186 End stage renal disease: Secondary | ICD-10-CM | POA: Diagnosis not present

## 2017-05-10 DIAGNOSIS — E119 Type 2 diabetes mellitus without complications: Secondary | ICD-10-CM | POA: Diagnosis not present

## 2017-05-10 DIAGNOSIS — N2581 Secondary hyperparathyroidism of renal origin: Secondary | ICD-10-CM | POA: Diagnosis not present

## 2017-05-13 DIAGNOSIS — N2581 Secondary hyperparathyroidism of renal origin: Secondary | ICD-10-CM | POA: Diagnosis not present

## 2017-05-13 DIAGNOSIS — N186 End stage renal disease: Secondary | ICD-10-CM | POA: Diagnosis not present

## 2017-05-13 DIAGNOSIS — E119 Type 2 diabetes mellitus without complications: Secondary | ICD-10-CM | POA: Diagnosis not present

## 2017-05-15 DIAGNOSIS — N2581 Secondary hyperparathyroidism of renal origin: Secondary | ICD-10-CM | POA: Diagnosis not present

## 2017-05-15 DIAGNOSIS — E119 Type 2 diabetes mellitus without complications: Secondary | ICD-10-CM | POA: Diagnosis not present

## 2017-05-15 DIAGNOSIS — N186 End stage renal disease: Secondary | ICD-10-CM | POA: Diagnosis not present

## 2017-05-17 DIAGNOSIS — E119 Type 2 diabetes mellitus without complications: Secondary | ICD-10-CM | POA: Diagnosis not present

## 2017-05-17 DIAGNOSIS — N186 End stage renal disease: Secondary | ICD-10-CM | POA: Diagnosis not present

## 2017-05-17 DIAGNOSIS — N2581 Secondary hyperparathyroidism of renal origin: Secondary | ICD-10-CM | POA: Diagnosis not present

## 2017-05-20 DIAGNOSIS — N186 End stage renal disease: Secondary | ICD-10-CM | POA: Diagnosis not present

## 2017-05-20 DIAGNOSIS — N2581 Secondary hyperparathyroidism of renal origin: Secondary | ICD-10-CM | POA: Diagnosis not present

## 2017-05-20 DIAGNOSIS — E119 Type 2 diabetes mellitus without complications: Secondary | ICD-10-CM | POA: Diagnosis not present

## 2017-05-22 DIAGNOSIS — N2581 Secondary hyperparathyroidism of renal origin: Secondary | ICD-10-CM | POA: Diagnosis not present

## 2017-05-22 DIAGNOSIS — E119 Type 2 diabetes mellitus without complications: Secondary | ICD-10-CM | POA: Diagnosis not present

## 2017-05-22 DIAGNOSIS — I480 Paroxysmal atrial fibrillation: Secondary | ICD-10-CM | POA: Diagnosis not present

## 2017-05-22 DIAGNOSIS — N186 End stage renal disease: Secondary | ICD-10-CM | POA: Diagnosis not present

## 2017-05-24 DIAGNOSIS — N2581 Secondary hyperparathyroidism of renal origin: Secondary | ICD-10-CM | POA: Diagnosis not present

## 2017-05-24 DIAGNOSIS — N186 End stage renal disease: Secondary | ICD-10-CM | POA: Diagnosis not present

## 2017-05-24 DIAGNOSIS — E119 Type 2 diabetes mellitus without complications: Secondary | ICD-10-CM | POA: Diagnosis not present

## 2017-05-27 DIAGNOSIS — E119 Type 2 diabetes mellitus without complications: Secondary | ICD-10-CM | POA: Diagnosis not present

## 2017-05-27 DIAGNOSIS — N186 End stage renal disease: Secondary | ICD-10-CM | POA: Diagnosis not present

## 2017-05-27 DIAGNOSIS — N2581 Secondary hyperparathyroidism of renal origin: Secondary | ICD-10-CM | POA: Diagnosis not present

## 2017-05-29 DIAGNOSIS — N2581 Secondary hyperparathyroidism of renal origin: Secondary | ICD-10-CM | POA: Diagnosis not present

## 2017-05-29 DIAGNOSIS — N186 End stage renal disease: Secondary | ICD-10-CM | POA: Diagnosis not present

## 2017-05-29 DIAGNOSIS — E119 Type 2 diabetes mellitus without complications: Secondary | ICD-10-CM | POA: Diagnosis not present

## 2017-05-30 ENCOUNTER — Ambulatory Visit (INDEPENDENT_AMBULATORY_CARE_PROVIDER_SITE_OTHER): Payer: Medicare Other | Admitting: *Deleted

## 2017-05-30 DIAGNOSIS — I480 Paroxysmal atrial fibrillation: Secondary | ICD-10-CM | POA: Diagnosis not present

## 2017-05-30 DIAGNOSIS — Z5181 Encounter for therapeutic drug level monitoring: Secondary | ICD-10-CM

## 2017-05-30 LAB — POCT INR: INR: 2.4 (ref 2.0–3.0)

## 2017-05-30 NOTE — Patient Instructions (Signed)
Description   Continue taking 1 tablet daily except 1.5 tablets on Thursdays. Recheck INR in 6 weeks. [phone] -coumadin clinic, call with changes in medication or procedures     

## 2017-05-31 ENCOUNTER — Other Ambulatory Visit: Payer: Self-pay | Admitting: Neurology

## 2017-05-31 DIAGNOSIS — N186 End stage renal disease: Secondary | ICD-10-CM | POA: Diagnosis not present

## 2017-05-31 DIAGNOSIS — G4761 Periodic limb movement disorder: Secondary | ICD-10-CM

## 2017-05-31 DIAGNOSIS — Z9989 Dependence on other enabling machines and devices: Principal | ICD-10-CM

## 2017-05-31 DIAGNOSIS — G2581 Restless legs syndrome: Secondary | ICD-10-CM

## 2017-05-31 DIAGNOSIS — G629 Polyneuropathy, unspecified: Secondary | ICD-10-CM

## 2017-05-31 DIAGNOSIS — I482 Chronic atrial fibrillation, unspecified: Secondary | ICD-10-CM

## 2017-05-31 DIAGNOSIS — E119 Type 2 diabetes mellitus without complications: Secondary | ICD-10-CM | POA: Diagnosis not present

## 2017-05-31 DIAGNOSIS — N2581 Secondary hyperparathyroidism of renal origin: Secondary | ICD-10-CM | POA: Diagnosis not present

## 2017-05-31 DIAGNOSIS — G4733 Obstructive sleep apnea (adult) (pediatric): Secondary | ICD-10-CM

## 2017-06-03 DIAGNOSIS — E119 Type 2 diabetes mellitus without complications: Secondary | ICD-10-CM | POA: Diagnosis not present

## 2017-06-03 DIAGNOSIS — N2581 Secondary hyperparathyroidism of renal origin: Secondary | ICD-10-CM | POA: Diagnosis not present

## 2017-06-03 DIAGNOSIS — N186 End stage renal disease: Secondary | ICD-10-CM | POA: Diagnosis not present

## 2017-06-05 DIAGNOSIS — N186 End stage renal disease: Secondary | ICD-10-CM | POA: Diagnosis not present

## 2017-06-05 DIAGNOSIS — E119 Type 2 diabetes mellitus without complications: Secondary | ICD-10-CM | POA: Diagnosis not present

## 2017-06-05 DIAGNOSIS — N2581 Secondary hyperparathyroidism of renal origin: Secondary | ICD-10-CM | POA: Diagnosis not present

## 2017-06-07 DIAGNOSIS — E119 Type 2 diabetes mellitus without complications: Secondary | ICD-10-CM | POA: Diagnosis not present

## 2017-06-07 DIAGNOSIS — N186 End stage renal disease: Secondary | ICD-10-CM | POA: Diagnosis not present

## 2017-06-07 DIAGNOSIS — N2581 Secondary hyperparathyroidism of renal origin: Secondary | ICD-10-CM | POA: Diagnosis not present

## 2017-06-08 DIAGNOSIS — N186 End stage renal disease: Secondary | ICD-10-CM | POA: Diagnosis not present

## 2017-06-08 DIAGNOSIS — E1129 Type 2 diabetes mellitus with other diabetic kidney complication: Secondary | ICD-10-CM | POA: Diagnosis not present

## 2017-06-08 DIAGNOSIS — Z992 Dependence on renal dialysis: Secondary | ICD-10-CM | POA: Diagnosis not present

## 2017-06-10 DIAGNOSIS — E119 Type 2 diabetes mellitus without complications: Secondary | ICD-10-CM | POA: Diagnosis not present

## 2017-06-10 DIAGNOSIS — N2581 Secondary hyperparathyroidism of renal origin: Secondary | ICD-10-CM | POA: Diagnosis not present

## 2017-06-10 DIAGNOSIS — N186 End stage renal disease: Secondary | ICD-10-CM | POA: Diagnosis not present

## 2017-06-12 DIAGNOSIS — E119 Type 2 diabetes mellitus without complications: Secondary | ICD-10-CM | POA: Diagnosis not present

## 2017-06-12 DIAGNOSIS — N2581 Secondary hyperparathyroidism of renal origin: Secondary | ICD-10-CM | POA: Diagnosis not present

## 2017-06-12 DIAGNOSIS — N186 End stage renal disease: Secondary | ICD-10-CM | POA: Diagnosis not present

## 2017-06-14 DIAGNOSIS — N2581 Secondary hyperparathyroidism of renal origin: Secondary | ICD-10-CM | POA: Diagnosis not present

## 2017-06-14 DIAGNOSIS — E119 Type 2 diabetes mellitus without complications: Secondary | ICD-10-CM | POA: Diagnosis not present

## 2017-06-14 DIAGNOSIS — N186 End stage renal disease: Secondary | ICD-10-CM | POA: Diagnosis not present

## 2017-06-17 DIAGNOSIS — E119 Type 2 diabetes mellitus without complications: Secondary | ICD-10-CM | POA: Diagnosis not present

## 2017-06-17 DIAGNOSIS — N186 End stage renal disease: Secondary | ICD-10-CM | POA: Diagnosis not present

## 2017-06-17 DIAGNOSIS — N2581 Secondary hyperparathyroidism of renal origin: Secondary | ICD-10-CM | POA: Diagnosis not present

## 2017-06-19 DIAGNOSIS — N186 End stage renal disease: Secondary | ICD-10-CM | POA: Diagnosis not present

## 2017-06-19 DIAGNOSIS — E119 Type 2 diabetes mellitus without complications: Secondary | ICD-10-CM | POA: Diagnosis not present

## 2017-06-19 DIAGNOSIS — N2581 Secondary hyperparathyroidism of renal origin: Secondary | ICD-10-CM | POA: Diagnosis not present

## 2017-06-21 DIAGNOSIS — N186 End stage renal disease: Secondary | ICD-10-CM | POA: Diagnosis not present

## 2017-06-21 DIAGNOSIS — E119 Type 2 diabetes mellitus without complications: Secondary | ICD-10-CM | POA: Diagnosis not present

## 2017-06-21 DIAGNOSIS — N2581 Secondary hyperparathyroidism of renal origin: Secondary | ICD-10-CM | POA: Diagnosis not present

## 2017-06-24 DIAGNOSIS — I480 Paroxysmal atrial fibrillation: Secondary | ICD-10-CM | POA: Diagnosis not present

## 2017-06-24 DIAGNOSIS — E119 Type 2 diabetes mellitus without complications: Secondary | ICD-10-CM | POA: Diagnosis not present

## 2017-06-24 DIAGNOSIS — N186 End stage renal disease: Secondary | ICD-10-CM | POA: Diagnosis not present

## 2017-06-24 DIAGNOSIS — N2581 Secondary hyperparathyroidism of renal origin: Secondary | ICD-10-CM | POA: Diagnosis not present

## 2017-06-25 ENCOUNTER — Telehealth: Payer: Self-pay | Admitting: Cardiology

## 2017-06-25 ENCOUNTER — Ambulatory Visit (INDEPENDENT_AMBULATORY_CARE_PROVIDER_SITE_OTHER): Payer: Medicare Other | Admitting: Cardiology

## 2017-06-25 ENCOUNTER — Encounter: Payer: Self-pay | Admitting: Cardiology

## 2017-06-25 VITALS — BP 130/76 | Ht 73.0 in | Wt 272.0 lb

## 2017-06-25 DIAGNOSIS — E785 Hyperlipidemia, unspecified: Secondary | ICD-10-CM | POA: Diagnosis not present

## 2017-06-25 DIAGNOSIS — Z992 Dependence on renal dialysis: Secondary | ICD-10-CM | POA: Diagnosis not present

## 2017-06-25 DIAGNOSIS — I10 Essential (primary) hypertension: Secondary | ICD-10-CM

## 2017-06-25 DIAGNOSIS — Z9989 Dependence on other enabling machines and devices: Secondary | ICD-10-CM | POA: Diagnosis not present

## 2017-06-25 DIAGNOSIS — G4733 Obstructive sleep apnea (adult) (pediatric): Secondary | ICD-10-CM | POA: Diagnosis not present

## 2017-06-25 DIAGNOSIS — I4821 Permanent atrial fibrillation: Secondary | ICD-10-CM

## 2017-06-25 DIAGNOSIS — N186 End stage renal disease: Secondary | ICD-10-CM

## 2017-06-25 DIAGNOSIS — I482 Chronic atrial fibrillation: Secondary | ICD-10-CM

## 2017-06-25 MED ORDER — METOPROLOL TARTRATE 75 MG PO TABS: 75.0000 mg | ORAL_TABLET | Freq: Two times a day (BID) | ORAL | 3 refills | Status: DC
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
## 2017-06-25 NOTE — Patient Instructions (Addendum)
Medication Instructions: Your physician has recommended you make the following change in your medication:  INCREASE: Metoprolol (Lopressor) to 75 mg twice a day     Labwork: None  Procedures/Testing: None  Follow-Up:Your physician recommends that you schedule a follow-up appointment in: 6 months with Dr.Ross     Any Additional Special Instructions Will Be Listed Below (If Applicable).     If you need a refill on your cardiac medications before your next appointment, please call your pharmacy.

## 2017-06-25 NOTE — Telephone Encounter (Signed)
Metoprolol 75mg  need  prior auth please call this number to get auth [id number].    Please call [place] back when Josem Kaufmann has been done at [phone]

## 2017-06-25 NOTE — Telephone Encounter (Signed)
I did a Metoprolol PA over the phone with Buffy at [facility]. Per Buffy we will receive the determination within 24 to 72 hours.

## 2017-06-26 DIAGNOSIS — E119 Type 2 diabetes mellitus without complications: Secondary | ICD-10-CM | POA: Diagnosis not present

## 2017-06-26 DIAGNOSIS — N186 End stage renal disease: Secondary | ICD-10-CM | POA: Diagnosis not present

## 2017-06-26 DIAGNOSIS — N2581 Secondary hyperparathyroidism of renal origin: Secondary | ICD-10-CM | POA: Diagnosis not present

## 2017-06-28 DIAGNOSIS — E119 Type 2 diabetes mellitus without complications: Secondary | ICD-10-CM | POA: Diagnosis not present

## 2017-06-28 DIAGNOSIS — N186 End stage renal disease: Secondary | ICD-10-CM | POA: Diagnosis not present

## 2017-06-28 DIAGNOSIS — N2581 Secondary hyperparathyroidism of renal origin: Secondary | ICD-10-CM | POA: Diagnosis not present

## 2017-06-28 NOTE — Telephone Encounter (Signed)
Letter received via fax from [facility] stating that this Metoprolol PA has been approved. Approval good until 01/07/2018. Reference# [id number]

## 2017-06-29 NOTE — Progress Notes (Signed)
REviewed note from  [place] has had holter in the past  I would get another to confirm rate control is on average below 100 Make it 48 hour

## 2017-07-01 ENCOUNTER — Other Ambulatory Visit: Payer: Self-pay | Admitting: *Deleted

## 2017-07-01 DIAGNOSIS — E119 Type 2 diabetes mellitus without complications: Secondary | ICD-10-CM | POA: Diagnosis not present

## 2017-07-01 DIAGNOSIS — I4821 Permanent atrial fibrillation: Secondary | ICD-10-CM

## 2017-07-01 DIAGNOSIS — N2581 Secondary hyperparathyroidism of renal origin: Secondary | ICD-10-CM | POA: Diagnosis not present

## 2017-07-01 DIAGNOSIS — N186 End stage renal disease: Secondary | ICD-10-CM | POA: Diagnosis not present

## 2017-07-01 NOTE — Progress Notes (Signed)
Order for monitor placed. Spoke with patient's wife and informed of plan and to be expecting call from scheduler.

## 2017-07-01 NOTE — Progress Notes (Signed)
Received message from Dr. Harrington Challenger:   REviewed note from  [place] has had holter in the past  I would get another to confirm rate control is on average below 100 Make it 45 hour    Spoke with patient's wife (DPR) and informed. Will try to coordinate with [facility] appointment on 7/2.

## 2017-07-03 DIAGNOSIS — N2581 Secondary hyperparathyroidism of renal origin: Secondary | ICD-10-CM | POA: Diagnosis not present

## 2017-07-03 DIAGNOSIS — E119 Type 2 diabetes mellitus without complications: Secondary | ICD-10-CM | POA: Diagnosis not present

## 2017-07-03 DIAGNOSIS — N186 End stage renal disease: Secondary | ICD-10-CM | POA: Diagnosis not present

## 2017-07-03 DIAGNOSIS — I480 Paroxysmal atrial fibrillation: Secondary | ICD-10-CM | POA: Diagnosis not present

## 2017-07-05 DIAGNOSIS — N2581 Secondary hyperparathyroidism of renal origin: Secondary | ICD-10-CM | POA: Diagnosis not present

## 2017-07-05 DIAGNOSIS — E119 Type 2 diabetes mellitus without complications: Secondary | ICD-10-CM | POA: Diagnosis not present

## 2017-07-05 DIAGNOSIS — N186 End stage renal disease: Secondary | ICD-10-CM | POA: Diagnosis not present

## 2017-07-08 DIAGNOSIS — N186 End stage renal disease: Secondary | ICD-10-CM | POA: Diagnosis not present

## 2017-07-08 DIAGNOSIS — E1129 Type 2 diabetes mellitus with other diabetic kidney complication: Secondary | ICD-10-CM | POA: Diagnosis not present

## 2017-07-08 DIAGNOSIS — Z992 Dependence on renal dialysis: Secondary | ICD-10-CM | POA: Diagnosis not present

## 2017-07-08 DIAGNOSIS — E119 Type 2 diabetes mellitus without complications: Secondary | ICD-10-CM | POA: Diagnosis not present

## 2017-07-08 DIAGNOSIS — I480 Paroxysmal atrial fibrillation: Secondary | ICD-10-CM | POA: Diagnosis not present

## 2017-07-08 DIAGNOSIS — N2581 Secondary hyperparathyroidism of renal origin: Secondary | ICD-10-CM | POA: Diagnosis not present

## 2017-07-09 ENCOUNTER — Ambulatory Visit (INDEPENDENT_AMBULATORY_CARE_PROVIDER_SITE_OTHER): Payer: Medicare Other | Admitting: *Deleted

## 2017-07-09 DIAGNOSIS — Z5181 Encounter for therapeutic drug level monitoring: Secondary | ICD-10-CM

## 2017-07-09 DIAGNOSIS — I480 Paroxysmal atrial fibrillation: Secondary | ICD-10-CM

## 2017-07-09 LAB — POCT INR: INR: 2.5 (ref 2.0–3.0)

## 2017-07-09 NOTE — Patient Instructions (Signed)
Description   Continue taking 1 tablet daily except 1.5 tablets on Thursdays. Recheck INR in 6 weeks. [phone] -coumadin clinic, call with changes in medication or procedures

## 2017-07-10 DIAGNOSIS — N186 End stage renal disease: Secondary | ICD-10-CM | POA: Diagnosis not present

## 2017-07-10 DIAGNOSIS — N2581 Secondary hyperparathyroidism of renal origin: Secondary | ICD-10-CM | POA: Diagnosis not present

## 2017-07-10 DIAGNOSIS — E119 Type 2 diabetes mellitus without complications: Secondary | ICD-10-CM | POA: Diagnosis not present

## 2017-07-12 DIAGNOSIS — E119 Type 2 diabetes mellitus without complications: Secondary | ICD-10-CM | POA: Diagnosis not present

## 2017-07-12 DIAGNOSIS — N186 End stage renal disease: Secondary | ICD-10-CM | POA: Diagnosis not present

## 2017-07-12 DIAGNOSIS — N2581 Secondary hyperparathyroidism of renal origin: Secondary | ICD-10-CM | POA: Diagnosis not present

## 2017-07-15 DIAGNOSIS — E119 Type 2 diabetes mellitus without complications: Secondary | ICD-10-CM | POA: Diagnosis not present

## 2017-07-15 DIAGNOSIS — N186 End stage renal disease: Secondary | ICD-10-CM | POA: Diagnosis not present

## 2017-07-15 DIAGNOSIS — N2581 Secondary hyperparathyroidism of renal origin: Secondary | ICD-10-CM | POA: Diagnosis not present

## 2017-07-16 ENCOUNTER — Ambulatory Visit (INDEPENDENT_AMBULATORY_CARE_PROVIDER_SITE_OTHER): Payer: Medicare Other

## 2017-07-16 DIAGNOSIS — I482 Chronic atrial fibrillation: Secondary | ICD-10-CM | POA: Diagnosis not present

## 2017-07-16 DIAGNOSIS — I4821 Permanent atrial fibrillation: Secondary | ICD-10-CM

## 2017-07-17 DIAGNOSIS — N2581 Secondary hyperparathyroidism of renal origin: Secondary | ICD-10-CM | POA: Diagnosis not present

## 2017-07-17 DIAGNOSIS — N186 End stage renal disease: Secondary | ICD-10-CM | POA: Diagnosis not present

## 2017-07-17 DIAGNOSIS — E119 Type 2 diabetes mellitus without complications: Secondary | ICD-10-CM | POA: Diagnosis not present

## 2017-07-19 DIAGNOSIS — E119 Type 2 diabetes mellitus without complications: Secondary | ICD-10-CM | POA: Diagnosis not present

## 2017-07-19 DIAGNOSIS — N2581 Secondary hyperparathyroidism of renal origin: Secondary | ICD-10-CM | POA: Diagnosis not present

## 2017-07-19 DIAGNOSIS — N186 End stage renal disease: Secondary | ICD-10-CM | POA: Diagnosis not present

## 2017-07-22 DIAGNOSIS — N2581 Secondary hyperparathyroidism of renal origin: Secondary | ICD-10-CM | POA: Diagnosis not present

## 2017-07-22 DIAGNOSIS — N186 End stage renal disease: Secondary | ICD-10-CM | POA: Diagnosis not present

## 2017-07-22 DIAGNOSIS — E119 Type 2 diabetes mellitus without complications: Secondary | ICD-10-CM | POA: Diagnosis not present

## 2017-07-24 DIAGNOSIS — N2581 Secondary hyperparathyroidism of renal origin: Secondary | ICD-10-CM | POA: Diagnosis not present

## 2017-07-24 DIAGNOSIS — N186 End stage renal disease: Secondary | ICD-10-CM | POA: Diagnosis not present

## 2017-07-24 DIAGNOSIS — E119 Type 2 diabetes mellitus without complications: Secondary | ICD-10-CM | POA: Diagnosis not present

## 2017-07-26 DIAGNOSIS — E119 Type 2 diabetes mellitus without complications: Secondary | ICD-10-CM | POA: Diagnosis not present

## 2017-07-26 DIAGNOSIS — N2581 Secondary hyperparathyroidism of renal origin: Secondary | ICD-10-CM | POA: Diagnosis not present

## 2017-07-26 DIAGNOSIS — N186 End stage renal disease: Secondary | ICD-10-CM | POA: Diagnosis not present

## 2017-07-29 ENCOUNTER — Telehealth: Payer: Self-pay | Admitting: *Deleted

## 2017-07-29 DIAGNOSIS — E119 Type 2 diabetes mellitus without complications: Secondary | ICD-10-CM | POA: Diagnosis not present

## 2017-07-29 DIAGNOSIS — N2581 Secondary hyperparathyroidism of renal origin: Secondary | ICD-10-CM | POA: Diagnosis not present

## 2017-07-29 DIAGNOSIS — N186 End stage renal disease: Secondary | ICD-10-CM | POA: Diagnosis not present

## 2017-07-29 MED ORDER — METOPROLOL TARTRATE 50 MG PO TABS: 100.0000 mg | ORAL_TABLET | Freq: Two times a day (BID) | ORAL | 6 refills | Status: DC

## 2017-07-29 NOTE — Telephone Encounter (Signed)
-----   Message from Fay Records, MD sent at 07/25/2017 10:00 PM EDT ----- Holter shows atrial fibrillatoin  Average HR is a little high  Would icnrease metoprolol to 100 bid

## 2017-07-29 NOTE — Telephone Encounter (Signed)
Called patient and informed. Sent new prescription to pharmacy.

## 2017-07-31 DIAGNOSIS — N2581 Secondary hyperparathyroidism of renal origin: Secondary | ICD-10-CM | POA: Diagnosis not present

## 2017-07-31 DIAGNOSIS — E119 Type 2 diabetes mellitus without complications: Secondary | ICD-10-CM | POA: Diagnosis not present

## 2017-07-31 DIAGNOSIS — N186 End stage renal disease: Secondary | ICD-10-CM | POA: Diagnosis not present

## 2017-08-02 DIAGNOSIS — N186 End stage renal disease: Secondary | ICD-10-CM | POA: Diagnosis not present

## 2017-08-02 DIAGNOSIS — N2581 Secondary hyperparathyroidism of renal origin: Secondary | ICD-10-CM | POA: Diagnosis not present

## 2017-08-02 DIAGNOSIS — E119 Type 2 diabetes mellitus without complications: Secondary | ICD-10-CM | POA: Diagnosis not present

## 2017-08-05 DIAGNOSIS — N186 End stage renal disease: Secondary | ICD-10-CM | POA: Diagnosis not present

## 2017-08-05 DIAGNOSIS — N2581 Secondary hyperparathyroidism of renal origin: Secondary | ICD-10-CM | POA: Diagnosis not present

## 2017-08-05 DIAGNOSIS — E119 Type 2 diabetes mellitus without complications: Secondary | ICD-10-CM | POA: Diagnosis not present

## 2017-08-07 DIAGNOSIS — E119 Type 2 diabetes mellitus without complications: Secondary | ICD-10-CM | POA: Diagnosis not present

## 2017-08-07 DIAGNOSIS — N186 End stage renal disease: Secondary | ICD-10-CM | POA: Diagnosis not present

## 2017-08-07 DIAGNOSIS — N2581 Secondary hyperparathyroidism of renal origin: Secondary | ICD-10-CM | POA: Diagnosis not present

## 2017-08-08 DIAGNOSIS — E1129 Type 2 diabetes mellitus with other diabetic kidney complication: Secondary | ICD-10-CM | POA: Diagnosis not present

## 2017-08-08 DIAGNOSIS — Z992 Dependence on renal dialysis: Secondary | ICD-10-CM | POA: Diagnosis not present

## 2017-08-08 DIAGNOSIS — N186 End stage renal disease: Secondary | ICD-10-CM | POA: Diagnosis not present

## 2017-08-09 DIAGNOSIS — N2581 Secondary hyperparathyroidism of renal origin: Secondary | ICD-10-CM | POA: Diagnosis not present

## 2017-08-09 DIAGNOSIS — E119 Type 2 diabetes mellitus without complications: Secondary | ICD-10-CM | POA: Diagnosis not present

## 2017-08-09 DIAGNOSIS — N186 End stage renal disease: Secondary | ICD-10-CM | POA: Diagnosis not present

## 2017-08-12 DIAGNOSIS — N2581 Secondary hyperparathyroidism of renal origin: Secondary | ICD-10-CM | POA: Diagnosis not present

## 2017-08-12 DIAGNOSIS — I480 Paroxysmal atrial fibrillation: Secondary | ICD-10-CM | POA: Diagnosis not present

## 2017-08-12 DIAGNOSIS — N186 End stage renal disease: Secondary | ICD-10-CM | POA: Diagnosis not present

## 2017-08-12 DIAGNOSIS — E119 Type 2 diabetes mellitus without complications: Secondary | ICD-10-CM | POA: Diagnosis not present

## 2017-08-14 DIAGNOSIS — N2581 Secondary hyperparathyroidism of renal origin: Secondary | ICD-10-CM | POA: Diagnosis not present

## 2017-08-14 DIAGNOSIS — N186 End stage renal disease: Secondary | ICD-10-CM | POA: Diagnosis not present

## 2017-08-14 DIAGNOSIS — E119 Type 2 diabetes mellitus without complications: Secondary | ICD-10-CM | POA: Diagnosis not present

## 2017-08-16 DIAGNOSIS — E119 Type 2 diabetes mellitus without complications: Secondary | ICD-10-CM | POA: Diagnosis not present

## 2017-08-16 DIAGNOSIS — N2581 Secondary hyperparathyroidism of renal origin: Secondary | ICD-10-CM | POA: Diagnosis not present

## 2017-08-16 DIAGNOSIS — N186 End stage renal disease: Secondary | ICD-10-CM | POA: Diagnosis not present

## 2017-08-19 DIAGNOSIS — N2581 Secondary hyperparathyroidism of renal origin: Secondary | ICD-10-CM | POA: Diagnosis not present

## 2017-08-19 DIAGNOSIS — N186 End stage renal disease: Secondary | ICD-10-CM | POA: Diagnosis not present

## 2017-08-19 DIAGNOSIS — E119 Type 2 diabetes mellitus without complications: Secondary | ICD-10-CM | POA: Diagnosis not present

## 2017-08-20 ENCOUNTER — Ambulatory Visit (INDEPENDENT_AMBULATORY_CARE_PROVIDER_SITE_OTHER): Payer: Medicare Other

## 2017-08-20 DIAGNOSIS — I480 Paroxysmal atrial fibrillation: Secondary | ICD-10-CM

## 2017-08-20 DIAGNOSIS — Z5181 Encounter for therapeutic drug level monitoring: Secondary | ICD-10-CM | POA: Diagnosis not present

## 2017-08-20 LAB — POCT INR: INR: 2.2 (ref 2.0–3.0)

## 2017-08-20 NOTE — Patient Instructions (Signed)
Description   Continue taking 1 tablet daily except 1.5 tablets on Thursdays. Recheck INR in 6 weeks. [phone] -coumadin clinic, call with changes in medication or procedures

## 2017-08-21 DIAGNOSIS — N186 End stage renal disease: Secondary | ICD-10-CM | POA: Diagnosis not present

## 2017-08-21 DIAGNOSIS — N2581 Secondary hyperparathyroidism of renal origin: Secondary | ICD-10-CM | POA: Diagnosis not present

## 2017-08-21 DIAGNOSIS — E119 Type 2 diabetes mellitus without complications: Secondary | ICD-10-CM | POA: Diagnosis not present

## 2017-08-23 DIAGNOSIS — N186 End stage renal disease: Secondary | ICD-10-CM | POA: Diagnosis not present

## 2017-08-23 DIAGNOSIS — E119 Type 2 diabetes mellitus without complications: Secondary | ICD-10-CM | POA: Diagnosis not present

## 2017-08-23 DIAGNOSIS — N2581 Secondary hyperparathyroidism of renal origin: Secondary | ICD-10-CM | POA: Diagnosis not present

## 2017-08-26 DIAGNOSIS — N186 End stage renal disease: Secondary | ICD-10-CM | POA: Diagnosis not present

## 2017-08-26 DIAGNOSIS — E119 Type 2 diabetes mellitus without complications: Secondary | ICD-10-CM | POA: Diagnosis not present

## 2017-08-26 DIAGNOSIS — N2581 Secondary hyperparathyroidism of renal origin: Secondary | ICD-10-CM | POA: Diagnosis not present

## 2017-08-28 DIAGNOSIS — N2581 Secondary hyperparathyroidism of renal origin: Secondary | ICD-10-CM | POA: Diagnosis not present

## 2017-08-28 DIAGNOSIS — N186 End stage renal disease: Secondary | ICD-10-CM | POA: Diagnosis not present

## 2017-08-28 DIAGNOSIS — E119 Type 2 diabetes mellitus without complications: Secondary | ICD-10-CM | POA: Diagnosis not present

## 2017-08-30 DIAGNOSIS — E119 Type 2 diabetes mellitus without complications: Secondary | ICD-10-CM | POA: Diagnosis not present

## 2017-08-30 DIAGNOSIS — N186 End stage renal disease: Secondary | ICD-10-CM | POA: Diagnosis not present

## 2017-08-30 DIAGNOSIS — N2581 Secondary hyperparathyroidism of renal origin: Secondary | ICD-10-CM | POA: Diagnosis not present

## 2017-09-02 DIAGNOSIS — E119 Type 2 diabetes mellitus without complications: Secondary | ICD-10-CM | POA: Diagnosis not present

## 2017-09-02 DIAGNOSIS — N2581 Secondary hyperparathyroidism of renal origin: Secondary | ICD-10-CM | POA: Diagnosis not present

## 2017-09-02 DIAGNOSIS — N186 End stage renal disease: Secondary | ICD-10-CM | POA: Diagnosis not present

## 2017-09-04 DIAGNOSIS — N186 End stage renal disease: Secondary | ICD-10-CM | POA: Diagnosis not present

## 2017-09-04 DIAGNOSIS — E119 Type 2 diabetes mellitus without complications: Secondary | ICD-10-CM | POA: Diagnosis not present

## 2017-09-04 DIAGNOSIS — N2581 Secondary hyperparathyroidism of renal origin: Secondary | ICD-10-CM | POA: Diagnosis not present

## 2017-09-06 DIAGNOSIS — E119 Type 2 diabetes mellitus without complications: Secondary | ICD-10-CM | POA: Diagnosis not present

## 2017-09-06 DIAGNOSIS — N186 End stage renal disease: Secondary | ICD-10-CM | POA: Diagnosis not present

## 2017-09-06 DIAGNOSIS — N2581 Secondary hyperparathyroidism of renal origin: Secondary | ICD-10-CM | POA: Diagnosis not present

## 2017-09-08 DIAGNOSIS — E1129 Type 2 diabetes mellitus with other diabetic kidney complication: Secondary | ICD-10-CM | POA: Diagnosis not present

## 2017-09-08 DIAGNOSIS — Z992 Dependence on renal dialysis: Secondary | ICD-10-CM | POA: Diagnosis not present

## 2017-09-08 DIAGNOSIS — N186 End stage renal disease: Secondary | ICD-10-CM | POA: Diagnosis not present

## 2017-09-09 ENCOUNTER — Other Ambulatory Visit: Payer: Self-pay | Admitting: Neurology

## 2017-09-09 DIAGNOSIS — I480 Paroxysmal atrial fibrillation: Secondary | ICD-10-CM | POA: Diagnosis not present

## 2017-09-09 DIAGNOSIS — G629 Polyneuropathy, unspecified: Secondary | ICD-10-CM

## 2017-09-09 DIAGNOSIS — G2581 Restless legs syndrome: Secondary | ICD-10-CM

## 2017-09-09 DIAGNOSIS — N186 End stage renal disease: Secondary | ICD-10-CM | POA: Diagnosis not present

## 2017-09-09 DIAGNOSIS — Z9989 Dependence on other enabling machines and devices: Principal | ICD-10-CM

## 2017-09-09 DIAGNOSIS — N2581 Secondary hyperparathyroidism of renal origin: Secondary | ICD-10-CM | POA: Diagnosis not present

## 2017-09-09 DIAGNOSIS — I482 Chronic atrial fibrillation, unspecified: Secondary | ICD-10-CM

## 2017-09-09 DIAGNOSIS — E119 Type 2 diabetes mellitus without complications: Secondary | ICD-10-CM | POA: Diagnosis not present

## 2017-09-09 DIAGNOSIS — G4761 Periodic limb movement disorder: Secondary | ICD-10-CM

## 2017-09-09 DIAGNOSIS — Z23 Encounter for immunization: Secondary | ICD-10-CM | POA: Diagnosis not present

## 2017-09-09 DIAGNOSIS — G4733 Obstructive sleep apnea (adult) (pediatric): Secondary | ICD-10-CM

## 2017-09-11 DIAGNOSIS — N186 End stage renal disease: Secondary | ICD-10-CM | POA: Diagnosis not present

## 2017-09-11 DIAGNOSIS — E119 Type 2 diabetes mellitus without complications: Secondary | ICD-10-CM | POA: Diagnosis not present

## 2017-09-11 DIAGNOSIS — N2581 Secondary hyperparathyroidism of renal origin: Secondary | ICD-10-CM | POA: Diagnosis not present

## 2017-09-11 DIAGNOSIS — Z23 Encounter for immunization: Secondary | ICD-10-CM | POA: Diagnosis not present

## 2017-09-13 DIAGNOSIS — E119 Type 2 diabetes mellitus without complications: Secondary | ICD-10-CM | POA: Diagnosis not present

## 2017-09-13 DIAGNOSIS — N186 End stage renal disease: Secondary | ICD-10-CM | POA: Diagnosis not present

## 2017-09-13 DIAGNOSIS — N2581 Secondary hyperparathyroidism of renal origin: Secondary | ICD-10-CM | POA: Diagnosis not present

## 2017-09-13 DIAGNOSIS — Z23 Encounter for immunization: Secondary | ICD-10-CM | POA: Diagnosis not present

## 2017-09-16 DIAGNOSIS — N2581 Secondary hyperparathyroidism of renal origin: Secondary | ICD-10-CM | POA: Diagnosis not present

## 2017-09-16 DIAGNOSIS — Z23 Encounter for immunization: Secondary | ICD-10-CM | POA: Diagnosis not present

## 2017-09-16 DIAGNOSIS — E119 Type 2 diabetes mellitus without complications: Secondary | ICD-10-CM | POA: Diagnosis not present

## 2017-09-16 DIAGNOSIS — N186 End stage renal disease: Secondary | ICD-10-CM | POA: Diagnosis not present

## 2017-09-18 DIAGNOSIS — N2581 Secondary hyperparathyroidism of renal origin: Secondary | ICD-10-CM | POA: Diagnosis not present

## 2017-09-18 DIAGNOSIS — N186 End stage renal disease: Secondary | ICD-10-CM | POA: Diagnosis not present

## 2017-09-18 DIAGNOSIS — E119 Type 2 diabetes mellitus without complications: Secondary | ICD-10-CM | POA: Diagnosis not present

## 2017-09-18 DIAGNOSIS — Z23 Encounter for immunization: Secondary | ICD-10-CM | POA: Diagnosis not present

## 2017-09-20 DIAGNOSIS — N186 End stage renal disease: Secondary | ICD-10-CM | POA: Diagnosis not present

## 2017-09-20 DIAGNOSIS — Z23 Encounter for immunization: Secondary | ICD-10-CM | POA: Diagnosis not present

## 2017-09-20 DIAGNOSIS — E119 Type 2 diabetes mellitus without complications: Secondary | ICD-10-CM | POA: Diagnosis not present

## 2017-09-20 DIAGNOSIS — N2581 Secondary hyperparathyroidism of renal origin: Secondary | ICD-10-CM | POA: Diagnosis not present

## 2017-09-23 ENCOUNTER — Encounter: Payer: Self-pay | Admitting: Adult Health

## 2017-09-23 DIAGNOSIS — N186 End stage renal disease: Secondary | ICD-10-CM | POA: Diagnosis not present

## 2017-09-23 DIAGNOSIS — N2581 Secondary hyperparathyroidism of renal origin: Secondary | ICD-10-CM | POA: Diagnosis not present

## 2017-09-23 DIAGNOSIS — Z23 Encounter for immunization: Secondary | ICD-10-CM | POA: Diagnosis not present

## 2017-09-23 DIAGNOSIS — E119 Type 2 diabetes mellitus without complications: Secondary | ICD-10-CM | POA: Diagnosis not present

## 2017-09-24 ENCOUNTER — Ambulatory Visit (INDEPENDENT_AMBULATORY_CARE_PROVIDER_SITE_OTHER): Payer: Medicare Other | Admitting: Adult Health

## 2017-09-24 ENCOUNTER — Encounter: Payer: Self-pay | Admitting: Adult Health

## 2017-09-24 VITALS — BP 111/71 | HR 85 | Ht 73.0 in | Wt 276.0 lb

## 2017-09-24 DIAGNOSIS — G4733 Obstructive sleep apnea (adult) (pediatric): Secondary | ICD-10-CM | POA: Diagnosis not present

## 2017-09-24 DIAGNOSIS — Z9989 Dependence on other enabling machines and devices: Secondary | ICD-10-CM

## 2017-09-24 NOTE — Patient Instructions (Signed)
Your Plan:  Continue using cpap nightly and > 4 hours each night If your symptoms worsen or you develop new symptoms please let us know.   Thank you for coming to see [place] at [facility]. I hope we have been able to provide you high quality care today.  You may receive a patient satisfaction survey over the next few weeks. We would appreciate your feedback and comments so that we may continue to improve ourselves and the health of our patients.

## 2017-09-24 NOTE — Progress Notes (Signed)
CPAP order: straps replaced twice a year, mask refitting faxed to [place].
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
## 2017-09-25 DIAGNOSIS — Z23 Encounter for immunization: Secondary | ICD-10-CM | POA: Diagnosis not present

## 2017-09-25 DIAGNOSIS — N2581 Secondary hyperparathyroidism of renal origin: Secondary | ICD-10-CM | POA: Diagnosis not present

## 2017-09-25 DIAGNOSIS — N186 End stage renal disease: Secondary | ICD-10-CM | POA: Diagnosis not present

## 2017-09-25 DIAGNOSIS — E119 Type 2 diabetes mellitus without complications: Secondary | ICD-10-CM | POA: Diagnosis not present

## 2017-09-27 DIAGNOSIS — Z23 Encounter for immunization: Secondary | ICD-10-CM | POA: Diagnosis not present

## 2017-09-27 DIAGNOSIS — N2581 Secondary hyperparathyroidism of renal origin: Secondary | ICD-10-CM | POA: Diagnosis not present

## 2017-09-27 DIAGNOSIS — E119 Type 2 diabetes mellitus without complications: Secondary | ICD-10-CM | POA: Diagnosis not present

## 2017-09-27 DIAGNOSIS — N186 End stage renal disease: Secondary | ICD-10-CM | POA: Diagnosis not present

## 2017-09-30 DIAGNOSIS — E119 Type 2 diabetes mellitus without complications: Secondary | ICD-10-CM | POA: Diagnosis not present

## 2017-09-30 DIAGNOSIS — N186 End stage renal disease: Secondary | ICD-10-CM | POA: Diagnosis not present

## 2017-09-30 DIAGNOSIS — Z23 Encounter for immunization: Secondary | ICD-10-CM | POA: Diagnosis not present

## 2017-09-30 DIAGNOSIS — N2581 Secondary hyperparathyroidism of renal origin: Secondary | ICD-10-CM | POA: Diagnosis not present

## 2017-10-01 ENCOUNTER — Ambulatory Visit (INDEPENDENT_AMBULATORY_CARE_PROVIDER_SITE_OTHER): Payer: Medicare Other

## 2017-10-01 DIAGNOSIS — Z5181 Encounter for therapeutic drug level monitoring: Secondary | ICD-10-CM

## 2017-10-01 DIAGNOSIS — I480 Paroxysmal atrial fibrillation: Secondary | ICD-10-CM | POA: Diagnosis not present

## 2017-10-01 LAB — POCT INR: INR: 3.4 — AB (ref 2.0–3.0)

## 2017-10-01 NOTE — Patient Instructions (Signed)
Please skip coumadin tonight, then continue taking 1 tablet daily except 1.5 tablets on Thursdays. Recheck INR in 5 weeks.  Please be consistent with your greens intake.  [phone] -coumadin clinic, call with changes in medication or procedures

## 2017-10-02 DIAGNOSIS — E119 Type 2 diabetes mellitus without complications: Secondary | ICD-10-CM | POA: Diagnosis not present

## 2017-10-02 DIAGNOSIS — N186 End stage renal disease: Secondary | ICD-10-CM | POA: Diagnosis not present

## 2017-10-02 DIAGNOSIS — Z23 Encounter for immunization: Secondary | ICD-10-CM | POA: Diagnosis not present

## 2017-10-02 DIAGNOSIS — N2581 Secondary hyperparathyroidism of renal origin: Secondary | ICD-10-CM | POA: Diagnosis not present

## 2017-10-02 DIAGNOSIS — I480 Paroxysmal atrial fibrillation: Secondary | ICD-10-CM | POA: Diagnosis not present

## 2017-10-04 DIAGNOSIS — E119 Type 2 diabetes mellitus without complications: Secondary | ICD-10-CM | POA: Diagnosis not present

## 2017-10-04 DIAGNOSIS — N2581 Secondary hyperparathyroidism of renal origin: Secondary | ICD-10-CM | POA: Diagnosis not present

## 2017-10-04 DIAGNOSIS — Z23 Encounter for immunization: Secondary | ICD-10-CM | POA: Diagnosis not present

## 2017-10-04 DIAGNOSIS — N186 End stage renal disease: Secondary | ICD-10-CM | POA: Diagnosis not present

## 2017-10-07 DIAGNOSIS — N2581 Secondary hyperparathyroidism of renal origin: Secondary | ICD-10-CM | POA: Diagnosis not present

## 2017-10-07 DIAGNOSIS — Z23 Encounter for immunization: Secondary | ICD-10-CM | POA: Diagnosis not present

## 2017-10-07 DIAGNOSIS — N186 End stage renal disease: Secondary | ICD-10-CM | POA: Diagnosis not present

## 2017-10-07 DIAGNOSIS — E119 Type 2 diabetes mellitus without complications: Secondary | ICD-10-CM | POA: Diagnosis not present

## 2017-10-08 DIAGNOSIS — E1129 Type 2 diabetes mellitus with other diabetic kidney complication: Secondary | ICD-10-CM | POA: Diagnosis not present

## 2017-10-08 DIAGNOSIS — Z992 Dependence on renal dialysis: Secondary | ICD-10-CM | POA: Diagnosis not present

## 2017-10-08 DIAGNOSIS — N186 End stage renal disease: Secondary | ICD-10-CM | POA: Diagnosis not present

## 2017-10-09 DIAGNOSIS — E119 Type 2 diabetes mellitus without complications: Secondary | ICD-10-CM | POA: Diagnosis not present

## 2017-10-09 DIAGNOSIS — N2581 Secondary hyperparathyroidism of renal origin: Secondary | ICD-10-CM | POA: Diagnosis not present

## 2017-10-09 DIAGNOSIS — N186 End stage renal disease: Secondary | ICD-10-CM | POA: Diagnosis not present

## 2017-10-11 DIAGNOSIS — N186 End stage renal disease: Secondary | ICD-10-CM | POA: Diagnosis not present

## 2017-10-11 DIAGNOSIS — N2581 Secondary hyperparathyroidism of renal origin: Secondary | ICD-10-CM | POA: Diagnosis not present

## 2017-10-11 DIAGNOSIS — E119 Type 2 diabetes mellitus without complications: Secondary | ICD-10-CM | POA: Diagnosis not present

## 2017-10-14 DIAGNOSIS — N2581 Secondary hyperparathyroidism of renal origin: Secondary | ICD-10-CM | POA: Diagnosis not present

## 2017-10-14 DIAGNOSIS — I480 Paroxysmal atrial fibrillation: Secondary | ICD-10-CM | POA: Diagnosis not present

## 2017-10-14 DIAGNOSIS — N186 End stage renal disease: Secondary | ICD-10-CM | POA: Diagnosis not present

## 2017-10-14 DIAGNOSIS — E119 Type 2 diabetes mellitus without complications: Secondary | ICD-10-CM | POA: Diagnosis not present

## 2017-10-16 DIAGNOSIS — E119 Type 2 diabetes mellitus without complications: Secondary | ICD-10-CM | POA: Diagnosis not present

## 2017-10-16 DIAGNOSIS — N186 End stage renal disease: Secondary | ICD-10-CM | POA: Diagnosis not present

## 2017-10-16 DIAGNOSIS — N2581 Secondary hyperparathyroidism of renal origin: Secondary | ICD-10-CM | POA: Diagnosis not present

## 2017-10-18 DIAGNOSIS — N2581 Secondary hyperparathyroidism of renal origin: Secondary | ICD-10-CM | POA: Diagnosis not present

## 2017-10-18 DIAGNOSIS — N186 End stage renal disease: Secondary | ICD-10-CM | POA: Diagnosis not present

## 2017-10-18 DIAGNOSIS — E119 Type 2 diabetes mellitus without complications: Secondary | ICD-10-CM | POA: Diagnosis not present

## 2017-10-21 DIAGNOSIS — N2581 Secondary hyperparathyroidism of renal origin: Secondary | ICD-10-CM | POA: Diagnosis not present

## 2017-10-21 DIAGNOSIS — E119 Type 2 diabetes mellitus without complications: Secondary | ICD-10-CM | POA: Diagnosis not present

## 2017-10-21 DIAGNOSIS — N186 End stage renal disease: Secondary | ICD-10-CM | POA: Diagnosis not present

## 2017-10-23 DIAGNOSIS — N186 End stage renal disease: Secondary | ICD-10-CM | POA: Diagnosis not present

## 2017-10-23 DIAGNOSIS — E119 Type 2 diabetes mellitus without complications: Secondary | ICD-10-CM | POA: Diagnosis not present

## 2017-10-23 DIAGNOSIS — N2581 Secondary hyperparathyroidism of renal origin: Secondary | ICD-10-CM | POA: Diagnosis not present

## 2017-10-25 DIAGNOSIS — N186 End stage renal disease: Secondary | ICD-10-CM | POA: Diagnosis not present

## 2017-10-25 DIAGNOSIS — N2581 Secondary hyperparathyroidism of renal origin: Secondary | ICD-10-CM | POA: Diagnosis not present

## 2017-10-25 DIAGNOSIS — E119 Type 2 diabetes mellitus without complications: Secondary | ICD-10-CM | POA: Diagnosis not present

## 2017-10-28 DIAGNOSIS — N2581 Secondary hyperparathyroidism of renal origin: Secondary | ICD-10-CM | POA: Diagnosis not present

## 2017-10-28 DIAGNOSIS — E119 Type 2 diabetes mellitus without complications: Secondary | ICD-10-CM | POA: Diagnosis not present

## 2017-10-28 DIAGNOSIS — N186 End stage renal disease: Secondary | ICD-10-CM | POA: Diagnosis not present

## 2017-10-30 DIAGNOSIS — I480 Paroxysmal atrial fibrillation: Secondary | ICD-10-CM | POA: Diagnosis not present

## 2017-10-30 DIAGNOSIS — E119 Type 2 diabetes mellitus without complications: Secondary | ICD-10-CM | POA: Diagnosis not present

## 2017-10-30 DIAGNOSIS — N2581 Secondary hyperparathyroidism of renal origin: Secondary | ICD-10-CM | POA: Diagnosis not present

## 2017-10-30 DIAGNOSIS — N186 End stage renal disease: Secondary | ICD-10-CM | POA: Diagnosis not present

## 2017-11-01 DIAGNOSIS — E119 Type 2 diabetes mellitus without complications: Secondary | ICD-10-CM | POA: Diagnosis not present

## 2017-11-01 DIAGNOSIS — N186 End stage renal disease: Secondary | ICD-10-CM | POA: Diagnosis not present

## 2017-11-01 DIAGNOSIS — N2581 Secondary hyperparathyroidism of renal origin: Secondary | ICD-10-CM | POA: Diagnosis not present

## 2017-11-04 DIAGNOSIS — E119 Type 2 diabetes mellitus without complications: Secondary | ICD-10-CM | POA: Diagnosis not present

## 2017-11-04 DIAGNOSIS — N2581 Secondary hyperparathyroidism of renal origin: Secondary | ICD-10-CM | POA: Diagnosis not present

## 2017-11-04 DIAGNOSIS — N186 End stage renal disease: Secondary | ICD-10-CM | POA: Diagnosis not present

## 2017-11-05 ENCOUNTER — Ambulatory Visit (INDEPENDENT_AMBULATORY_CARE_PROVIDER_SITE_OTHER): Payer: Medicare Other | Admitting: *Deleted

## 2017-11-05 ENCOUNTER — Encounter (INDEPENDENT_AMBULATORY_CARE_PROVIDER_SITE_OTHER): Payer: Self-pay

## 2017-11-05 DIAGNOSIS — Z5181 Encounter for therapeutic drug level monitoring: Secondary | ICD-10-CM | POA: Diagnosis not present

## 2017-11-05 DIAGNOSIS — I480 Paroxysmal atrial fibrillation: Secondary | ICD-10-CM | POA: Diagnosis not present

## 2017-11-05 LAB — POCT INR: INR: 1.5 — AB (ref 2.0–3.0)

## 2017-11-05 NOTE — Patient Instructions (Signed)
Description   Today and tomorrow take 1.5 tablets, then continue taking 1 tablet daily except 1.5 tablets on Thursdays. Recheck INR in 2 weeks.  Please be consistent with your greens intake.  [phone] -coumadin clinic, call with changes in medication or procedures

## 2017-11-06 DIAGNOSIS — N2581 Secondary hyperparathyroidism of renal origin: Secondary | ICD-10-CM | POA: Diagnosis not present

## 2017-11-06 DIAGNOSIS — N186 End stage renal disease: Secondary | ICD-10-CM | POA: Diagnosis not present

## 2017-11-06 DIAGNOSIS — E119 Type 2 diabetes mellitus without complications: Secondary | ICD-10-CM | POA: Diagnosis not present

## 2017-11-08 DIAGNOSIS — E1129 Type 2 diabetes mellitus with other diabetic kidney complication: Secondary | ICD-10-CM | POA: Diagnosis not present

## 2017-11-08 DIAGNOSIS — E119 Type 2 diabetes mellitus without complications: Secondary | ICD-10-CM | POA: Diagnosis not present

## 2017-11-08 DIAGNOSIS — N186 End stage renal disease: Secondary | ICD-10-CM | POA: Diagnosis not present

## 2017-11-08 DIAGNOSIS — N2581 Secondary hyperparathyroidism of renal origin: Secondary | ICD-10-CM | POA: Diagnosis not present

## 2017-11-08 DIAGNOSIS — Z992 Dependence on renal dialysis: Secondary | ICD-10-CM | POA: Diagnosis not present

## 2017-11-11 DIAGNOSIS — N186 End stage renal disease: Secondary | ICD-10-CM | POA: Diagnosis not present

## 2017-11-11 DIAGNOSIS — N2581 Secondary hyperparathyroidism of renal origin: Secondary | ICD-10-CM | POA: Diagnosis not present

## 2017-11-11 DIAGNOSIS — E119 Type 2 diabetes mellitus without complications: Secondary | ICD-10-CM | POA: Diagnosis not present

## 2017-11-12 ENCOUNTER — Other Ambulatory Visit: Payer: Self-pay | Admitting: Neurology

## 2017-11-12 DIAGNOSIS — I482 Chronic atrial fibrillation, unspecified: Secondary | ICD-10-CM

## 2017-11-12 DIAGNOSIS — Z9989 Dependence on other enabling machines and devices: Principal | ICD-10-CM

## 2017-11-12 DIAGNOSIS — G4733 Obstructive sleep apnea (adult) (pediatric): Secondary | ICD-10-CM

## 2017-11-12 DIAGNOSIS — G629 Polyneuropathy, unspecified: Secondary | ICD-10-CM

## 2017-11-12 DIAGNOSIS — G4761 Periodic limb movement disorder: Secondary | ICD-10-CM

## 2017-11-12 DIAGNOSIS — G2581 Restless legs syndrome: Secondary | ICD-10-CM

## 2017-11-13 DIAGNOSIS — N186 End stage renal disease: Secondary | ICD-10-CM | POA: Diagnosis not present

## 2017-11-13 DIAGNOSIS — E119 Type 2 diabetes mellitus without complications: Secondary | ICD-10-CM | POA: Diagnosis not present

## 2017-11-13 DIAGNOSIS — N2581 Secondary hyperparathyroidism of renal origin: Secondary | ICD-10-CM | POA: Diagnosis not present

## 2017-11-15 DIAGNOSIS — N2581 Secondary hyperparathyroidism of renal origin: Secondary | ICD-10-CM | POA: Diagnosis not present

## 2017-11-15 DIAGNOSIS — E119 Type 2 diabetes mellitus without complications: Secondary | ICD-10-CM | POA: Diagnosis not present

## 2017-11-15 DIAGNOSIS — N186 End stage renal disease: Secondary | ICD-10-CM | POA: Diagnosis not present

## 2017-11-18 DIAGNOSIS — N2581 Secondary hyperparathyroidism of renal origin: Secondary | ICD-10-CM | POA: Diagnosis not present

## 2017-11-18 DIAGNOSIS — N186 End stage renal disease: Secondary | ICD-10-CM | POA: Diagnosis not present

## 2017-11-18 DIAGNOSIS — E119 Type 2 diabetes mellitus without complications: Secondary | ICD-10-CM | POA: Diagnosis not present

## 2017-11-19 ENCOUNTER — Ambulatory Visit (INDEPENDENT_AMBULATORY_CARE_PROVIDER_SITE_OTHER): Payer: Medicare Other | Admitting: *Deleted

## 2017-11-19 ENCOUNTER — Encounter (INDEPENDENT_AMBULATORY_CARE_PROVIDER_SITE_OTHER): Payer: Self-pay

## 2017-11-19 DIAGNOSIS — E1129 Type 2 diabetes mellitus with other diabetic kidney complication: Secondary | ICD-10-CM | POA: Diagnosis not present

## 2017-11-19 DIAGNOSIS — I480 Paroxysmal atrial fibrillation: Secondary | ICD-10-CM | POA: Diagnosis not present

## 2017-11-19 DIAGNOSIS — M109 Gout, unspecified: Secondary | ICD-10-CM | POA: Diagnosis not present

## 2017-11-19 DIAGNOSIS — Z5181 Encounter for therapeutic drug level monitoring: Secondary | ICD-10-CM | POA: Diagnosis not present

## 2017-11-19 DIAGNOSIS — Z125 Encounter for screening for malignant neoplasm of prostate: Secondary | ICD-10-CM | POA: Diagnosis not present

## 2017-11-19 DIAGNOSIS — E7849 Other hyperlipidemia: Secondary | ICD-10-CM | POA: Diagnosis not present

## 2017-11-19 DIAGNOSIS — N186 End stage renal disease: Secondary | ICD-10-CM | POA: Diagnosis not present

## 2017-11-19 LAB — POCT INR: INR: 3.7 — AB (ref 2.0–3.0)

## 2017-11-19 NOTE — Patient Instructions (Signed)
Description   Skip today's dose, then start taking 1 tablet everyday.  Recheck INR in 2 weeks.  Please be consistent with your greens intake to 1 serving weekly.  [phone] -coumadin clinic, call with changes in medication or procedures

## 2017-11-20 DIAGNOSIS — N2581 Secondary hyperparathyroidism of renal origin: Secondary | ICD-10-CM | POA: Diagnosis not present

## 2017-11-20 DIAGNOSIS — N186 End stage renal disease: Secondary | ICD-10-CM | POA: Diagnosis not present

## 2017-11-20 DIAGNOSIS — E119 Type 2 diabetes mellitus without complications: Secondary | ICD-10-CM | POA: Diagnosis not present

## 2017-11-22 DIAGNOSIS — E119 Type 2 diabetes mellitus without complications: Secondary | ICD-10-CM | POA: Diagnosis not present

## 2017-11-22 DIAGNOSIS — N186 End stage renal disease: Secondary | ICD-10-CM | POA: Diagnosis not present

## 2017-11-22 DIAGNOSIS — N2581 Secondary hyperparathyroidism of renal origin: Secondary | ICD-10-CM | POA: Diagnosis not present

## 2017-11-25 DIAGNOSIS — E119 Type 2 diabetes mellitus without complications: Secondary | ICD-10-CM | POA: Diagnosis not present

## 2017-11-25 DIAGNOSIS — N186 End stage renal disease: Secondary | ICD-10-CM | POA: Diagnosis not present

## 2017-11-25 DIAGNOSIS — N2581 Secondary hyperparathyroidism of renal origin: Secondary | ICD-10-CM | POA: Diagnosis not present

## 2017-11-26 DIAGNOSIS — M79671 Pain in right foot: Secondary | ICD-10-CM | POA: Diagnosis not present

## 2017-11-26 DIAGNOSIS — I739 Peripheral vascular disease, unspecified: Secondary | ICD-10-CM | POA: Diagnosis not present

## 2017-11-26 DIAGNOSIS — M79672 Pain in left foot: Secondary | ICD-10-CM | POA: Diagnosis not present

## 2017-11-27 DIAGNOSIS — N186 End stage renal disease: Secondary | ICD-10-CM | POA: Diagnosis not present

## 2017-11-27 DIAGNOSIS — E119 Type 2 diabetes mellitus without complications: Secondary | ICD-10-CM | POA: Diagnosis not present

## 2017-11-27 DIAGNOSIS — N2581 Secondary hyperparathyroidism of renal origin: Secondary | ICD-10-CM | POA: Diagnosis not present

## 2017-11-27 DIAGNOSIS — I480 Paroxysmal atrial fibrillation: Secondary | ICD-10-CM | POA: Diagnosis not present

## 2017-11-28 DIAGNOSIS — E1129 Type 2 diabetes mellitus with other diabetic kidney complication: Secondary | ICD-10-CM | POA: Diagnosis not present

## 2017-11-28 DIAGNOSIS — D631 Anemia in chronic kidney disease: Secondary | ICD-10-CM | POA: Diagnosis not present

## 2017-11-28 DIAGNOSIS — Z1389 Encounter for screening for other disorder: Secondary | ICD-10-CM | POA: Diagnosis not present

## 2017-11-28 DIAGNOSIS — Z7901 Long term (current) use of anticoagulants: Secondary | ICD-10-CM | POA: Diagnosis not present

## 2017-11-28 DIAGNOSIS — Z0000 Encounter for general adult medical examination without abnormal findings: Secondary | ICD-10-CM | POA: Diagnosis not present

## 2017-11-28 DIAGNOSIS — N186 End stage renal disease: Secondary | ICD-10-CM | POA: Diagnosis not present

## 2017-11-28 DIAGNOSIS — G4709 Other insomnia: Secondary | ICD-10-CM | POA: Diagnosis not present

## 2017-11-28 DIAGNOSIS — R3914 Feeling of incomplete bladder emptying: Secondary | ICD-10-CM | POA: Diagnosis not present

## 2017-11-28 DIAGNOSIS — E7849 Other hyperlipidemia: Secondary | ICD-10-CM | POA: Diagnosis not present

## 2017-11-28 DIAGNOSIS — I4891 Unspecified atrial fibrillation: Secondary | ICD-10-CM | POA: Diagnosis not present

## 2017-11-28 DIAGNOSIS — E1151 Type 2 diabetes mellitus with diabetic peripheral angiopathy without gangrene: Secondary | ICD-10-CM | POA: Diagnosis not present

## 2017-11-28 DIAGNOSIS — Z6837 Body mass index (BMI) 37.0-37.9, adult: Secondary | ICD-10-CM | POA: Diagnosis not present

## 2017-11-29 ENCOUNTER — Telehealth (HOSPITAL_COMMUNITY): Payer: Self-pay | Admitting: *Deleted

## 2017-11-29 DIAGNOSIS — N2581 Secondary hyperparathyroidism of renal origin: Secondary | ICD-10-CM | POA: Diagnosis not present

## 2017-11-29 DIAGNOSIS — N186 End stage renal disease: Secondary | ICD-10-CM | POA: Diagnosis not present

## 2017-11-29 DIAGNOSIS — E119 Type 2 diabetes mellitus without complications: Secondary | ICD-10-CM | POA: Diagnosis not present

## 2017-12-02 ENCOUNTER — Other Ambulatory Visit (HOSPITAL_COMMUNITY): Payer: Self-pay | Admitting: Internal Medicine

## 2017-12-02 DIAGNOSIS — N186 End stage renal disease: Secondary | ICD-10-CM | POA: Diagnosis not present

## 2017-12-02 DIAGNOSIS — E119 Type 2 diabetes mellitus without complications: Secondary | ICD-10-CM | POA: Diagnosis not present

## 2017-12-02 DIAGNOSIS — R209 Unspecified disturbances of skin sensation: Secondary | ICD-10-CM

## 2017-12-02 DIAGNOSIS — N2581 Secondary hyperparathyroidism of renal origin: Secondary | ICD-10-CM | POA: Diagnosis not present

## 2017-12-03 ENCOUNTER — Ambulatory Visit (HOSPITAL_COMMUNITY)
Admission: RE | Admit: 2017-12-03 | Discharge: 2017-12-03 | Disposition: A | Discharge status: Home / Self Care | Payer: Medicare Other | Source: Ambulatory Visit | Attending: Family | Admitting: Family

## 2017-12-03 ENCOUNTER — Ambulatory Visit (INDEPENDENT_AMBULATORY_CARE_PROVIDER_SITE_OTHER): Payer: Medicare Other | Admitting: Pharmacist

## 2017-12-03 DIAGNOSIS — Z5181 Encounter for therapeutic drug level monitoring: Secondary | ICD-10-CM

## 2017-12-03 DIAGNOSIS — R209 Unspecified disturbances of skin sensation: Secondary | ICD-10-CM | POA: Diagnosis not present

## 2017-12-03 DIAGNOSIS — I480 Paroxysmal atrial fibrillation: Secondary | ICD-10-CM

## 2017-12-03 LAB — POCT INR: INR: 3.3 — AB (ref 2.0–3.0)

## 2017-12-03 NOTE — Patient Instructions (Addendum)
Description   Skip today's dose, then change dose to 1 tablet every day except for 1/2 tablet on wednesdays.  Please be consistent with your greens intake to 1 serving weekly.  [phone] -coumadin clinic, call with changes in medication or procedures

## 2017-12-04 DIAGNOSIS — E119 Type 2 diabetes mellitus without complications: Secondary | ICD-10-CM | POA: Diagnosis not present

## 2017-12-04 DIAGNOSIS — N186 End stage renal disease: Secondary | ICD-10-CM | POA: Diagnosis not present

## 2017-12-04 DIAGNOSIS — N2581 Secondary hyperparathyroidism of renal origin: Secondary | ICD-10-CM | POA: Diagnosis not present

## 2017-12-06 DIAGNOSIS — E119 Type 2 diabetes mellitus without complications: Secondary | ICD-10-CM | POA: Diagnosis not present

## 2017-12-06 DIAGNOSIS — N186 End stage renal disease: Secondary | ICD-10-CM | POA: Diagnosis not present

## 2017-12-06 DIAGNOSIS — N2581 Secondary hyperparathyroidism of renal origin: Secondary | ICD-10-CM | POA: Diagnosis not present

## 2017-12-08 DIAGNOSIS — Z992 Dependence on renal dialysis: Secondary | ICD-10-CM | POA: Diagnosis not present

## 2017-12-08 DIAGNOSIS — N186 End stage renal disease: Secondary | ICD-10-CM | POA: Diagnosis not present

## 2017-12-08 DIAGNOSIS — E1129 Type 2 diabetes mellitus with other diabetic kidney complication: Secondary | ICD-10-CM | POA: Diagnosis not present

## 2017-12-09 DIAGNOSIS — E119 Type 2 diabetes mellitus without complications: Secondary | ICD-10-CM | POA: Diagnosis not present

## 2017-12-09 DIAGNOSIS — N186 End stage renal disease: Secondary | ICD-10-CM | POA: Diagnosis not present

## 2017-12-09 DIAGNOSIS — N2581 Secondary hyperparathyroidism of renal origin: Secondary | ICD-10-CM | POA: Diagnosis not present

## 2017-12-11 DIAGNOSIS — E119 Type 2 diabetes mellitus without complications: Secondary | ICD-10-CM | POA: Diagnosis not present

## 2017-12-11 DIAGNOSIS — N186 End stage renal disease: Secondary | ICD-10-CM | POA: Diagnosis not present

## 2017-12-11 DIAGNOSIS — N2581 Secondary hyperparathyroidism of renal origin: Secondary | ICD-10-CM | POA: Diagnosis not present

## 2017-12-13 DIAGNOSIS — N186 End stage renal disease: Secondary | ICD-10-CM | POA: Diagnosis not present

## 2017-12-13 DIAGNOSIS — E119 Type 2 diabetes mellitus without complications: Secondary | ICD-10-CM | POA: Diagnosis not present

## 2017-12-13 DIAGNOSIS — N2581 Secondary hyperparathyroidism of renal origin: Secondary | ICD-10-CM | POA: Diagnosis not present

## 2017-12-16 DIAGNOSIS — N2581 Secondary hyperparathyroidism of renal origin: Secondary | ICD-10-CM | POA: Diagnosis not present

## 2017-12-16 DIAGNOSIS — N186 End stage renal disease: Secondary | ICD-10-CM | POA: Diagnosis not present

## 2017-12-16 DIAGNOSIS — E119 Type 2 diabetes mellitus without complications: Secondary | ICD-10-CM | POA: Diagnosis not present

## 2017-12-17 ENCOUNTER — Ambulatory Visit (INDEPENDENT_AMBULATORY_CARE_PROVIDER_SITE_OTHER): Payer: Medicare Other | Admitting: *Deleted

## 2017-12-17 DIAGNOSIS — Z5181 Encounter for therapeutic drug level monitoring: Secondary | ICD-10-CM

## 2017-12-17 DIAGNOSIS — I480 Paroxysmal atrial fibrillation: Secondary | ICD-10-CM

## 2017-12-17 LAB — POCT INR: INR: 2.8 (ref 2.0–3.0)

## 2017-12-17 NOTE — Patient Instructions (Addendum)
Description   Continue taking 1 tablet every day except for 1/2 tablet on Wednesdays. Recheck INR in 3 weeks. Please be consistent with your greens intake to 1 serving weekly. [facility], call with changes in medication or procedures

## 2017-12-18 DIAGNOSIS — N186 End stage renal disease: Secondary | ICD-10-CM | POA: Diagnosis not present

## 2017-12-18 DIAGNOSIS — N2581 Secondary hyperparathyroidism of renal origin: Secondary | ICD-10-CM | POA: Diagnosis not present

## 2017-12-18 DIAGNOSIS — E119 Type 2 diabetes mellitus without complications: Secondary | ICD-10-CM | POA: Diagnosis not present

## 2017-12-20 DIAGNOSIS — N2581 Secondary hyperparathyroidism of renal origin: Secondary | ICD-10-CM | POA: Diagnosis not present

## 2017-12-20 DIAGNOSIS — N186 End stage renal disease: Secondary | ICD-10-CM | POA: Diagnosis not present

## 2017-12-20 DIAGNOSIS — E119 Type 2 diabetes mellitus without complications: Secondary | ICD-10-CM | POA: Diagnosis not present

## 2017-12-23 DIAGNOSIS — N2581 Secondary hyperparathyroidism of renal origin: Secondary | ICD-10-CM | POA: Diagnosis not present

## 2017-12-23 DIAGNOSIS — E119 Type 2 diabetes mellitus without complications: Secondary | ICD-10-CM | POA: Diagnosis not present

## 2017-12-23 DIAGNOSIS — N186 End stage renal disease: Secondary | ICD-10-CM | POA: Diagnosis not present

## 2017-12-25 DIAGNOSIS — N186 End stage renal disease: Secondary | ICD-10-CM | POA: Diagnosis not present

## 2017-12-25 DIAGNOSIS — N2581 Secondary hyperparathyroidism of renal origin: Secondary | ICD-10-CM | POA: Diagnosis not present

## 2017-12-25 DIAGNOSIS — E119 Type 2 diabetes mellitus without complications: Secondary | ICD-10-CM | POA: Diagnosis not present

## 2017-12-27 DIAGNOSIS — E119 Type 2 diabetes mellitus without complications: Secondary | ICD-10-CM | POA: Diagnosis not present

## 2017-12-27 DIAGNOSIS — N186 End stage renal disease: Secondary | ICD-10-CM | POA: Diagnosis not present

## 2017-12-27 DIAGNOSIS — N2581 Secondary hyperparathyroidism of renal origin: Secondary | ICD-10-CM | POA: Diagnosis not present

## 2017-12-29 DIAGNOSIS — N186 End stage renal disease: Secondary | ICD-10-CM | POA: Diagnosis not present

## 2017-12-29 DIAGNOSIS — N2581 Secondary hyperparathyroidism of renal origin: Secondary | ICD-10-CM | POA: Diagnosis not present

## 2017-12-29 DIAGNOSIS — E119 Type 2 diabetes mellitus without complications: Secondary | ICD-10-CM | POA: Diagnosis not present

## 2017-12-31 DIAGNOSIS — N2581 Secondary hyperparathyroidism of renal origin: Secondary | ICD-10-CM | POA: Diagnosis not present

## 2017-12-31 DIAGNOSIS — N186 End stage renal disease: Secondary | ICD-10-CM | POA: Diagnosis not present

## 2017-12-31 DIAGNOSIS — E119 Type 2 diabetes mellitus without complications: Secondary | ICD-10-CM | POA: Diagnosis not present

## 2018-01-03 DIAGNOSIS — N186 End stage renal disease: Secondary | ICD-10-CM | POA: Diagnosis not present

## 2018-01-03 DIAGNOSIS — E119 Type 2 diabetes mellitus without complications: Secondary | ICD-10-CM | POA: Diagnosis not present

## 2018-01-03 DIAGNOSIS — N2581 Secondary hyperparathyroidism of renal origin: Secondary | ICD-10-CM | POA: Diagnosis not present

## 2018-01-05 DIAGNOSIS — N186 End stage renal disease: Secondary | ICD-10-CM | POA: Diagnosis not present

## 2018-01-05 DIAGNOSIS — N2581 Secondary hyperparathyroidism of renal origin: Secondary | ICD-10-CM | POA: Diagnosis not present

## 2018-01-05 DIAGNOSIS — E119 Type 2 diabetes mellitus without complications: Secondary | ICD-10-CM | POA: Diagnosis not present

## 2018-01-06 ENCOUNTER — Ambulatory Visit (INDEPENDENT_AMBULATORY_CARE_PROVIDER_SITE_OTHER): Payer: Medicare Other | Admitting: *Deleted

## 2018-01-06 DIAGNOSIS — Z5181 Encounter for therapeutic drug level monitoring: Secondary | ICD-10-CM | POA: Diagnosis not present

## 2018-01-06 DIAGNOSIS — I480 Paroxysmal atrial fibrillation: Secondary | ICD-10-CM | POA: Diagnosis not present

## 2018-01-06 LAB — POCT INR: INR: 2.9 (ref 2.0–3.0)

## 2018-01-06 NOTE — Patient Instructions (Signed)
Description   Continue taking 1 tablet every day except for 1/2 tablet on Wednesdays. Recheck INR in 4 weeks. Please be consistent with your greens intake to 1 serving weekly. [facility], call with changes in medication or procedures

## 2018-01-07 DIAGNOSIS — E119 Type 2 diabetes mellitus without complications: Secondary | ICD-10-CM | POA: Diagnosis not present

## 2018-01-07 DIAGNOSIS — N186 End stage renal disease: Secondary | ICD-10-CM | POA: Diagnosis not present

## 2018-01-07 DIAGNOSIS — N2581 Secondary hyperparathyroidism of renal origin: Secondary | ICD-10-CM | POA: Diagnosis not present

## 2018-01-08 DIAGNOSIS — N186 End stage renal disease: Secondary | ICD-10-CM | POA: Diagnosis not present

## 2018-01-08 DIAGNOSIS — Z992 Dependence on renal dialysis: Secondary | ICD-10-CM | POA: Diagnosis not present

## 2018-01-08 DIAGNOSIS — E1129 Type 2 diabetes mellitus with other diabetic kidney complication: Secondary | ICD-10-CM | POA: Diagnosis not present

## 2018-01-10 DIAGNOSIS — N2581 Secondary hyperparathyroidism of renal origin: Secondary | ICD-10-CM | POA: Diagnosis not present

## 2018-01-10 DIAGNOSIS — N186 End stage renal disease: Secondary | ICD-10-CM | POA: Diagnosis not present

## 2018-01-10 DIAGNOSIS — E119 Type 2 diabetes mellitus without complications: Secondary | ICD-10-CM | POA: Diagnosis not present

## 2018-01-13 ENCOUNTER — Other Ambulatory Visit: Payer: Self-pay | Admitting: Neurology

## 2018-01-13 DIAGNOSIS — G4761 Periodic limb movement disorder: Secondary | ICD-10-CM

## 2018-01-13 DIAGNOSIS — G2581 Restless legs syndrome: Secondary | ICD-10-CM

## 2018-01-13 DIAGNOSIS — N2581 Secondary hyperparathyroidism of renal origin: Secondary | ICD-10-CM | POA: Diagnosis not present

## 2018-01-13 DIAGNOSIS — G629 Polyneuropathy, unspecified: Secondary | ICD-10-CM

## 2018-01-13 DIAGNOSIS — Z9989 Dependence on other enabling machines and devices: Principal | ICD-10-CM

## 2018-01-13 DIAGNOSIS — E119 Type 2 diabetes mellitus without complications: Secondary | ICD-10-CM | POA: Diagnosis not present

## 2018-01-13 DIAGNOSIS — G4733 Obstructive sleep apnea (adult) (pediatric): Secondary | ICD-10-CM

## 2018-01-13 DIAGNOSIS — I482 Chronic atrial fibrillation, unspecified: Secondary | ICD-10-CM

## 2018-01-13 DIAGNOSIS — N186 End stage renal disease: Secondary | ICD-10-CM | POA: Diagnosis not present

## 2018-01-15 DIAGNOSIS — N2581 Secondary hyperparathyroidism of renal origin: Secondary | ICD-10-CM | POA: Diagnosis not present

## 2018-01-15 DIAGNOSIS — N186 End stage renal disease: Secondary | ICD-10-CM | POA: Diagnosis not present

## 2018-01-15 DIAGNOSIS — E119 Type 2 diabetes mellitus without complications: Secondary | ICD-10-CM | POA: Diagnosis not present

## 2018-01-17 DIAGNOSIS — N2581 Secondary hyperparathyroidism of renal origin: Secondary | ICD-10-CM | POA: Diagnosis not present

## 2018-01-17 DIAGNOSIS — N186 End stage renal disease: Secondary | ICD-10-CM | POA: Diagnosis not present

## 2018-01-17 DIAGNOSIS — E119 Type 2 diabetes mellitus without complications: Secondary | ICD-10-CM | POA: Diagnosis not present

## 2018-01-20 DIAGNOSIS — N186 End stage renal disease: Secondary | ICD-10-CM | POA: Diagnosis not present

## 2018-01-20 DIAGNOSIS — E119 Type 2 diabetes mellitus without complications: Secondary | ICD-10-CM | POA: Diagnosis not present

## 2018-01-20 DIAGNOSIS — N2581 Secondary hyperparathyroidism of renal origin: Secondary | ICD-10-CM | POA: Diagnosis not present

## 2018-01-22 DIAGNOSIS — N2581 Secondary hyperparathyroidism of renal origin: Secondary | ICD-10-CM | POA: Diagnosis not present

## 2018-01-22 DIAGNOSIS — E119 Type 2 diabetes mellitus without complications: Secondary | ICD-10-CM | POA: Diagnosis not present

## 2018-01-22 DIAGNOSIS — N186 End stage renal disease: Secondary | ICD-10-CM | POA: Diagnosis not present

## 2018-01-24 DIAGNOSIS — N186 End stage renal disease: Secondary | ICD-10-CM | POA: Diagnosis not present

## 2018-01-24 DIAGNOSIS — N2581 Secondary hyperparathyroidism of renal origin: Secondary | ICD-10-CM | POA: Diagnosis not present

## 2018-01-24 DIAGNOSIS — E119 Type 2 diabetes mellitus without complications: Secondary | ICD-10-CM | POA: Diagnosis not present

## 2018-01-27 DIAGNOSIS — N186 End stage renal disease: Secondary | ICD-10-CM | POA: Diagnosis not present

## 2018-01-27 DIAGNOSIS — N2581 Secondary hyperparathyroidism of renal origin: Secondary | ICD-10-CM | POA: Diagnosis not present

## 2018-01-27 DIAGNOSIS — E119 Type 2 diabetes mellitus without complications: Secondary | ICD-10-CM | POA: Diagnosis not present

## 2018-01-28 DIAGNOSIS — I739 Peripheral vascular disease, unspecified: Secondary | ICD-10-CM | POA: Diagnosis not present

## 2018-01-28 DIAGNOSIS — M2041 Other hammer toe(s) (acquired), right foot: Secondary | ICD-10-CM | POA: Diagnosis not present

## 2018-01-28 DIAGNOSIS — M79672 Pain in left foot: Secondary | ICD-10-CM | POA: Diagnosis not present

## 2018-01-28 DIAGNOSIS — M79671 Pain in right foot: Secondary | ICD-10-CM | POA: Diagnosis not present

## 2018-01-28 DIAGNOSIS — M21611 Bunion of right foot: Secondary | ICD-10-CM | POA: Diagnosis not present

## 2018-01-28 DIAGNOSIS — M21612 Bunion of left foot: Secondary | ICD-10-CM | POA: Diagnosis not present

## 2018-01-28 DIAGNOSIS — M2042 Other hammer toe(s) (acquired), left foot: Secondary | ICD-10-CM | POA: Diagnosis not present

## 2018-01-28 DIAGNOSIS — B351 Tinea unguium: Secondary | ICD-10-CM | POA: Diagnosis not present

## 2018-01-29 DIAGNOSIS — N186 End stage renal disease: Secondary | ICD-10-CM | POA: Diagnosis not present

## 2018-01-29 DIAGNOSIS — E119 Type 2 diabetes mellitus without complications: Secondary | ICD-10-CM | POA: Diagnosis not present

## 2018-01-29 DIAGNOSIS — N2581 Secondary hyperparathyroidism of renal origin: Secondary | ICD-10-CM | POA: Diagnosis not present

## 2018-01-30 ENCOUNTER — Other Ambulatory Visit: Payer: Self-pay | Admitting: *Deleted

## 2018-01-30 MED ORDER — WARFARIN SODIUM 5 MG PO TABS: 5.0000 mg | ORAL_TABLET | Freq: Every day | ORAL | 0 refills | Status: DC

## 2018-01-31 DIAGNOSIS — N186 End stage renal disease: Secondary | ICD-10-CM | POA: Diagnosis not present

## 2018-01-31 DIAGNOSIS — E119 Type 2 diabetes mellitus without complications: Secondary | ICD-10-CM | POA: Diagnosis not present

## 2018-01-31 DIAGNOSIS — N2581 Secondary hyperparathyroidism of renal origin: Secondary | ICD-10-CM | POA: Diagnosis not present

## 2018-02-03 DIAGNOSIS — N186 End stage renal disease: Secondary | ICD-10-CM | POA: Diagnosis not present

## 2018-02-03 DIAGNOSIS — N2581 Secondary hyperparathyroidism of renal origin: Secondary | ICD-10-CM | POA: Diagnosis not present

## 2018-02-04 ENCOUNTER — Ambulatory Visit (INDEPENDENT_AMBULATORY_CARE_PROVIDER_SITE_OTHER): Payer: Medicare Other | Admitting: *Deleted

## 2018-02-04 DIAGNOSIS — I480 Paroxysmal atrial fibrillation: Secondary | ICD-10-CM

## 2018-02-04 DIAGNOSIS — Z5181 Encounter for therapeutic drug level monitoring: Secondary | ICD-10-CM | POA: Diagnosis not present

## 2018-02-04 LAB — POCT INR: INR: 2.6 (ref 2.0–3.0)

## 2018-02-04 NOTE — Patient Instructions (Signed)
Description   Continue taking 1 tablet every day except for 1/2 tablet on Wednesdays. Recheck INR in 5 weeks. Please be consistent with your greens intake to 1 serving weekly. [facility], call with changes in medication or procedures

## 2018-02-06 DIAGNOSIS — N186 End stage renal disease: Secondary | ICD-10-CM | POA: Diagnosis not present

## 2018-02-06 DIAGNOSIS — N2581 Secondary hyperparathyroidism of renal origin: Secondary | ICD-10-CM | POA: Diagnosis not present

## 2018-02-07 DIAGNOSIS — N186 End stage renal disease: Secondary | ICD-10-CM | POA: Diagnosis not present

## 2018-02-07 DIAGNOSIS — N2581 Secondary hyperparathyroidism of renal origin: Secondary | ICD-10-CM | POA: Diagnosis not present

## 2018-02-10 DIAGNOSIS — N186 End stage renal disease: Secondary | ICD-10-CM | POA: Diagnosis not present

## 2018-02-10 DIAGNOSIS — N2581 Secondary hyperparathyroidism of renal origin: Secondary | ICD-10-CM | POA: Diagnosis not present

## 2018-02-12 DIAGNOSIS — N186 End stage renal disease: Secondary | ICD-10-CM | POA: Diagnosis not present

## 2018-02-12 DIAGNOSIS — N2581 Secondary hyperparathyroidism of renal origin: Secondary | ICD-10-CM | POA: Diagnosis not present

## 2018-02-14 DIAGNOSIS — N2581 Secondary hyperparathyroidism of renal origin: Secondary | ICD-10-CM | POA: Diagnosis not present

## 2018-02-14 DIAGNOSIS — N186 End stage renal disease: Secondary | ICD-10-CM | POA: Diagnosis not present

## 2018-02-17 ENCOUNTER — Other Ambulatory Visit: Payer: Self-pay | Admitting: Neurology

## 2018-02-17 DIAGNOSIS — G2581 Restless legs syndrome: Secondary | ICD-10-CM

## 2018-02-17 DIAGNOSIS — Z9989 Dependence on other enabling machines and devices: Principal | ICD-10-CM

## 2018-02-17 DIAGNOSIS — N186 End stage renal disease: Secondary | ICD-10-CM | POA: Diagnosis not present

## 2018-02-17 DIAGNOSIS — G629 Polyneuropathy, unspecified: Secondary | ICD-10-CM

## 2018-02-17 DIAGNOSIS — I482 Chronic atrial fibrillation, unspecified: Secondary | ICD-10-CM

## 2018-02-17 DIAGNOSIS — N2581 Secondary hyperparathyroidism of renal origin: Secondary | ICD-10-CM | POA: Diagnosis not present

## 2018-02-17 DIAGNOSIS — G4733 Obstructive sleep apnea (adult) (pediatric): Secondary | ICD-10-CM

## 2018-02-17 DIAGNOSIS — G4761 Periodic limb movement disorder: Secondary | ICD-10-CM

## 2018-02-19 DIAGNOSIS — N2581 Secondary hyperparathyroidism of renal origin: Secondary | ICD-10-CM | POA: Diagnosis not present

## 2018-02-19 DIAGNOSIS — N186 End stage renal disease: Secondary | ICD-10-CM | POA: Diagnosis not present

## 2018-02-20 ENCOUNTER — Ambulatory Visit (INDEPENDENT_AMBULATORY_CARE_PROVIDER_SITE_OTHER): Payer: Medicare Other | Admitting: Internal Medicine

## 2018-02-20 ENCOUNTER — Encounter: Payer: Self-pay | Admitting: Internal Medicine

## 2018-02-20 VITALS — BP 114/76 | HR 78 | Ht 73.0 in | Wt 276.1 lb

## 2018-02-20 DIAGNOSIS — N186 End stage renal disease: Secondary | ICD-10-CM

## 2018-02-20 DIAGNOSIS — Z992 Dependence on renal dialysis: Secondary | ICD-10-CM | POA: Diagnosis not present

## 2018-02-20 DIAGNOSIS — E785 Hyperlipidemia, unspecified: Secondary | ICD-10-CM

## 2018-02-20 DIAGNOSIS — I480 Paroxysmal atrial fibrillation: Secondary | ICD-10-CM

## 2018-02-20 NOTE — Patient Instructions (Signed)
Medication Instructions:  No change If you need a refill on your cardiac medications before your next appointment, please call your pharmacy.   Lab work: none If you have labs (blood work) drawn today and your tests are completely normal, you will receive your results only by: Marland Kitchen MyChart Message (if you have MyChart) OR . A paper copy in the mail If you have any lab test that is abnormal or we need to change your treatment, we will call you to review the results.  Testing/Procedures: none  Follow-Up: At [facility], you and your health needs are our priority.  As part of our continuing mission to provide you with exceptional heart care, we have created designated Provider Care Teams.  These Care Teams include your primary Cardiologist (physician) and Advanced Practice Providers (APPs -  Physician Assistants and Nurse Practitioners) who all work together to provide you with the care you need, when you need it. You will need a follow up appointment in:  December - 10 months.  Please call our office 2 months in advance to schedule this appointment.  You may see Dorris Carnes, MD or one of the following Advanced Practice Providers on your designated Care Team: Richardson Dopp, PA-C [place], [place] . Daune Perch, NP  Any Other Special Instructions Will Be Listed Below (If Applicable).
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
## 2018-02-21 DIAGNOSIS — N186 End stage renal disease: Secondary | ICD-10-CM | POA: Diagnosis not present

## 2018-02-21 DIAGNOSIS — N2581 Secondary hyperparathyroidism of renal origin: Secondary | ICD-10-CM | POA: Diagnosis not present

## 2018-02-24 DIAGNOSIS — N186 End stage renal disease: Secondary | ICD-10-CM | POA: Diagnosis not present

## 2018-02-24 DIAGNOSIS — N2581 Secondary hyperparathyroidism of renal origin: Secondary | ICD-10-CM | POA: Diagnosis not present

## 2018-02-26 DIAGNOSIS — N186 End stage renal disease: Secondary | ICD-10-CM | POA: Diagnosis not present

## 2018-02-26 DIAGNOSIS — N2581 Secondary hyperparathyroidism of renal origin: Secondary | ICD-10-CM | POA: Diagnosis not present

## 2018-02-28 DIAGNOSIS — N186 End stage renal disease: Secondary | ICD-10-CM | POA: Diagnosis not present

## 2018-02-28 DIAGNOSIS — N2581 Secondary hyperparathyroidism of renal origin: Secondary | ICD-10-CM | POA: Diagnosis not present

## 2018-03-03 DIAGNOSIS — N2581 Secondary hyperparathyroidism of renal origin: Secondary | ICD-10-CM | POA: Diagnosis not present

## 2018-03-03 DIAGNOSIS — N186 End stage renal disease: Secondary | ICD-10-CM | POA: Diagnosis not present

## 2018-03-05 DIAGNOSIS — N2581 Secondary hyperparathyroidism of renal origin: Secondary | ICD-10-CM | POA: Diagnosis not present

## 2018-03-05 DIAGNOSIS — N186 End stage renal disease: Secondary | ICD-10-CM | POA: Diagnosis not present

## 2018-03-07 DIAGNOSIS — N2581 Secondary hyperparathyroidism of renal origin: Secondary | ICD-10-CM | POA: Diagnosis not present

## 2018-03-07 DIAGNOSIS — N186 End stage renal disease: Secondary | ICD-10-CM | POA: Diagnosis not present

## 2018-03-08 DIAGNOSIS — Z992 Dependence on renal dialysis: Secondary | ICD-10-CM | POA: Diagnosis not present

## 2018-03-08 DIAGNOSIS — E1129 Type 2 diabetes mellitus with other diabetic kidney complication: Secondary | ICD-10-CM | POA: Diagnosis not present

## 2018-03-08 DIAGNOSIS — N186 End stage renal disease: Secondary | ICD-10-CM | POA: Diagnosis not present

## 2018-03-10 DIAGNOSIS — N186 End stage renal disease: Secondary | ICD-10-CM | POA: Diagnosis not present

## 2018-03-10 DIAGNOSIS — N2581 Secondary hyperparathyroidism of renal origin: Secondary | ICD-10-CM | POA: Diagnosis not present

## 2018-03-11 ENCOUNTER — Ambulatory Visit (INDEPENDENT_AMBULATORY_CARE_PROVIDER_SITE_OTHER): Payer: Medicare Other | Admitting: Pharmacist

## 2018-03-11 DIAGNOSIS — Z5181 Encounter for therapeutic drug level monitoring: Secondary | ICD-10-CM

## 2018-03-11 DIAGNOSIS — I480 Paroxysmal atrial fibrillation: Secondary | ICD-10-CM | POA: Diagnosis not present

## 2018-03-11 LAB — POCT INR: INR: 2.3 (ref 2.0–3.0)

## 2018-03-11 NOTE — Patient Instructions (Signed)
Description   Continue taking 1 tablet every day except for 1/2 tablet on Wednesdays. Recheck INR in 6 weeks. Please be consistent with your greens intake 1 serving weekly. [facility], call with changes in medication or procedures

## 2018-03-12 DIAGNOSIS — N186 End stage renal disease: Secondary | ICD-10-CM | POA: Diagnosis not present

## 2018-03-12 DIAGNOSIS — N2581 Secondary hyperparathyroidism of renal origin: Secondary | ICD-10-CM | POA: Diagnosis not present

## 2018-03-14 DIAGNOSIS — N2581 Secondary hyperparathyroidism of renal origin: Secondary | ICD-10-CM | POA: Diagnosis not present

## 2018-03-14 DIAGNOSIS — N186 End stage renal disease: Secondary | ICD-10-CM | POA: Diagnosis not present

## 2018-03-17 DIAGNOSIS — N2581 Secondary hyperparathyroidism of renal origin: Secondary | ICD-10-CM | POA: Diagnosis not present

## 2018-03-17 DIAGNOSIS — N186 End stage renal disease: Secondary | ICD-10-CM | POA: Diagnosis not present

## 2018-03-19 DIAGNOSIS — N186 End stage renal disease: Secondary | ICD-10-CM | POA: Diagnosis not present

## 2018-03-19 DIAGNOSIS — N2581 Secondary hyperparathyroidism of renal origin: Secondary | ICD-10-CM | POA: Diagnosis not present

## 2018-03-21 DIAGNOSIS — N2581 Secondary hyperparathyroidism of renal origin: Secondary | ICD-10-CM | POA: Diagnosis not present

## 2018-03-21 DIAGNOSIS — N186 End stage renal disease: Secondary | ICD-10-CM | POA: Diagnosis not present

## 2018-03-24 ENCOUNTER — Other Ambulatory Visit: Payer: Self-pay | Admitting: Neurology

## 2018-03-24 DIAGNOSIS — N186 End stage renal disease: Secondary | ICD-10-CM | POA: Diagnosis not present

## 2018-03-24 DIAGNOSIS — G2581 Restless legs syndrome: Secondary | ICD-10-CM

## 2018-03-24 DIAGNOSIS — N2581 Secondary hyperparathyroidism of renal origin: Secondary | ICD-10-CM | POA: Diagnosis not present

## 2018-03-24 DIAGNOSIS — I482 Chronic atrial fibrillation, unspecified: Secondary | ICD-10-CM

## 2018-03-24 DIAGNOSIS — Z9989 Dependence on other enabling machines and devices: Principal | ICD-10-CM

## 2018-03-24 DIAGNOSIS — G4761 Periodic limb movement disorder: Secondary | ICD-10-CM

## 2018-03-24 DIAGNOSIS — G4733 Obstructive sleep apnea (adult) (pediatric): Secondary | ICD-10-CM

## 2018-03-24 DIAGNOSIS — G629 Polyneuropathy, unspecified: Secondary | ICD-10-CM

## 2018-03-26 DIAGNOSIS — N2581 Secondary hyperparathyroidism of renal origin: Secondary | ICD-10-CM | POA: Diagnosis not present

## 2018-03-26 DIAGNOSIS — N186 End stage renal disease: Secondary | ICD-10-CM | POA: Diagnosis not present

## 2018-03-28 DIAGNOSIS — N2581 Secondary hyperparathyroidism of renal origin: Secondary | ICD-10-CM | POA: Diagnosis not present

## 2018-03-28 DIAGNOSIS — N186 End stage renal disease: Secondary | ICD-10-CM | POA: Diagnosis not present

## 2018-03-31 DIAGNOSIS — N186 End stage renal disease: Secondary | ICD-10-CM | POA: Diagnosis not present

## 2018-03-31 DIAGNOSIS — N2581 Secondary hyperparathyroidism of renal origin: Secondary | ICD-10-CM | POA: Diagnosis not present

## 2018-04-02 DIAGNOSIS — N186 End stage renal disease: Secondary | ICD-10-CM | POA: Diagnosis not present

## 2018-04-02 DIAGNOSIS — N2581 Secondary hyperparathyroidism of renal origin: Secondary | ICD-10-CM | POA: Diagnosis not present

## 2018-04-03 ENCOUNTER — Ambulatory Visit: Payer: Medicare Other | Admitting: Family

## 2018-04-03 ENCOUNTER — Encounter (HOSPITAL_COMMUNITY): Payer: Medicare Other

## 2018-04-04 ENCOUNTER — Encounter: Payer: Self-pay | Admitting: *Deleted

## 2018-04-04 ENCOUNTER — Other Ambulatory Visit: Payer: Self-pay | Admitting: *Deleted

## 2018-04-04 ENCOUNTER — Telehealth: Payer: Self-pay | Admitting: *Deleted

## 2018-04-04 DIAGNOSIS — N186 End stage renal disease: Secondary | ICD-10-CM | POA: Diagnosis not present

## 2018-04-04 DIAGNOSIS — N2581 Secondary hyperparathyroidism of renal origin: Secondary | ICD-10-CM | POA: Diagnosis not present

## 2018-04-04 NOTE — Telephone Encounter (Signed)
Patient with diagnosis of atrial fibrillation on warfarin for anticoagulation.    Procedure: fistulogram Date of procedure: 04/10/2018  CHADS2-VASc score of  2 (, HTN,, DM2, )  CrCl 15.8 Platelet count 244  Per office protocol, patient can hold warfarin for 3 days prior to procedure.    Patient will not need bridging with Lovenox (enoxaparin) around procedure.

## 2018-04-04 NOTE — Telephone Encounter (Signed)
Request for Surgical Clearance  1. What type of surgery is being performed?  FISTULOGRAM    2. When is this surgery scheduled? 04/10/2018  3. What type of clearance is required (medical clearance vs. Pharmacy clearance to hold med vs. Both)? PHARMACY CLEARANCE     4. Are there any medications that need to be held prior to surgery and how long?  COUMADIN HOLD 3 DAYS PRIOR TO PROCEDURE     5. Practice name and name of physician performing surgery?   DR. Trula Slade [facility]    6.  What is your office phone number? [phone]    7. What is your office fax number? (Be sure to include anyone who it needs to go Attn to) [place]    8. Anesthesia type (None, local, MAC, general)?IV SEDATION    REMINDER TO USER: Remember to please route this message to P [facility] PREOP in a phone note.

## 2018-04-04 NOTE — Progress Notes (Signed)
Poor access flows with hemodialysis. Requested evaluation. Schedule for Fistulogram  On non-dialysis day per Dr. Oneida Alar. No office appointment prior.

## 2018-04-04 NOTE — Telephone Encounter (Signed)
Spoke with patient on the phone and chose 04/10/2018 for procedure. Instruction letter faxed to his [facility] in [facility] and he is to call this office if any questions.

## 2018-04-07 ENCOUNTER — Telehealth: Payer: Self-pay | Admitting: *Deleted

## 2018-04-07 DIAGNOSIS — N186 End stage renal disease: Secondary | ICD-10-CM | POA: Diagnosis not present

## 2018-04-07 DIAGNOSIS — N2581 Secondary hyperparathyroidism of renal origin: Secondary | ICD-10-CM | POA: Diagnosis not present

## 2018-04-07 NOTE — Telephone Encounter (Signed)
Via phone call. Reviewed all pre-procedure instructions with patient's wife. Verbalized understanding.

## 2018-04-08 ENCOUNTER — Ambulatory Visit: Payer: Medicare Other | Admitting: Family

## 2018-04-08 ENCOUNTER — Encounter (HOSPITAL_COMMUNITY): Payer: Medicare Other

## 2018-04-08 DIAGNOSIS — N186 End stage renal disease: Secondary | ICD-10-CM | POA: Diagnosis not present

## 2018-04-08 DIAGNOSIS — Z992 Dependence on renal dialysis: Secondary | ICD-10-CM | POA: Diagnosis not present

## 2018-04-08 DIAGNOSIS — E1129 Type 2 diabetes mellitus with other diabetic kidney complication: Secondary | ICD-10-CM | POA: Diagnosis not present

## 2018-04-09 DIAGNOSIS — D509 Iron deficiency anemia, unspecified: Secondary | ICD-10-CM | POA: Diagnosis not present

## 2018-04-09 DIAGNOSIS — N2581 Secondary hyperparathyroidism of renal origin: Secondary | ICD-10-CM | POA: Diagnosis not present

## 2018-04-09 DIAGNOSIS — N186 End stage renal disease: Secondary | ICD-10-CM | POA: Diagnosis not present

## 2018-04-10 ENCOUNTER — Ambulatory Visit (HOSPITAL_COMMUNITY)
Admission: RE | Admit: 2018-04-10 | Discharge: 2018-04-10 | Disposition: A | Discharge status: Home / Self Care | Payer: Medicare Other | Attending: Surgery | Admitting: Surgery

## 2018-04-10 ENCOUNTER — Other Ambulatory Visit: Payer: Self-pay

## 2018-04-10 ENCOUNTER — Encounter (HOSPITAL_COMMUNITY)
Admission: RE | Disposition: A | Discharge status: Home / Self Care | Payer: Self-pay | Source: Home / Self Care | Attending: Surgery

## 2018-04-10 ENCOUNTER — Other Ambulatory Visit: Payer: Self-pay | Admitting: *Deleted

## 2018-04-10 DIAGNOSIS — T82858A Stenosis of vascular prosthetic devices, implants and grafts, initial encounter: Secondary | ICD-10-CM | POA: Diagnosis not present

## 2018-04-10 DIAGNOSIS — Y841 Kidney dialysis as the cause of abnormal reaction of the patient, or of later complication, without mention of misadventure at the time of the procedure: Secondary | ICD-10-CM | POA: Diagnosis not present

## 2018-04-10 DIAGNOSIS — N186 End stage renal disease: Secondary | ICD-10-CM | POA: Diagnosis not present

## 2018-04-10 DIAGNOSIS — Z992 Dependence on renal dialysis: Secondary | ICD-10-CM | POA: Diagnosis not present

## 2018-04-10 DIAGNOSIS — T82898A Other specified complication of vascular prosthetic devices, implants and grafts, initial encounter: Secondary | ICD-10-CM | POA: Diagnosis not present

## 2018-04-10 HISTORY — PX: PERIPHERAL VASCULAR BALLOON ANGIOPLASTY: CATH118281

## 2018-04-10 HISTORY — PX: A/V FISTULAGRAM: CATH118298

## 2018-04-10 LAB — PROTIME-INR
INR: 1.5 — ABNORMAL HIGH (ref 0.8–1.2)
Prothrombin Time: 17.5 seconds — ABNORMAL HIGH (ref 11.4–15.2)

## 2018-04-10 LAB — POCT I-STAT 4, (NA,K, GLUC, HGB,HCT)
Glucose, Bld: 120 mg/dL — ABNORMAL HIGH (ref 70–99)
HCT: 39 % (ref 39.0–52.0)
Hemoglobin: 13.3 g/dL (ref 13.0–17.0)
Potassium: 4.3 mmol/L (ref 3.5–5.1)
Sodium: 136 mmol/L (ref 135–145)

## 2018-04-10 SURGERY — A/V FISTULAGRAM
Anesthesia: LOCAL | Laterality: Right

## 2018-04-10 MED ORDER — MIDAZOLAM HCL 2 MG/2ML IJ SOLN
INTRAMUSCULAR | Status: AC
  Filled 2018-04-10: qty 2

## 2018-04-10 MED ORDER — LIDOCAINE HCL (PF) 1 % IJ SOLN
INTRAMUSCULAR | Status: AC
  Filled 2018-04-10: qty 30

## 2018-04-10 MED ORDER — LIDOCAINE HCL (PF) 1 % IJ SOLN
INTRAMUSCULAR | Status: DC | PRN
  Administered 2018-04-10: 2 mL

## 2018-04-10 MED ORDER — FENTANYL CITRATE (PF) 100 MCG/2ML IJ SOLN
INTRAMUSCULAR | Status: AC
  Filled 2018-04-10: qty 2

## 2018-04-10 MED ORDER — FENTANYL CITRATE (PF) 100 MCG/2ML IJ SOLN
INTRAMUSCULAR | Status: DC | PRN
  Administered 2018-04-10: 25 ug via INTRAVENOUS

## 2018-04-10 MED ORDER — HEPARIN (PORCINE) IN NACL 1000-0.9 UT/500ML-% IV SOLN
INTRAVENOUS | Status: AC
  Filled 2018-04-10: qty 500

## 2018-04-10 MED ORDER — IODIXANOL 320 MG/ML IV SOLN
INTRAVENOUS | Status: DC | PRN
  Administered 2018-04-10: 50 mL via INTRAVENOUS

## 2018-04-10 MED ORDER — HEPARIN (PORCINE) IN NACL 1000-0.9 UT/500ML-% IV SOLN
INTRAVENOUS | Status: DC | PRN
  Administered 2018-04-10: 500 mL

## 2018-04-10 MED ORDER — MIDAZOLAM HCL 2 MG/2ML IJ SOLN
INTRAMUSCULAR | Status: DC | PRN
  Administered 2018-04-10: 1 mg via INTRAVENOUS

## 2018-04-10 MED ORDER — SODIUM CHLORIDE 0.9% FLUSH: 3.0000 mL | INTRAVENOUS | Status: DC | PRN

## 2018-04-10 SURGICAL SUPPLY — 16 items
BAG SNAP BAND KOVER 36X36 (MISCELLANEOUS) ×2 IMPLANT
BALLN LUTONIX AV 7X60X75 (BALLOONS) ×2
BALLN MUSTANG 8X80X75 (BALLOONS) ×2
BALLOON LUTONIX AV 7X60X75 (BALLOONS) IMPLANT
BALLOON MUSTANG 8X80X75 (BALLOONS) IMPLANT
COVER DOME SNAP 22 D (MISCELLANEOUS) ×2 IMPLANT
KIT ENCORE 26 ADVANTAGE (KITS) ×1 IMPLANT
KIT MICROPUNCTURE NIT STIFF (SHEATH) ×1 IMPLANT
PROTECTION STATION PRESSURIZED (MISCELLANEOUS) ×2
SHEATH PINNACLE R/O II 6F 4CM (SHEATH) ×1 IMPLANT
SHEATH PROBE COVER 6X72 (BAG) ×3 IMPLANT
STATION PROTECTION PRESSURIZED (MISCELLANEOUS) ×1 IMPLANT
STOPCOCK MORSE 400PSI 3WAY (MISCELLANEOUS) ×2 IMPLANT
TRAY PV CATH (CUSTOM PROCEDURE TRAY) ×2 IMPLANT
TUBING CIL FLEX 10 FLL-RA (TUBING) ×2 IMPLANT
WIRE BENTSON .035X145CM (WIRE) ×1 IMPLANT

## 2018-04-10 NOTE — Discharge Instructions (Signed)

## 2018-04-10 NOTE — H&P (Addendum)
   Patient name: Kevin James MRN: [id number] DOB: 1955-08-14 Sex: male    HISTORY OF PRESENT ILLNESS:   DUSTON SMOLENSKI is a 63 y.o. male with a poorly functioning right arm access, last intervened on at [facility] in January 2019  CURRENT MEDICATIONS:    Current Facility-Administered Medications  Medication Dose Route Frequency Provider Last Rate Last Dose  . sodium chloride flush (NS) 0.9 % injection 3 mL  3 mL Intravenous PRN Serafina Mitchell, MD        REVIEW OF SYSTEMS:   [X]  denotes positive finding, [ ]  denotes negative finding Cardiac  Comments:  Chest pain or chest pressure:    Shortness of breath upon exertion:    Short of breath when lying flat:    Irregular heart rhythm:    Constitutional    Fever or chills:      PHYSICAL EXAM:   Vitals:   04/10/18 0616  BP: 104/74  Pulse: 76  Resp: 18  Temp: 97.7 F (36.5 C)  TempSrc: Oral  SpO2: 95%  Weight: 124.3 kg  Height: 6\' 1"  (1.854 m)    GENERAL: The patient is a well-nourished male, in no acute distress. The vital signs are documented above. CARDIOVASCULAR: There is a regular rate and rhythm. PULMONARY: Non-labored respirations      MEDICAL ISSUES:   Plan for fistulogram with intervention as indicated  Annamarie Major, IV, MD, FACS [facility] [phone] Pager [phone]

## 2018-04-10 NOTE — Op Note (Signed)
    Patient name: Kevin James MRN: [id number] DOB: 02/10/1955 Sex: male  04/10/2018 Pre-operative Diagnosis: Poorly functioning right basilic vein fistula Post-operative diagnosis:  Same Surgeon:  Annamarie Major Procedure Performed:  1.  Ultrasound-guided access, right basilic vein  2.  Fistulogram  3.  Angioplasty right basilic vein (peripheral vein)  4.  Conscious sedation, 31 minutes    Indications: The patient is having trouble with his flow rates.  He comes in today for further evaluation  Procedure:  The patient was identified in the holding area and taken to room 8.  The patient was then placed supine on the table and prepped and draped in the usual sterile fashion.  A time out was called.  Conscious sedation was administered with the use of IV fentanyl and Versed under continuous physician and nurse monitoring.  Heart rate, blood pressure, and oxygen saturations were continuously monitored.  Total sedation time was 31 minutes ultrasound was used to evaluate the fistula.  The vein was patent and compressible.  A digital ultrasound image was acquired.  The fistula was then accessed under ultrasound guidance using a micropuncture needle.  An 018 wire was then asvanced without resistance and a micropuncture sheath was placed.  Contrast injections were then performed through the sheath.  Findings: The central venous system is widely patent.  There is a 90% stenosis in the midportion of the fistula.  The caliber of the vein is smaller near the arterial venous anastomosis without isolated stenosis.   Intervention: After the above images were acquired, decision was made to proceed with intervention.  Over a Bentson wire, a 6 French sheath was placed.  I then inserted a 7 x 60 Lutonix balloon and perform drug-coated balloon angioplasty for 2 minutes at 10 atm.  Follow-up imaging revealed significant improvement results however there were still areas of luminal narrowing and so I elected to upsize  to an 8 x 80 Mustang balloon.  The balloon was taken to 22 atm for 2 minutes.  Follow-up imaging showed contained rupture within the vein and so I inserted the 8 mm balloon again at low pressure for 3 minutes.  Completion imaging showed reduction in the size of the contained rupture.  At this point I felt it was safe to stop the procedure.  Catheters and wires were removed.  The sheath was removed and closed with a pursestring suture.  Impression:  #1  90% stenosis within the basilic vein fistula treated using a 7 mm drug-coated balloon followed by an 8 mm balloon.  There was a small contained rupture after angioplasty using the 8 mm balloon.  Low-pressure inflation was performed which showed decrease extravasation.  Residual stenosis was less than 10%  #2  Diffuse narrowing of the proximal portion of the vein.  Patient continues to have issues with his fistula, access from the upper arm or from the radial artery could be performed to address this area.     Theotis Burrow, M.D., [facility] [place] Office: [phone] Pager:  [phone]

## 2018-04-11 ENCOUNTER — Encounter (HOSPITAL_COMMUNITY): Payer: Self-pay | Admitting: Surgery

## 2018-04-11 DIAGNOSIS — D509 Iron deficiency anemia, unspecified: Secondary | ICD-10-CM | POA: Diagnosis not present

## 2018-04-11 DIAGNOSIS — N186 End stage renal disease: Secondary | ICD-10-CM | POA: Diagnosis not present

## 2018-04-11 DIAGNOSIS — N2581 Secondary hyperparathyroidism of renal origin: Secondary | ICD-10-CM | POA: Diagnosis not present

## 2018-04-14 DIAGNOSIS — N2581 Secondary hyperparathyroidism of renal origin: Secondary | ICD-10-CM | POA: Diagnosis not present

## 2018-04-14 DIAGNOSIS — D509 Iron deficiency anemia, unspecified: Secondary | ICD-10-CM | POA: Diagnosis not present

## 2018-04-14 DIAGNOSIS — N186 End stage renal disease: Secondary | ICD-10-CM | POA: Diagnosis not present

## 2018-04-16 DIAGNOSIS — N186 End stage renal disease: Secondary | ICD-10-CM | POA: Diagnosis not present

## 2018-04-16 DIAGNOSIS — D509 Iron deficiency anemia, unspecified: Secondary | ICD-10-CM | POA: Diagnosis not present

## 2018-04-16 DIAGNOSIS — N2581 Secondary hyperparathyroidism of renal origin: Secondary | ICD-10-CM | POA: Diagnosis not present

## 2018-04-18 DIAGNOSIS — N186 End stage renal disease: Secondary | ICD-10-CM | POA: Diagnosis not present

## 2018-04-18 DIAGNOSIS — D509 Iron deficiency anemia, unspecified: Secondary | ICD-10-CM | POA: Diagnosis not present

## 2018-04-18 DIAGNOSIS — N2581 Secondary hyperparathyroidism of renal origin: Secondary | ICD-10-CM | POA: Diagnosis not present

## 2018-04-21 ENCOUNTER — Telehealth: Payer: Self-pay

## 2018-04-21 DIAGNOSIS — N2581 Secondary hyperparathyroidism of renal origin: Secondary | ICD-10-CM | POA: Diagnosis not present

## 2018-04-21 DIAGNOSIS — D509 Iron deficiency anemia, unspecified: Secondary | ICD-10-CM | POA: Diagnosis not present

## 2018-04-21 DIAGNOSIS — N186 End stage renal disease: Secondary | ICD-10-CM | POA: Diagnosis not present

## 2018-04-21 NOTE — Telephone Encounter (Signed)
Returned call to pt and lmomed yet again for prescreen drive thru aware

## 2018-04-21 NOTE — Telephone Encounter (Signed)
lmom for prescreen/drive thru 

## 2018-04-21 NOTE — Telephone Encounter (Signed)
Please call wife.  She states the phone never rang.

## 2018-04-22 ENCOUNTER — Other Ambulatory Visit: Payer: Self-pay

## 2018-04-22 ENCOUNTER — Ambulatory Visit (INDEPENDENT_AMBULATORY_CARE_PROVIDER_SITE_OTHER): Payer: Medicare Other

## 2018-04-22 DIAGNOSIS — I480 Paroxysmal atrial fibrillation: Secondary | ICD-10-CM | POA: Diagnosis not present

## 2018-04-22 DIAGNOSIS — Z5181 Encounter for therapeutic drug level monitoring: Secondary | ICD-10-CM

## 2018-04-22 LAB — POCT INR: INR: 5.2 — AB (ref 2.0–3.0)

## 2018-04-22 NOTE — Patient Instructions (Signed)
Description   Called spoke with pt, advised to hold Coumadin x 3 dosages, then resume same dosage 1 tablet every day except for 1/2 tablet on Wednesdays. Recheck INR in 7-10 days. Pt previously a 6 weeker, unsure why the elevated INR.  [facility], call with changes in medication or procedures

## 2018-04-23 DIAGNOSIS — N2581 Secondary hyperparathyroidism of renal origin: Secondary | ICD-10-CM | POA: Diagnosis not present

## 2018-04-23 DIAGNOSIS — D509 Iron deficiency anemia, unspecified: Secondary | ICD-10-CM | POA: Diagnosis not present

## 2018-04-23 DIAGNOSIS — N186 End stage renal disease: Secondary | ICD-10-CM | POA: Diagnosis not present

## 2018-04-24 DIAGNOSIS — T82858A Stenosis of vascular prosthetic devices, implants and grafts, initial encounter: Secondary | ICD-10-CM | POA: Diagnosis not present

## 2018-04-24 DIAGNOSIS — I871 Compression of vein: Secondary | ICD-10-CM | POA: Diagnosis not present

## 2018-04-25 ENCOUNTER — Other Ambulatory Visit: Payer: Self-pay | Admitting: Neurology

## 2018-04-25 DIAGNOSIS — N186 End stage renal disease: Secondary | ICD-10-CM | POA: Diagnosis not present

## 2018-04-25 DIAGNOSIS — G629 Polyneuropathy, unspecified: Secondary | ICD-10-CM

## 2018-04-25 DIAGNOSIS — D509 Iron deficiency anemia, unspecified: Secondary | ICD-10-CM | POA: Diagnosis not present

## 2018-04-25 DIAGNOSIS — N2581 Secondary hyperparathyroidism of renal origin: Secondary | ICD-10-CM | POA: Diagnosis not present

## 2018-04-25 DIAGNOSIS — G2581 Restless legs syndrome: Secondary | ICD-10-CM

## 2018-04-25 DIAGNOSIS — I482 Chronic atrial fibrillation, unspecified: Secondary | ICD-10-CM

## 2018-04-25 DIAGNOSIS — G4733 Obstructive sleep apnea (adult) (pediatric): Secondary | ICD-10-CM

## 2018-04-25 DIAGNOSIS — G4761 Periodic limb movement disorder: Secondary | ICD-10-CM

## 2018-04-25 DIAGNOSIS — Z9989 Dependence on other enabling machines and devices: Principal | ICD-10-CM

## 2018-04-28 DIAGNOSIS — N2581 Secondary hyperparathyroidism of renal origin: Secondary | ICD-10-CM | POA: Diagnosis not present

## 2018-04-28 DIAGNOSIS — N186 End stage renal disease: Secondary | ICD-10-CM | POA: Diagnosis not present

## 2018-04-28 DIAGNOSIS — D509 Iron deficiency anemia, unspecified: Secondary | ICD-10-CM | POA: Diagnosis not present

## 2018-04-29 ENCOUNTER — Telehealth: Payer: Self-pay | Admitting: Pharmacist Clinician (PhC)/ Clinical Pharmacy Specialist

## 2018-04-29 ENCOUNTER — Telehealth: Payer: Self-pay | Admitting: Neurology

## 2018-04-29 ENCOUNTER — Telehealth: Payer: Self-pay

## 2018-04-29 NOTE — Telephone Encounter (Signed)
lmom for prescreen  

## 2018-04-29 NOTE — Telephone Encounter (Signed)

## 2018-04-29 NOTE — Telephone Encounter (Signed)
Due to current COVID 19 pandemic, our office is severely reducing in office visits until further notice, in order to minimize the risk to our patients and healthcare providers.   Called patient to offer him a sooner appointment via virtual visit with Dr. Rexene Alberts. Patient declined and explained he does not have the resources for the virtual visit. I then offered patient a telephone visit, to which he accepted. I advised patient that he should be prepared to receive a call between 10:30 and 11:30. Patient understands he will receive a call from [facility] RN to update chart history prior to appointment, as well as a call from front office staff around 10:30 to complete the check-in process.  Pt understands that although there may be some limitations with this type of visit, we will take all precautions to reduce any security or privacy concerns.  Pt understands that this will be treated like an in office visit and we will file with pt's insurance, and there may be a patient responsible charge related to this service.

## 2018-04-30 DIAGNOSIS — D509 Iron deficiency anemia, unspecified: Secondary | ICD-10-CM | POA: Diagnosis not present

## 2018-04-30 DIAGNOSIS — N2581 Secondary hyperparathyroidism of renal origin: Secondary | ICD-10-CM | POA: Diagnosis not present

## 2018-04-30 DIAGNOSIS — N186 End stage renal disease: Secondary | ICD-10-CM | POA: Diagnosis not present

## 2018-05-01 ENCOUNTER — Ambulatory Visit (INDEPENDENT_AMBULATORY_CARE_PROVIDER_SITE_OTHER): Payer: Medicare Other | Admitting: Pharmacist

## 2018-05-01 ENCOUNTER — Other Ambulatory Visit: Payer: Self-pay

## 2018-05-01 DIAGNOSIS — I480 Paroxysmal atrial fibrillation: Secondary | ICD-10-CM

## 2018-05-01 DIAGNOSIS — Z5181 Encounter for therapeutic drug level monitoring: Secondary | ICD-10-CM | POA: Diagnosis not present

## 2018-05-01 LAB — POCT INR: INR: 1.9 — AB (ref 2.0–3.0)

## 2018-05-02 DIAGNOSIS — N186 End stage renal disease: Secondary | ICD-10-CM | POA: Diagnosis not present

## 2018-05-02 DIAGNOSIS — N2581 Secondary hyperparathyroidism of renal origin: Secondary | ICD-10-CM | POA: Diagnosis not present

## 2018-05-02 DIAGNOSIS — D509 Iron deficiency anemia, unspecified: Secondary | ICD-10-CM | POA: Diagnosis not present

## 2018-05-05 DIAGNOSIS — D509 Iron deficiency anemia, unspecified: Secondary | ICD-10-CM | POA: Diagnosis not present

## 2018-05-05 DIAGNOSIS — N186 End stage renal disease: Secondary | ICD-10-CM | POA: Diagnosis not present

## 2018-05-05 DIAGNOSIS — N2581 Secondary hyperparathyroidism of renal origin: Secondary | ICD-10-CM | POA: Diagnosis not present

## 2018-05-07 ENCOUNTER — Encounter: Payer: Self-pay | Admitting: Neurology

## 2018-05-07 DIAGNOSIS — N2581 Secondary hyperparathyroidism of renal origin: Secondary | ICD-10-CM | POA: Diagnosis not present

## 2018-05-07 DIAGNOSIS — D509 Iron deficiency anemia, unspecified: Secondary | ICD-10-CM | POA: Diagnosis not present

## 2018-05-07 DIAGNOSIS — N186 End stage renal disease: Secondary | ICD-10-CM | POA: Diagnosis not present

## 2018-05-07 NOTE — Telephone Encounter (Signed)
Called the patient to review their chart and made sure that everything was up to date. Wife answered and I was able to review everything with the patient's wife. Instructed the patient that apx 30 min prior to the appointment the front staff will contact them to make sure they are ready to go for their appointment. Confirmed the best number to contact is the home number [phone]. Pt's wife verbalized understanding.

## 2018-05-08 ENCOUNTER — Encounter: Payer: Self-pay | Admitting: Neurology

## 2018-05-08 ENCOUNTER — Other Ambulatory Visit: Payer: Self-pay

## 2018-05-08 ENCOUNTER — Ambulatory Visit (INDEPENDENT_AMBULATORY_CARE_PROVIDER_SITE_OTHER): Payer: Medicare Other | Admitting: Neurology

## 2018-05-08 DIAGNOSIS — G4761 Periodic limb movement disorder: Secondary | ICD-10-CM

## 2018-05-08 DIAGNOSIS — G2581 Restless legs syndrome: Secondary | ICD-10-CM

## 2018-05-08 DIAGNOSIS — I482 Chronic atrial fibrillation, unspecified: Secondary | ICD-10-CM

## 2018-05-08 DIAGNOSIS — Z9989 Dependence on other enabling machines and devices: Secondary | ICD-10-CM

## 2018-05-08 DIAGNOSIS — G629 Polyneuropathy, unspecified: Secondary | ICD-10-CM | POA: Diagnosis not present

## 2018-05-08 DIAGNOSIS — E1129 Type 2 diabetes mellitus with other diabetic kidney complication: Secondary | ICD-10-CM | POA: Diagnosis not present

## 2018-05-08 DIAGNOSIS — Z992 Dependence on renal dialysis: Secondary | ICD-10-CM | POA: Diagnosis not present

## 2018-05-08 DIAGNOSIS — N186 End stage renal disease: Secondary | ICD-10-CM | POA: Diagnosis not present

## 2018-05-08 DIAGNOSIS — G4733 Obstructive sleep apnea (adult) (pediatric): Secondary | ICD-10-CM | POA: Diagnosis not present

## 2018-05-08 MED ORDER — GABAPENTIN 100 MG PO CAPS: 100.0000 mg | ORAL_CAPSULE | Freq: Every day | ORAL | 3 refills | Status: DC
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
## 2018-05-08 NOTE — Patient Instructions (Signed)
Given over the phone during today's phone call virtual visit.

## 2018-05-09 DIAGNOSIS — E8770 Fluid overload, unspecified: Secondary | ICD-10-CM | POA: Diagnosis not present

## 2018-05-09 DIAGNOSIS — N2581 Secondary hyperparathyroidism of renal origin: Secondary | ICD-10-CM | POA: Diagnosis not present

## 2018-05-09 DIAGNOSIS — N186 End stage renal disease: Secondary | ICD-10-CM | POA: Diagnosis not present

## 2018-05-12 ENCOUNTER — Other Ambulatory Visit: Payer: Self-pay

## 2018-05-12 ENCOUNTER — Other Ambulatory Visit: Payer: Self-pay | Admitting: Internal Medicine

## 2018-05-12 DIAGNOSIS — E8770 Fluid overload, unspecified: Secondary | ICD-10-CM | POA: Diagnosis not present

## 2018-05-12 DIAGNOSIS — N2581 Secondary hyperparathyroidism of renal origin: Secondary | ICD-10-CM | POA: Diagnosis not present

## 2018-05-12 DIAGNOSIS — N186 End stage renal disease: Secondary | ICD-10-CM | POA: Diagnosis not present

## 2018-05-12 MED ORDER — WARFARIN SODIUM 5 MG PO TABS: 5.0000 mg | ORAL_TABLET | Freq: Every day | ORAL | 0 refills | Status: DC

## 2018-05-12 MED ORDER — WARFARIN SODIUM 5 MG PO TABS: 2.5000 mg | ORAL_TABLET | ORAL | 0 refills | Status: DC

## 2018-05-12 NOTE — Progress Notes (Signed)
Order for pressure change to cpap sent to [facility] via community message. Confirmation received that the order transmitted was successful.

## 2018-05-14 DIAGNOSIS — N186 End stage renal disease: Secondary | ICD-10-CM | POA: Diagnosis not present

## 2018-05-14 DIAGNOSIS — N2581 Secondary hyperparathyroidism of renal origin: Secondary | ICD-10-CM | POA: Diagnosis not present

## 2018-05-14 DIAGNOSIS — E8770 Fluid overload, unspecified: Secondary | ICD-10-CM | POA: Diagnosis not present

## 2018-05-16 DIAGNOSIS — N2581 Secondary hyperparathyroidism of renal origin: Secondary | ICD-10-CM | POA: Diagnosis not present

## 2018-05-16 DIAGNOSIS — N186 End stage renal disease: Secondary | ICD-10-CM | POA: Diagnosis not present

## 2018-05-16 DIAGNOSIS — E8770 Fluid overload, unspecified: Secondary | ICD-10-CM | POA: Diagnosis not present

## 2018-05-19 DIAGNOSIS — N186 End stage renal disease: Secondary | ICD-10-CM | POA: Diagnosis not present

## 2018-05-19 DIAGNOSIS — N2581 Secondary hyperparathyroidism of renal origin: Secondary | ICD-10-CM | POA: Diagnosis not present

## 2018-05-19 DIAGNOSIS — E8770 Fluid overload, unspecified: Secondary | ICD-10-CM | POA: Diagnosis not present

## 2018-05-21 DIAGNOSIS — E119 Type 2 diabetes mellitus without complications: Secondary | ICD-10-CM | POA: Diagnosis not present

## 2018-05-21 DIAGNOSIS — E8770 Fluid overload, unspecified: Secondary | ICD-10-CM | POA: Diagnosis not present

## 2018-05-21 DIAGNOSIS — N2581 Secondary hyperparathyroidism of renal origin: Secondary | ICD-10-CM | POA: Diagnosis not present

## 2018-05-21 DIAGNOSIS — N186 End stage renal disease: Secondary | ICD-10-CM | POA: Diagnosis not present

## 2018-05-22 DIAGNOSIS — N186 End stage renal disease: Secondary | ICD-10-CM | POA: Diagnosis not present

## 2018-05-23 DIAGNOSIS — E8770 Fluid overload, unspecified: Secondary | ICD-10-CM | POA: Diagnosis not present

## 2018-05-23 DIAGNOSIS — N186 End stage renal disease: Secondary | ICD-10-CM | POA: Diagnosis not present

## 2018-05-23 DIAGNOSIS — N2581 Secondary hyperparathyroidism of renal origin: Secondary | ICD-10-CM | POA: Diagnosis not present

## 2018-05-26 ENCOUNTER — Telehealth: Payer: Self-pay

## 2018-05-26 DIAGNOSIS — E8770 Fluid overload, unspecified: Secondary | ICD-10-CM | POA: Diagnosis not present

## 2018-05-26 DIAGNOSIS — N2581 Secondary hyperparathyroidism of renal origin: Secondary | ICD-10-CM | POA: Diagnosis not present

## 2018-05-26 DIAGNOSIS — N186 End stage renal disease: Secondary | ICD-10-CM | POA: Diagnosis not present

## 2018-05-26 NOTE — Telephone Encounter (Signed)

## 2018-05-27 ENCOUNTER — Ambulatory Visit (INDEPENDENT_AMBULATORY_CARE_PROVIDER_SITE_OTHER): Payer: Medicare Other | Admitting: *Deleted

## 2018-05-27 ENCOUNTER — Other Ambulatory Visit: Payer: Self-pay

## 2018-05-27 DIAGNOSIS — Z5181 Encounter for therapeutic drug level monitoring: Secondary | ICD-10-CM | POA: Diagnosis not present

## 2018-05-27 DIAGNOSIS — I480 Paroxysmal atrial fibrillation: Secondary | ICD-10-CM

## 2018-05-27 LAB — POCT INR: INR: 3.4 — AB (ref 2.0–3.0)

## 2018-05-28 DIAGNOSIS — N186 End stage renal disease: Secondary | ICD-10-CM | POA: Diagnosis not present

## 2018-05-28 DIAGNOSIS — N2581 Secondary hyperparathyroidism of renal origin: Secondary | ICD-10-CM | POA: Diagnosis not present

## 2018-05-28 DIAGNOSIS — E8770 Fluid overload, unspecified: Secondary | ICD-10-CM | POA: Diagnosis not present

## 2018-05-29 DIAGNOSIS — E8770 Fluid overload, unspecified: Secondary | ICD-10-CM | POA: Diagnosis not present

## 2018-05-29 DIAGNOSIS — N186 End stage renal disease: Secondary | ICD-10-CM | POA: Diagnosis not present

## 2018-05-29 DIAGNOSIS — N2581 Secondary hyperparathyroidism of renal origin: Secondary | ICD-10-CM | POA: Diagnosis not present

## 2018-05-30 DIAGNOSIS — N2581 Secondary hyperparathyroidism of renal origin: Secondary | ICD-10-CM | POA: Diagnosis not present

## 2018-05-30 DIAGNOSIS — E8770 Fluid overload, unspecified: Secondary | ICD-10-CM | POA: Diagnosis not present

## 2018-05-30 DIAGNOSIS — N186 End stage renal disease: Secondary | ICD-10-CM | POA: Diagnosis not present

## 2018-06-02 DIAGNOSIS — N186 End stage renal disease: Secondary | ICD-10-CM | POA: Diagnosis not present

## 2018-06-02 DIAGNOSIS — E8770 Fluid overload, unspecified: Secondary | ICD-10-CM | POA: Diagnosis not present

## 2018-06-02 DIAGNOSIS — N2581 Secondary hyperparathyroidism of renal origin: Secondary | ICD-10-CM | POA: Diagnosis not present

## 2018-06-03 ENCOUNTER — Ambulatory Visit: Payer: Medicare Other | Admitting: Adult Health

## 2018-06-03 DIAGNOSIS — M792 Neuralgia and neuritis, unspecified: Secondary | ICD-10-CM | POA: Diagnosis not present

## 2018-06-03 DIAGNOSIS — E1151 Type 2 diabetes mellitus with diabetic peripheral angiopathy without gangrene: Secondary | ICD-10-CM | POA: Diagnosis not present

## 2018-06-03 DIAGNOSIS — N186 End stage renal disease: Secondary | ICD-10-CM | POA: Diagnosis not present

## 2018-06-03 DIAGNOSIS — G4733 Obstructive sleep apnea (adult) (pediatric): Secondary | ICD-10-CM | POA: Diagnosis not present

## 2018-06-03 DIAGNOSIS — I10 Essential (primary) hypertension: Secondary | ICD-10-CM | POA: Diagnosis not present

## 2018-06-03 DIAGNOSIS — E1129 Type 2 diabetes mellitus with other diabetic kidney complication: Secondary | ICD-10-CM | POA: Diagnosis not present

## 2018-06-03 DIAGNOSIS — I4891 Unspecified atrial fibrillation: Secondary | ICD-10-CM | POA: Diagnosis not present

## 2018-06-03 DIAGNOSIS — D631 Anemia in chronic kidney disease: Secondary | ICD-10-CM | POA: Diagnosis not present

## 2018-06-04 DIAGNOSIS — N186 End stage renal disease: Secondary | ICD-10-CM | POA: Diagnosis not present

## 2018-06-04 DIAGNOSIS — N2581 Secondary hyperparathyroidism of renal origin: Secondary | ICD-10-CM | POA: Diagnosis not present

## 2018-06-04 DIAGNOSIS — E8770 Fluid overload, unspecified: Secondary | ICD-10-CM | POA: Diagnosis not present

## 2018-06-06 DIAGNOSIS — N2581 Secondary hyperparathyroidism of renal origin: Secondary | ICD-10-CM | POA: Diagnosis not present

## 2018-06-06 DIAGNOSIS — E8770 Fluid overload, unspecified: Secondary | ICD-10-CM | POA: Diagnosis not present

## 2018-06-06 DIAGNOSIS — N186 End stage renal disease: Secondary | ICD-10-CM | POA: Diagnosis not present

## 2018-06-08 DIAGNOSIS — N186 End stage renal disease: Secondary | ICD-10-CM | POA: Diagnosis not present

## 2018-06-08 DIAGNOSIS — Z992 Dependence on renal dialysis: Secondary | ICD-10-CM | POA: Diagnosis not present

## 2018-06-08 DIAGNOSIS — E1129 Type 2 diabetes mellitus with other diabetic kidney complication: Secondary | ICD-10-CM | POA: Diagnosis not present

## 2018-06-09 DIAGNOSIS — N2581 Secondary hyperparathyroidism of renal origin: Secondary | ICD-10-CM | POA: Diagnosis not present

## 2018-06-09 DIAGNOSIS — N186 End stage renal disease: Secondary | ICD-10-CM | POA: Diagnosis not present

## 2018-06-10 ENCOUNTER — Telehealth: Payer: Self-pay

## 2018-06-10 NOTE — Telephone Encounter (Signed)
LMOM FOR MOVE APPT/COVID SCREEN IN OFFICE AWARE

## 2018-06-11 DIAGNOSIS — N186 End stage renal disease: Secondary | ICD-10-CM | POA: Diagnosis not present

## 2018-06-11 DIAGNOSIS — N2581 Secondary hyperparathyroidism of renal origin: Secondary | ICD-10-CM | POA: Diagnosis not present

## 2018-06-13 DIAGNOSIS — N186 End stage renal disease: Secondary | ICD-10-CM | POA: Diagnosis not present

## 2018-06-13 DIAGNOSIS — N2581 Secondary hyperparathyroidism of renal origin: Secondary | ICD-10-CM | POA: Diagnosis not present

## 2018-06-16 DIAGNOSIS — N186 End stage renal disease: Secondary | ICD-10-CM | POA: Diagnosis not present

## 2018-06-16 DIAGNOSIS — N2581 Secondary hyperparathyroidism of renal origin: Secondary | ICD-10-CM | POA: Diagnosis not present

## 2018-06-16 NOTE — Telephone Encounter (Signed)

## 2018-06-17 ENCOUNTER — Ambulatory Visit (INDEPENDENT_AMBULATORY_CARE_PROVIDER_SITE_OTHER): Payer: Medicare Other | Admitting: *Deleted

## 2018-06-17 ENCOUNTER — Other Ambulatory Visit: Payer: Self-pay

## 2018-06-17 DIAGNOSIS — Z5181 Encounter for therapeutic drug level monitoring: Secondary | ICD-10-CM | POA: Diagnosis not present

## 2018-06-17 DIAGNOSIS — I480 Paroxysmal atrial fibrillation: Secondary | ICD-10-CM

## 2018-06-17 LAB — POCT INR: INR: 3.3 — AB (ref 2.0–3.0)

## 2018-06-17 NOTE — Patient Instructions (Signed)
Description   Hold today  then change dose to 1 tablet every day except for 1/2 tablet on  Sundays, Wednesday and Fridays. Recheck INR in 3 weeks.  [facility], call with changes in medication or procedures

## 2018-06-18 DIAGNOSIS — N186 End stage renal disease: Secondary | ICD-10-CM | POA: Diagnosis not present

## 2018-06-18 DIAGNOSIS — N2581 Secondary hyperparathyroidism of renal origin: Secondary | ICD-10-CM | POA: Diagnosis not present

## 2018-06-20 DIAGNOSIS — N2581 Secondary hyperparathyroidism of renal origin: Secondary | ICD-10-CM | POA: Diagnosis not present

## 2018-06-20 DIAGNOSIS — N186 End stage renal disease: Secondary | ICD-10-CM | POA: Diagnosis not present

## 2018-06-23 DIAGNOSIS — N2581 Secondary hyperparathyroidism of renal origin: Secondary | ICD-10-CM | POA: Diagnosis not present

## 2018-06-23 DIAGNOSIS — N186 End stage renal disease: Secondary | ICD-10-CM | POA: Diagnosis not present

## 2018-06-25 DIAGNOSIS — N2581 Secondary hyperparathyroidism of renal origin: Secondary | ICD-10-CM | POA: Diagnosis not present

## 2018-06-25 DIAGNOSIS — N186 End stage renal disease: Secondary | ICD-10-CM | POA: Diagnosis not present

## 2018-06-27 DIAGNOSIS — N186 End stage renal disease: Secondary | ICD-10-CM | POA: Diagnosis not present

## 2018-06-27 DIAGNOSIS — N2581 Secondary hyperparathyroidism of renal origin: Secondary | ICD-10-CM | POA: Diagnosis not present

## 2018-06-30 DIAGNOSIS — N186 End stage renal disease: Secondary | ICD-10-CM | POA: Diagnosis not present

## 2018-06-30 DIAGNOSIS — N2581 Secondary hyperparathyroidism of renal origin: Secondary | ICD-10-CM | POA: Diagnosis not present

## 2018-07-01 ENCOUNTER — Telehealth: Payer: Self-pay

## 2018-07-01 NOTE — Telephone Encounter (Signed)

## 2018-07-02 DIAGNOSIS — N2581 Secondary hyperparathyroidism of renal origin: Secondary | ICD-10-CM | POA: Diagnosis not present

## 2018-07-02 DIAGNOSIS — N186 End stage renal disease: Secondary | ICD-10-CM | POA: Diagnosis not present

## 2018-07-04 DIAGNOSIS — N186 End stage renal disease: Secondary | ICD-10-CM | POA: Diagnosis not present

## 2018-07-04 DIAGNOSIS — N2581 Secondary hyperparathyroidism of renal origin: Secondary | ICD-10-CM | POA: Diagnosis not present

## 2018-07-07 DIAGNOSIS — N2581 Secondary hyperparathyroidism of renal origin: Secondary | ICD-10-CM | POA: Diagnosis not present

## 2018-07-07 DIAGNOSIS — N186 End stage renal disease: Secondary | ICD-10-CM | POA: Diagnosis not present

## 2018-07-08 ENCOUNTER — Other Ambulatory Visit: Payer: Self-pay

## 2018-07-08 ENCOUNTER — Ambulatory Visit (INDEPENDENT_AMBULATORY_CARE_PROVIDER_SITE_OTHER): Payer: Medicare Other | Admitting: *Deleted

## 2018-07-08 DIAGNOSIS — Z992 Dependence on renal dialysis: Secondary | ICD-10-CM | POA: Diagnosis not present

## 2018-07-08 DIAGNOSIS — Z5181 Encounter for therapeutic drug level monitoring: Secondary | ICD-10-CM

## 2018-07-08 DIAGNOSIS — I480 Paroxysmal atrial fibrillation: Secondary | ICD-10-CM | POA: Diagnosis not present

## 2018-07-08 DIAGNOSIS — N186 End stage renal disease: Secondary | ICD-10-CM | POA: Diagnosis not present

## 2018-07-08 DIAGNOSIS — E1129 Type 2 diabetes mellitus with other diabetic kidney complication: Secondary | ICD-10-CM | POA: Diagnosis not present

## 2018-07-08 LAB — POCT INR: INR: 2.3 (ref 2.0–3.0)

## 2018-07-08 NOTE — Patient Instructions (Signed)
Description   Take 1.5 tablets today, then continue to take 1 tablet every day except for 1/2 tablet on  Sundays, Wednesday and Fridays. Recheck INR in 4 weeks.  [phone]- [facility], call with changes in medication or procedures

## 2018-07-09 DIAGNOSIS — N2581 Secondary hyperparathyroidism of renal origin: Secondary | ICD-10-CM | POA: Diagnosis not present

## 2018-07-09 DIAGNOSIS — N186 End stage renal disease: Secondary | ICD-10-CM | POA: Diagnosis not present

## 2018-07-10 DIAGNOSIS — T82858A Stenosis of vascular prosthetic devices, implants and grafts, initial encounter: Secondary | ICD-10-CM | POA: Diagnosis not present

## 2018-07-10 DIAGNOSIS — I871 Compression of vein: Secondary | ICD-10-CM | POA: Diagnosis not present

## 2018-07-11 DIAGNOSIS — N2581 Secondary hyperparathyroidism of renal origin: Secondary | ICD-10-CM | POA: Diagnosis not present

## 2018-07-11 DIAGNOSIS — N186 End stage renal disease: Secondary | ICD-10-CM | POA: Diagnosis not present

## 2018-07-14 DIAGNOSIS — N186 End stage renal disease: Secondary | ICD-10-CM | POA: Diagnosis not present

## 2018-07-14 DIAGNOSIS — N2581 Secondary hyperparathyroidism of renal origin: Secondary | ICD-10-CM | POA: Diagnosis not present

## 2018-07-16 DIAGNOSIS — N186 End stage renal disease: Secondary | ICD-10-CM | POA: Diagnosis not present

## 2018-07-16 DIAGNOSIS — N2581 Secondary hyperparathyroidism of renal origin: Secondary | ICD-10-CM | POA: Diagnosis not present

## 2018-07-18 DIAGNOSIS — N2581 Secondary hyperparathyroidism of renal origin: Secondary | ICD-10-CM | POA: Diagnosis not present

## 2018-07-18 DIAGNOSIS — N186 End stage renal disease: Secondary | ICD-10-CM | POA: Diagnosis not present

## 2018-07-21 DIAGNOSIS — N186 End stage renal disease: Secondary | ICD-10-CM | POA: Diagnosis not present

## 2018-07-21 DIAGNOSIS — N2581 Secondary hyperparathyroidism of renal origin: Secondary | ICD-10-CM | POA: Diagnosis not present

## 2018-07-23 DIAGNOSIS — N2581 Secondary hyperparathyroidism of renal origin: Secondary | ICD-10-CM | POA: Diagnosis not present

## 2018-07-23 DIAGNOSIS — N186 End stage renal disease: Secondary | ICD-10-CM | POA: Diagnosis not present

## 2018-07-25 DIAGNOSIS — N2581 Secondary hyperparathyroidism of renal origin: Secondary | ICD-10-CM | POA: Diagnosis not present

## 2018-07-25 DIAGNOSIS — N186 End stage renal disease: Secondary | ICD-10-CM | POA: Diagnosis not present

## 2018-07-28 DIAGNOSIS — N2581 Secondary hyperparathyroidism of renal origin: Secondary | ICD-10-CM | POA: Diagnosis not present

## 2018-07-28 DIAGNOSIS — N186 End stage renal disease: Secondary | ICD-10-CM | POA: Diagnosis not present

## 2018-07-30 DIAGNOSIS — N186 End stage renal disease: Secondary | ICD-10-CM | POA: Diagnosis not present

## 2018-07-30 DIAGNOSIS — N2581 Secondary hyperparathyroidism of renal origin: Secondary | ICD-10-CM | POA: Diagnosis not present

## 2018-08-01 DIAGNOSIS — N186 End stage renal disease: Secondary | ICD-10-CM | POA: Diagnosis not present

## 2018-08-01 DIAGNOSIS — N2581 Secondary hyperparathyroidism of renal origin: Secondary | ICD-10-CM | POA: Diagnosis not present

## 2018-08-04 DIAGNOSIS — N186 End stage renal disease: Secondary | ICD-10-CM | POA: Diagnosis not present

## 2018-08-04 DIAGNOSIS — N2581 Secondary hyperparathyroidism of renal origin: Secondary | ICD-10-CM | POA: Diagnosis not present

## 2018-08-05 ENCOUNTER — Telehealth: Payer: Self-pay | Admitting: *Deleted

## 2018-08-05 NOTE — Telephone Encounter (Signed)

## 2018-08-06 DIAGNOSIS — N186 End stage renal disease: Secondary | ICD-10-CM | POA: Diagnosis not present

## 2018-08-06 DIAGNOSIS — N2581 Secondary hyperparathyroidism of renal origin: Secondary | ICD-10-CM | POA: Diagnosis not present

## 2018-08-07 ENCOUNTER — Ambulatory Visit (INDEPENDENT_AMBULATORY_CARE_PROVIDER_SITE_OTHER): Payer: Medicare Other | Admitting: *Deleted

## 2018-08-07 ENCOUNTER — Other Ambulatory Visit: Payer: Self-pay

## 2018-08-07 DIAGNOSIS — I480 Paroxysmal atrial fibrillation: Secondary | ICD-10-CM | POA: Diagnosis not present

## 2018-08-07 DIAGNOSIS — Z5181 Encounter for therapeutic drug level monitoring: Secondary | ICD-10-CM

## 2018-08-07 LAB — POCT INR: INR: 2.2 (ref 2.0–3.0)

## 2018-08-07 NOTE — Patient Instructions (Signed)
Description   Continue taking 1 tablet every day except for 1/2 tablet on Sundays, Wednesday and Fridays. Recheck INR in 5 weeks.  [facility], call with changes in medication or procedures

## 2018-08-08 DIAGNOSIS — E1129 Type 2 diabetes mellitus with other diabetic kidney complication: Secondary | ICD-10-CM | POA: Diagnosis not present

## 2018-08-08 DIAGNOSIS — Z992 Dependence on renal dialysis: Secondary | ICD-10-CM | POA: Diagnosis not present

## 2018-08-08 DIAGNOSIS — N186 End stage renal disease: Secondary | ICD-10-CM | POA: Diagnosis not present

## 2018-08-08 DIAGNOSIS — N2581 Secondary hyperparathyroidism of renal origin: Secondary | ICD-10-CM | POA: Diagnosis not present

## 2018-08-11 DIAGNOSIS — N186 End stage renal disease: Secondary | ICD-10-CM | POA: Diagnosis not present

## 2018-08-11 DIAGNOSIS — N2581 Secondary hyperparathyroidism of renal origin: Secondary | ICD-10-CM | POA: Diagnosis not present

## 2018-08-11 DIAGNOSIS — Z992 Dependence on renal dialysis: Secondary | ICD-10-CM | POA: Diagnosis not present

## 2018-08-13 DIAGNOSIS — N186 End stage renal disease: Secondary | ICD-10-CM | POA: Diagnosis not present

## 2018-08-13 DIAGNOSIS — Z992 Dependence on renal dialysis: Secondary | ICD-10-CM | POA: Diagnosis not present

## 2018-08-13 DIAGNOSIS — N2581 Secondary hyperparathyroidism of renal origin: Secondary | ICD-10-CM | POA: Diagnosis not present

## 2018-08-15 DIAGNOSIS — N186 End stage renal disease: Secondary | ICD-10-CM | POA: Diagnosis not present

## 2018-08-15 DIAGNOSIS — Z992 Dependence on renal dialysis: Secondary | ICD-10-CM | POA: Diagnosis not present

## 2018-08-15 DIAGNOSIS — N2581 Secondary hyperparathyroidism of renal origin: Secondary | ICD-10-CM | POA: Diagnosis not present

## 2018-08-18 DIAGNOSIS — Z992 Dependence on renal dialysis: Secondary | ICD-10-CM | POA: Diagnosis not present

## 2018-08-18 DIAGNOSIS — N2581 Secondary hyperparathyroidism of renal origin: Secondary | ICD-10-CM | POA: Diagnosis not present

## 2018-08-18 DIAGNOSIS — N186 End stage renal disease: Secondary | ICD-10-CM | POA: Diagnosis not present

## 2018-08-20 DIAGNOSIS — E119 Type 2 diabetes mellitus without complications: Secondary | ICD-10-CM | POA: Diagnosis not present

## 2018-08-20 DIAGNOSIS — Z992 Dependence on renal dialysis: Secondary | ICD-10-CM | POA: Diagnosis not present

## 2018-08-20 DIAGNOSIS — N2581 Secondary hyperparathyroidism of renal origin: Secondary | ICD-10-CM | POA: Diagnosis not present

## 2018-08-20 DIAGNOSIS — N186 End stage renal disease: Secondary | ICD-10-CM | POA: Diagnosis not present

## 2018-08-22 DIAGNOSIS — Z992 Dependence on renal dialysis: Secondary | ICD-10-CM | POA: Diagnosis not present

## 2018-08-22 DIAGNOSIS — N186 End stage renal disease: Secondary | ICD-10-CM | POA: Diagnosis not present

## 2018-08-22 DIAGNOSIS — N2581 Secondary hyperparathyroidism of renal origin: Secondary | ICD-10-CM | POA: Diagnosis not present

## 2018-08-25 DIAGNOSIS — N2581 Secondary hyperparathyroidism of renal origin: Secondary | ICD-10-CM | POA: Diagnosis not present

## 2018-08-25 DIAGNOSIS — Z992 Dependence on renal dialysis: Secondary | ICD-10-CM | POA: Diagnosis not present

## 2018-08-25 DIAGNOSIS — N186 End stage renal disease: Secondary | ICD-10-CM | POA: Diagnosis not present

## 2018-08-27 DIAGNOSIS — N2581 Secondary hyperparathyroidism of renal origin: Secondary | ICD-10-CM | POA: Diagnosis not present

## 2018-08-27 DIAGNOSIS — N186 End stage renal disease: Secondary | ICD-10-CM | POA: Diagnosis not present

## 2018-08-27 DIAGNOSIS — Z992 Dependence on renal dialysis: Secondary | ICD-10-CM | POA: Diagnosis not present

## 2018-08-29 DIAGNOSIS — N186 End stage renal disease: Secondary | ICD-10-CM | POA: Diagnosis not present

## 2018-08-29 DIAGNOSIS — Z992 Dependence on renal dialysis: Secondary | ICD-10-CM | POA: Diagnosis not present

## 2018-08-29 DIAGNOSIS — N2581 Secondary hyperparathyroidism of renal origin: Secondary | ICD-10-CM | POA: Diagnosis not present

## 2018-09-01 DIAGNOSIS — N2581 Secondary hyperparathyroidism of renal origin: Secondary | ICD-10-CM | POA: Diagnosis not present

## 2018-09-01 DIAGNOSIS — N186 End stage renal disease: Secondary | ICD-10-CM | POA: Diagnosis not present

## 2018-09-01 DIAGNOSIS — Z992 Dependence on renal dialysis: Secondary | ICD-10-CM | POA: Diagnosis not present

## 2018-09-03 DIAGNOSIS — N2581 Secondary hyperparathyroidism of renal origin: Secondary | ICD-10-CM | POA: Diagnosis not present

## 2018-09-03 DIAGNOSIS — Z992 Dependence on renal dialysis: Secondary | ICD-10-CM | POA: Diagnosis not present

## 2018-09-03 DIAGNOSIS — N186 End stage renal disease: Secondary | ICD-10-CM | POA: Diagnosis not present

## 2018-09-05 DIAGNOSIS — Z992 Dependence on renal dialysis: Secondary | ICD-10-CM | POA: Diagnosis not present

## 2018-09-05 DIAGNOSIS — N186 End stage renal disease: Secondary | ICD-10-CM | POA: Diagnosis not present

## 2018-09-05 DIAGNOSIS — N2581 Secondary hyperparathyroidism of renal origin: Secondary | ICD-10-CM | POA: Diagnosis not present

## 2018-09-08 DIAGNOSIS — Z992 Dependence on renal dialysis: Secondary | ICD-10-CM | POA: Diagnosis not present

## 2018-09-08 DIAGNOSIS — N2581 Secondary hyperparathyroidism of renal origin: Secondary | ICD-10-CM | POA: Diagnosis not present

## 2018-09-08 DIAGNOSIS — N186 End stage renal disease: Secondary | ICD-10-CM | POA: Diagnosis not present

## 2018-09-08 DIAGNOSIS — E1129 Type 2 diabetes mellitus with other diabetic kidney complication: Secondary | ICD-10-CM | POA: Diagnosis not present

## 2018-09-09 ENCOUNTER — Ambulatory Visit (INDEPENDENT_AMBULATORY_CARE_PROVIDER_SITE_OTHER): Payer: Medicare Other

## 2018-09-09 ENCOUNTER — Other Ambulatory Visit: Payer: Self-pay

## 2018-09-09 ENCOUNTER — Encounter (INDEPENDENT_AMBULATORY_CARE_PROVIDER_SITE_OTHER): Payer: Self-pay

## 2018-09-09 DIAGNOSIS — Z5181 Encounter for therapeutic drug level monitoring: Secondary | ICD-10-CM | POA: Diagnosis not present

## 2018-09-09 DIAGNOSIS — I480 Paroxysmal atrial fibrillation: Secondary | ICD-10-CM

## 2018-09-09 LAB — POCT INR: INR: 1.8 — AB (ref 2.0–3.0)

## 2018-09-09 NOTE — Patient Instructions (Signed)
Description   Take 1.5 tablets today, then start taking 1 tablet every day except for 1/2 tablet on Sundays and Wednesdays. Recheck INR in 4 weeks.  [facility], call with changes in medication or procedures

## 2018-09-10 DIAGNOSIS — Z23 Encounter for immunization: Secondary | ICD-10-CM | POA: Diagnosis not present

## 2018-09-10 DIAGNOSIS — N186 End stage renal disease: Secondary | ICD-10-CM | POA: Diagnosis not present

## 2018-09-10 DIAGNOSIS — Z992 Dependence on renal dialysis: Secondary | ICD-10-CM | POA: Diagnosis not present

## 2018-09-10 DIAGNOSIS — N2581 Secondary hyperparathyroidism of renal origin: Secondary | ICD-10-CM | POA: Diagnosis not present

## 2018-09-12 DIAGNOSIS — Z23 Encounter for immunization: Secondary | ICD-10-CM | POA: Diagnosis not present

## 2018-09-12 DIAGNOSIS — N186 End stage renal disease: Secondary | ICD-10-CM | POA: Diagnosis not present

## 2018-09-12 DIAGNOSIS — N2581 Secondary hyperparathyroidism of renal origin: Secondary | ICD-10-CM | POA: Diagnosis not present

## 2018-09-12 DIAGNOSIS — Z992 Dependence on renal dialysis: Secondary | ICD-10-CM | POA: Diagnosis not present

## 2018-09-15 ENCOUNTER — Other Ambulatory Visit: Payer: Self-pay | Admitting: Internal Medicine

## 2018-09-15 DIAGNOSIS — Z992 Dependence on renal dialysis: Secondary | ICD-10-CM | POA: Diagnosis not present

## 2018-09-15 DIAGNOSIS — N2581 Secondary hyperparathyroidism of renal origin: Secondary | ICD-10-CM | POA: Diagnosis not present

## 2018-09-15 DIAGNOSIS — Z23 Encounter for immunization: Secondary | ICD-10-CM | POA: Diagnosis not present

## 2018-09-15 DIAGNOSIS — N186 End stage renal disease: Secondary | ICD-10-CM | POA: Diagnosis not present

## 2018-09-17 DIAGNOSIS — N2581 Secondary hyperparathyroidism of renal origin: Secondary | ICD-10-CM | POA: Diagnosis not present

## 2018-09-17 DIAGNOSIS — Z23 Encounter for immunization: Secondary | ICD-10-CM | POA: Diagnosis not present

## 2018-09-17 DIAGNOSIS — N186 End stage renal disease: Secondary | ICD-10-CM | POA: Diagnosis not present

## 2018-09-17 DIAGNOSIS — Z992 Dependence on renal dialysis: Secondary | ICD-10-CM | POA: Diagnosis not present

## 2018-09-18 ENCOUNTER — Other Ambulatory Visit: Payer: Self-pay | Admitting: Internal Medicine

## 2018-09-18 MED ORDER — WARFARIN SODIUM 5 MG PO TABS: 5.0000 mg | ORAL_TABLET | Freq: Every day | ORAL | 0 refills | Status: DC

## 2018-09-18 MED ORDER — METOPROLOL TARTRATE 50 MG PO TABS: 100.0000 mg | ORAL_TABLET | Freq: Two times a day (BID) | ORAL | 5 refills | Status: DC

## 2018-09-19 DIAGNOSIS — Z23 Encounter for immunization: Secondary | ICD-10-CM | POA: Diagnosis not present

## 2018-09-19 DIAGNOSIS — N2581 Secondary hyperparathyroidism of renal origin: Secondary | ICD-10-CM | POA: Diagnosis not present

## 2018-09-19 DIAGNOSIS — N186 End stage renal disease: Secondary | ICD-10-CM | POA: Diagnosis not present

## 2018-09-19 DIAGNOSIS — Z992 Dependence on renal dialysis: Secondary | ICD-10-CM | POA: Diagnosis not present

## 2018-09-22 DIAGNOSIS — N186 End stage renal disease: Secondary | ICD-10-CM | POA: Diagnosis not present

## 2018-09-22 DIAGNOSIS — N2581 Secondary hyperparathyroidism of renal origin: Secondary | ICD-10-CM | POA: Diagnosis not present

## 2018-09-22 DIAGNOSIS — Z992 Dependence on renal dialysis: Secondary | ICD-10-CM | POA: Diagnosis not present

## 2018-09-22 DIAGNOSIS — Z23 Encounter for immunization: Secondary | ICD-10-CM | POA: Diagnosis not present

## 2018-09-24 DIAGNOSIS — N186 End stage renal disease: Secondary | ICD-10-CM | POA: Diagnosis not present

## 2018-09-24 DIAGNOSIS — N2581 Secondary hyperparathyroidism of renal origin: Secondary | ICD-10-CM | POA: Diagnosis not present

## 2018-09-24 DIAGNOSIS — Z992 Dependence on renal dialysis: Secondary | ICD-10-CM | POA: Diagnosis not present

## 2018-09-24 DIAGNOSIS — Z23 Encounter for immunization: Secondary | ICD-10-CM | POA: Diagnosis not present

## 2018-09-26 DIAGNOSIS — Z23 Encounter for immunization: Secondary | ICD-10-CM | POA: Diagnosis not present

## 2018-09-26 DIAGNOSIS — N2581 Secondary hyperparathyroidism of renal origin: Secondary | ICD-10-CM | POA: Diagnosis not present

## 2018-09-26 DIAGNOSIS — Z992 Dependence on renal dialysis: Secondary | ICD-10-CM | POA: Diagnosis not present

## 2018-09-26 DIAGNOSIS — N186 End stage renal disease: Secondary | ICD-10-CM | POA: Diagnosis not present

## 2018-09-29 DIAGNOSIS — N186 End stage renal disease: Secondary | ICD-10-CM | POA: Diagnosis not present

## 2018-09-29 DIAGNOSIS — Z23 Encounter for immunization: Secondary | ICD-10-CM | POA: Diagnosis not present

## 2018-09-29 DIAGNOSIS — Z992 Dependence on renal dialysis: Secondary | ICD-10-CM | POA: Diagnosis not present

## 2018-09-29 DIAGNOSIS — N2581 Secondary hyperparathyroidism of renal origin: Secondary | ICD-10-CM | POA: Diagnosis not present

## 2018-10-01 DIAGNOSIS — N186 End stage renal disease: Secondary | ICD-10-CM | POA: Diagnosis not present

## 2018-10-01 DIAGNOSIS — Z992 Dependence on renal dialysis: Secondary | ICD-10-CM | POA: Diagnosis not present

## 2018-10-01 DIAGNOSIS — Z23 Encounter for immunization: Secondary | ICD-10-CM | POA: Diagnosis not present

## 2018-10-01 DIAGNOSIS — N2581 Secondary hyperparathyroidism of renal origin: Secondary | ICD-10-CM | POA: Diagnosis not present

## 2018-10-03 DIAGNOSIS — Z992 Dependence on renal dialysis: Secondary | ICD-10-CM | POA: Diagnosis not present

## 2018-10-03 DIAGNOSIS — N2581 Secondary hyperparathyroidism of renal origin: Secondary | ICD-10-CM | POA: Diagnosis not present

## 2018-10-03 DIAGNOSIS — Z23 Encounter for immunization: Secondary | ICD-10-CM | POA: Diagnosis not present

## 2018-10-03 DIAGNOSIS — N186 End stage renal disease: Secondary | ICD-10-CM | POA: Diagnosis not present

## 2018-10-06 DIAGNOSIS — N2581 Secondary hyperparathyroidism of renal origin: Secondary | ICD-10-CM | POA: Diagnosis not present

## 2018-10-06 DIAGNOSIS — Z992 Dependence on renal dialysis: Secondary | ICD-10-CM | POA: Diagnosis not present

## 2018-10-06 DIAGNOSIS — Z23 Encounter for immunization: Secondary | ICD-10-CM | POA: Diagnosis not present

## 2018-10-06 DIAGNOSIS — N186 End stage renal disease: Secondary | ICD-10-CM | POA: Diagnosis not present

## 2018-10-07 ENCOUNTER — Ambulatory Visit (INDEPENDENT_AMBULATORY_CARE_PROVIDER_SITE_OTHER): Payer: Medicare Other | Admitting: *Deleted

## 2018-10-07 ENCOUNTER — Other Ambulatory Visit: Payer: Self-pay

## 2018-10-07 DIAGNOSIS — I480 Paroxysmal atrial fibrillation: Secondary | ICD-10-CM

## 2018-10-07 DIAGNOSIS — Z5181 Encounter for therapeutic drug level monitoring: Secondary | ICD-10-CM | POA: Diagnosis not present

## 2018-10-07 LAB — POCT INR: INR: 1.4 — AB (ref 2.0–3.0)

## 2018-10-07 NOTE — Patient Instructions (Signed)
Description   Take 1.5 tablets today and 1 tablet tomorrow,  then start taking 1 tablet every day except for 1/2 tablet on Wednesdays. Recheck INR in 2 weeks.  [facility], call with changes in medication or procedures

## 2018-10-08 DIAGNOSIS — N2581 Secondary hyperparathyroidism of renal origin: Secondary | ICD-10-CM | POA: Diagnosis not present

## 2018-10-08 DIAGNOSIS — E1129 Type 2 diabetes mellitus with other diabetic kidney complication: Secondary | ICD-10-CM | POA: Diagnosis not present

## 2018-10-08 DIAGNOSIS — N186 End stage renal disease: Secondary | ICD-10-CM | POA: Diagnosis not present

## 2018-10-08 DIAGNOSIS — Z23 Encounter for immunization: Secondary | ICD-10-CM | POA: Diagnosis not present

## 2018-10-08 DIAGNOSIS — Z992 Dependence on renal dialysis: Secondary | ICD-10-CM | POA: Diagnosis not present

## 2018-10-10 DIAGNOSIS — N186 End stage renal disease: Secondary | ICD-10-CM | POA: Diagnosis not present

## 2018-10-10 DIAGNOSIS — N2581 Secondary hyperparathyroidism of renal origin: Secondary | ICD-10-CM | POA: Diagnosis not present

## 2018-10-10 DIAGNOSIS — Z992 Dependence on renal dialysis: Secondary | ICD-10-CM | POA: Diagnosis not present

## 2018-10-13 DIAGNOSIS — N2581 Secondary hyperparathyroidism of renal origin: Secondary | ICD-10-CM | POA: Diagnosis not present

## 2018-10-13 DIAGNOSIS — Z992 Dependence on renal dialysis: Secondary | ICD-10-CM | POA: Diagnosis not present

## 2018-10-13 DIAGNOSIS — N186 End stage renal disease: Secondary | ICD-10-CM | POA: Diagnosis not present

## 2018-10-15 DIAGNOSIS — Z992 Dependence on renal dialysis: Secondary | ICD-10-CM | POA: Diagnosis not present

## 2018-10-15 DIAGNOSIS — N186 End stage renal disease: Secondary | ICD-10-CM | POA: Diagnosis not present

## 2018-10-15 DIAGNOSIS — N2581 Secondary hyperparathyroidism of renal origin: Secondary | ICD-10-CM | POA: Diagnosis not present

## 2018-10-17 DIAGNOSIS — N2581 Secondary hyperparathyroidism of renal origin: Secondary | ICD-10-CM | POA: Diagnosis not present

## 2018-10-17 DIAGNOSIS — Z992 Dependence on renal dialysis: Secondary | ICD-10-CM | POA: Diagnosis not present

## 2018-10-17 DIAGNOSIS — N186 End stage renal disease: Secondary | ICD-10-CM | POA: Diagnosis not present

## 2018-10-20 DIAGNOSIS — N186 End stage renal disease: Secondary | ICD-10-CM | POA: Diagnosis not present

## 2018-10-20 DIAGNOSIS — Z992 Dependence on renal dialysis: Secondary | ICD-10-CM | POA: Diagnosis not present

## 2018-10-20 DIAGNOSIS — N2581 Secondary hyperparathyroidism of renal origin: Secondary | ICD-10-CM | POA: Diagnosis not present

## 2018-10-21 ENCOUNTER — Encounter (INDEPENDENT_AMBULATORY_CARE_PROVIDER_SITE_OTHER): Payer: Self-pay

## 2018-10-21 ENCOUNTER — Other Ambulatory Visit: Payer: Self-pay

## 2018-10-21 ENCOUNTER — Ambulatory Visit (INDEPENDENT_AMBULATORY_CARE_PROVIDER_SITE_OTHER): Payer: Medicare Other | Admitting: *Deleted

## 2018-10-21 DIAGNOSIS — I480 Paroxysmal atrial fibrillation: Secondary | ICD-10-CM

## 2018-10-21 DIAGNOSIS — Z5181 Encounter for therapeutic drug level monitoring: Secondary | ICD-10-CM

## 2018-10-21 LAB — POCT INR: INR: 3.9 — AB (ref 2.0–3.0)

## 2018-10-21 NOTE — Patient Instructions (Signed)
Description   Do not take any Warfarin today then continue taking 1 tablet every day except for 1/2 tablet on Wednesdays. Recheck INR in 2 weeks.  [facility], call with changes in medication or procedures

## 2018-10-22 DIAGNOSIS — N2581 Secondary hyperparathyroidism of renal origin: Secondary | ICD-10-CM | POA: Diagnosis not present

## 2018-10-22 DIAGNOSIS — Z992 Dependence on renal dialysis: Secondary | ICD-10-CM | POA: Diagnosis not present

## 2018-10-22 DIAGNOSIS — N186 End stage renal disease: Secondary | ICD-10-CM | POA: Diagnosis not present

## 2018-10-24 DIAGNOSIS — N2581 Secondary hyperparathyroidism of renal origin: Secondary | ICD-10-CM | POA: Diagnosis not present

## 2018-10-24 DIAGNOSIS — N186 End stage renal disease: Secondary | ICD-10-CM | POA: Diagnosis not present

## 2018-10-24 DIAGNOSIS — Z992 Dependence on renal dialysis: Secondary | ICD-10-CM | POA: Diagnosis not present

## 2018-10-27 DIAGNOSIS — N2581 Secondary hyperparathyroidism of renal origin: Secondary | ICD-10-CM | POA: Diagnosis not present

## 2018-10-27 DIAGNOSIS — N186 End stage renal disease: Secondary | ICD-10-CM | POA: Diagnosis not present

## 2018-10-27 DIAGNOSIS — Z992 Dependence on renal dialysis: Secondary | ICD-10-CM | POA: Diagnosis not present

## 2018-10-29 DIAGNOSIS — N2581 Secondary hyperparathyroidism of renal origin: Secondary | ICD-10-CM | POA: Diagnosis not present

## 2018-10-29 DIAGNOSIS — Z992 Dependence on renal dialysis: Secondary | ICD-10-CM | POA: Diagnosis not present

## 2018-10-29 DIAGNOSIS — N186 End stage renal disease: Secondary | ICD-10-CM | POA: Diagnosis not present

## 2018-10-31 DIAGNOSIS — N186 End stage renal disease: Secondary | ICD-10-CM | POA: Diagnosis not present

## 2018-10-31 DIAGNOSIS — N2581 Secondary hyperparathyroidism of renal origin: Secondary | ICD-10-CM | POA: Diagnosis not present

## 2018-10-31 DIAGNOSIS — Z992 Dependence on renal dialysis: Secondary | ICD-10-CM | POA: Diagnosis not present

## 2018-11-03 DIAGNOSIS — Z992 Dependence on renal dialysis: Secondary | ICD-10-CM | POA: Diagnosis not present

## 2018-11-03 DIAGNOSIS — N186 End stage renal disease: Secondary | ICD-10-CM | POA: Diagnosis not present

## 2018-11-03 DIAGNOSIS — N2581 Secondary hyperparathyroidism of renal origin: Secondary | ICD-10-CM | POA: Diagnosis not present

## 2018-11-04 ENCOUNTER — Ambulatory Visit (INDEPENDENT_AMBULATORY_CARE_PROVIDER_SITE_OTHER): Payer: Medicare Other | Admitting: *Deleted

## 2018-11-04 ENCOUNTER — Other Ambulatory Visit: Payer: Self-pay

## 2018-11-04 DIAGNOSIS — Z5181 Encounter for therapeutic drug level monitoring: Secondary | ICD-10-CM | POA: Diagnosis not present

## 2018-11-04 DIAGNOSIS — I480 Paroxysmal atrial fibrillation: Secondary | ICD-10-CM | POA: Diagnosis not present

## 2018-11-04 LAB — POCT INR: INR: 1.8 — AB (ref 2.0–3.0)

## 2018-11-04 NOTE — Patient Instructions (Signed)
Description   Take 1.5 tablet today then continue taking 1 tablet every day except for 1/2 tablet on Wednesdays. Recheck INR in 3 weeks- per pt request,  [facility], call with changes in medication or procedures

## 2018-11-05 DIAGNOSIS — N2581 Secondary hyperparathyroidism of renal origin: Secondary | ICD-10-CM | POA: Diagnosis not present

## 2018-11-05 DIAGNOSIS — N186 End stage renal disease: Secondary | ICD-10-CM | POA: Diagnosis not present

## 2018-11-05 DIAGNOSIS — Z992 Dependence on renal dialysis: Secondary | ICD-10-CM | POA: Diagnosis not present

## 2018-11-07 DIAGNOSIS — Z992 Dependence on renal dialysis: Secondary | ICD-10-CM | POA: Diagnosis not present

## 2018-11-07 DIAGNOSIS — N2581 Secondary hyperparathyroidism of renal origin: Secondary | ICD-10-CM | POA: Diagnosis not present

## 2018-11-07 DIAGNOSIS — N186 End stage renal disease: Secondary | ICD-10-CM | POA: Diagnosis not present

## 2018-11-08 DIAGNOSIS — N186 End stage renal disease: Secondary | ICD-10-CM | POA: Diagnosis not present

## 2018-11-08 DIAGNOSIS — Z992 Dependence on renal dialysis: Secondary | ICD-10-CM | POA: Diagnosis not present

## 2018-11-08 DIAGNOSIS — E1129 Type 2 diabetes mellitus with other diabetic kidney complication: Secondary | ICD-10-CM | POA: Diagnosis not present

## 2018-11-10 DIAGNOSIS — N2581 Secondary hyperparathyroidism of renal origin: Secondary | ICD-10-CM | POA: Diagnosis not present

## 2018-11-10 DIAGNOSIS — N186 End stage renal disease: Secondary | ICD-10-CM | POA: Diagnosis not present

## 2018-11-10 DIAGNOSIS — Z992 Dependence on renal dialysis: Secondary | ICD-10-CM | POA: Diagnosis not present

## 2018-11-12 DIAGNOSIS — Z992 Dependence on renal dialysis: Secondary | ICD-10-CM | POA: Diagnosis not present

## 2018-11-12 DIAGNOSIS — N186 End stage renal disease: Secondary | ICD-10-CM | POA: Diagnosis not present

## 2018-11-12 DIAGNOSIS — N2581 Secondary hyperparathyroidism of renal origin: Secondary | ICD-10-CM | POA: Diagnosis not present

## 2018-11-14 DIAGNOSIS — Z992 Dependence on renal dialysis: Secondary | ICD-10-CM | POA: Diagnosis not present

## 2018-11-14 DIAGNOSIS — N2581 Secondary hyperparathyroidism of renal origin: Secondary | ICD-10-CM | POA: Diagnosis not present

## 2018-11-14 DIAGNOSIS — N186 End stage renal disease: Secondary | ICD-10-CM | POA: Diagnosis not present

## 2018-11-17 DIAGNOSIS — N186 End stage renal disease: Secondary | ICD-10-CM | POA: Diagnosis not present

## 2018-11-17 DIAGNOSIS — N2581 Secondary hyperparathyroidism of renal origin: Secondary | ICD-10-CM | POA: Diagnosis not present

## 2018-11-17 DIAGNOSIS — Z992 Dependence on renal dialysis: Secondary | ICD-10-CM | POA: Diagnosis not present

## 2018-11-19 DIAGNOSIS — E119 Type 2 diabetes mellitus without complications: Secondary | ICD-10-CM | POA: Diagnosis not present

## 2018-11-19 DIAGNOSIS — N186 End stage renal disease: Secondary | ICD-10-CM | POA: Diagnosis not present

## 2018-11-19 DIAGNOSIS — N2581 Secondary hyperparathyroidism of renal origin: Secondary | ICD-10-CM | POA: Diagnosis not present

## 2018-11-19 DIAGNOSIS — Z992 Dependence on renal dialysis: Secondary | ICD-10-CM | POA: Diagnosis not present

## 2018-11-21 DIAGNOSIS — Z992 Dependence on renal dialysis: Secondary | ICD-10-CM | POA: Diagnosis not present

## 2018-11-21 DIAGNOSIS — N2581 Secondary hyperparathyroidism of renal origin: Secondary | ICD-10-CM | POA: Diagnosis not present

## 2018-11-21 DIAGNOSIS — N186 End stage renal disease: Secondary | ICD-10-CM | POA: Diagnosis not present

## 2018-11-24 DIAGNOSIS — N2581 Secondary hyperparathyroidism of renal origin: Secondary | ICD-10-CM | POA: Diagnosis not present

## 2018-11-24 DIAGNOSIS — N186 End stage renal disease: Secondary | ICD-10-CM | POA: Diagnosis not present

## 2018-11-24 DIAGNOSIS — Z992 Dependence on renal dialysis: Secondary | ICD-10-CM | POA: Diagnosis not present

## 2018-11-25 ENCOUNTER — Ambulatory Visit (INDEPENDENT_AMBULATORY_CARE_PROVIDER_SITE_OTHER): Payer: Medicare Other | Admitting: *Deleted

## 2018-11-25 ENCOUNTER — Other Ambulatory Visit: Payer: Self-pay

## 2018-11-25 DIAGNOSIS — Z5181 Encounter for therapeutic drug level monitoring: Secondary | ICD-10-CM

## 2018-11-25 DIAGNOSIS — I480 Paroxysmal atrial fibrillation: Secondary | ICD-10-CM

## 2018-11-25 LAB — POCT INR: INR: 3.8 — AB (ref 2.0–3.0)

## 2018-11-25 NOTE — Patient Instructions (Addendum)
Description   Hold today, then continue taking 1 tablet every day except for 1/2 tablet on Wednesdays. Recheck INR in 3 weeks. [facility], call with changes in medication or procedures

## 2018-11-26 DIAGNOSIS — N2581 Secondary hyperparathyroidism of renal origin: Secondary | ICD-10-CM | POA: Diagnosis not present

## 2018-11-26 DIAGNOSIS — Z992 Dependence on renal dialysis: Secondary | ICD-10-CM | POA: Diagnosis not present

## 2018-11-26 DIAGNOSIS — N186 End stage renal disease: Secondary | ICD-10-CM | POA: Diagnosis not present

## 2018-11-28 DIAGNOSIS — N2581 Secondary hyperparathyroidism of renal origin: Secondary | ICD-10-CM | POA: Diagnosis not present

## 2018-11-28 DIAGNOSIS — N186 End stage renal disease: Secondary | ICD-10-CM | POA: Diagnosis not present

## 2018-11-28 DIAGNOSIS — Z992 Dependence on renal dialysis: Secondary | ICD-10-CM | POA: Diagnosis not present

## 2018-12-01 DIAGNOSIS — Z992 Dependence on renal dialysis: Secondary | ICD-10-CM | POA: Diagnosis not present

## 2018-12-01 DIAGNOSIS — N2581 Secondary hyperparathyroidism of renal origin: Secondary | ICD-10-CM | POA: Diagnosis not present

## 2018-12-01 DIAGNOSIS — N186 End stage renal disease: Secondary | ICD-10-CM | POA: Diagnosis not present

## 2018-12-03 DIAGNOSIS — N186 End stage renal disease: Secondary | ICD-10-CM | POA: Diagnosis not present

## 2018-12-03 DIAGNOSIS — N2581 Secondary hyperparathyroidism of renal origin: Secondary | ICD-10-CM | POA: Diagnosis not present

## 2018-12-03 DIAGNOSIS — Z992 Dependence on renal dialysis: Secondary | ICD-10-CM | POA: Diagnosis not present

## 2018-12-05 DIAGNOSIS — N2581 Secondary hyperparathyroidism of renal origin: Secondary | ICD-10-CM | POA: Diagnosis not present

## 2018-12-05 DIAGNOSIS — N186 End stage renal disease: Secondary | ICD-10-CM | POA: Diagnosis not present

## 2018-12-05 DIAGNOSIS — Z992 Dependence on renal dialysis: Secondary | ICD-10-CM | POA: Diagnosis not present

## 2018-12-08 DIAGNOSIS — N186 End stage renal disease: Secondary | ICD-10-CM | POA: Diagnosis not present

## 2018-12-08 DIAGNOSIS — Z992 Dependence on renal dialysis: Secondary | ICD-10-CM | POA: Diagnosis not present

## 2018-12-08 DIAGNOSIS — N2581 Secondary hyperparathyroidism of renal origin: Secondary | ICD-10-CM | POA: Diagnosis not present

## 2018-12-18 ENCOUNTER — Other Ambulatory Visit: Payer: Self-pay

## 2018-12-18 ENCOUNTER — Ambulatory Visit (INDEPENDENT_AMBULATORY_CARE_PROVIDER_SITE_OTHER): Payer: Medicare Other | Admitting: *Deleted

## 2018-12-18 DIAGNOSIS — I480 Paroxysmal atrial fibrillation: Secondary | ICD-10-CM

## 2018-12-18 DIAGNOSIS — Z5181 Encounter for therapeutic drug level monitoring: Secondary | ICD-10-CM | POA: Diagnosis not present

## 2018-12-18 LAB — POCT INR: INR: 3.1 — AB (ref 2.0–3.0)

## 2018-12-18 NOTE — Patient Instructions (Addendum)
Description   Take 1/2 tablet today, then start taking 1 tablet every day except for 1/2 tablet on Mondays and Wednesdays. Recheck INR in 2-3 weeks. [facility], call with changes in medication or procedures.

## 2018-12-29 ENCOUNTER — Ambulatory Visit (INDEPENDENT_AMBULATORY_CARE_PROVIDER_SITE_OTHER): Payer: Medicare Other | Admitting: *Deleted

## 2018-12-29 ENCOUNTER — Other Ambulatory Visit: Payer: Self-pay

## 2018-12-29 DIAGNOSIS — I480 Paroxysmal atrial fibrillation: Secondary | ICD-10-CM

## 2018-12-29 DIAGNOSIS — Z5181 Encounter for therapeutic drug level monitoring: Secondary | ICD-10-CM | POA: Diagnosis not present

## 2018-12-29 LAB — POCT INR: INR: 3.9 — AB (ref 2.0–3.0)

## 2018-12-29 NOTE — Patient Instructions (Signed)
Description   Hold today, then start taking 1 tablet every day except for 1/2 tablet on Mondays,  Wednesdays and Fridays. Recheck INR 2 weeks. [facility], call with changes in medication or procedures.

## 2019-01-13 ENCOUNTER — Ambulatory Visit (INDEPENDENT_AMBULATORY_CARE_PROVIDER_SITE_OTHER): Payer: Medicare Other | Admitting: *Deleted

## 2019-01-13 ENCOUNTER — Other Ambulatory Visit: Payer: Self-pay

## 2019-01-13 DIAGNOSIS — Z5181 Encounter for therapeutic drug level monitoring: Secondary | ICD-10-CM

## 2019-01-13 DIAGNOSIS — I480 Paroxysmal atrial fibrillation: Secondary | ICD-10-CM | POA: Diagnosis not present

## 2019-01-13 LAB — PROTIME-INR
INR: 5.1 (ref 0.9–1.2)
Prothrombin Time: 54.7 s — ABNORMAL HIGH (ref 9.1–12.0)

## 2019-01-13 LAB — POCT INR: INR: 6.2 — AB (ref 2.0–3.0)

## 2019-01-13 NOTE — Patient Instructions (Addendum)
Description    Called and spoke to pt's wife, instructed for pt to hold today and tomorrow then start taking 1/2 a tablet every day except for 1 tablet on Tuesday, Thursdays and Saturdays. Recheck INR 1 week. [facility], call with changes in medication or procedures.

## 2019-01-13 NOTE — Progress Notes (Signed)
Pt instructed to seek medical attention if he notices any bleeding. Pt sent to lab and instructed not to take any warfarin he is called with updated INR result and further dosing instructions given.

## 2019-01-22 ENCOUNTER — Ambulatory Visit (INDEPENDENT_AMBULATORY_CARE_PROVIDER_SITE_OTHER): Payer: Medicare Other | Admitting: Pharmacist

## 2019-01-22 ENCOUNTER — Other Ambulatory Visit: Payer: Self-pay

## 2019-01-22 DIAGNOSIS — I480 Paroxysmal atrial fibrillation: Secondary | ICD-10-CM

## 2019-01-22 DIAGNOSIS — Z5181 Encounter for therapeutic drug level monitoring: Secondary | ICD-10-CM | POA: Diagnosis not present

## 2019-01-22 LAB — POCT INR: INR: 2.6 (ref 2.0–3.0)

## 2019-01-22 NOTE — Patient Instructions (Signed)
Description   Continue taking 1/2 a tablet every day except for 1 tablet on Tuesday, Thursdays and Saturdays. Recheck INR 3 weeks. [facility], call with changes in medication or procedures.

## 2019-01-27 ENCOUNTER — Other Ambulatory Visit: Payer: Self-pay | Admitting: Internal Medicine

## 2019-01-27 MED ORDER — WARFARIN SODIUM 5 MG PO TABS: ORAL_TABLET | ORAL | 0 refills | Status: DC

## 2019-01-27 NOTE — Telephone Encounter (Signed)
Refill sent in

## 2019-01-27 NOTE — Telephone Encounter (Signed)
°*  STAT* If patient is at the pharmacy, call can be transferred to refill team.   1. Which medications need to be refilled? (please list name of each medication and dose if known) warfarin (COUMADIN) 5 MG tablet  2. Which pharmacy/location (including street and city if local pharmacy) is medication to be sent to? [place], [address]  3. Do they need a 30 day or 90 day supply? 30 day
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
## 2019-01-29 ENCOUNTER — Other Ambulatory Visit: Payer: Self-pay

## 2019-01-29 ENCOUNTER — Ambulatory Visit: Payer: Medicare Other | Admitting: Internal Medicine

## 2019-01-29 ENCOUNTER — Encounter: Payer: Self-pay | Admitting: Internal Medicine

## 2019-01-29 VITALS — BP 122/70 | HR 94 | Ht 74.0 in | Wt 283.4 lb

## 2019-01-29 DIAGNOSIS — I480 Paroxysmal atrial fibrillation: Secondary | ICD-10-CM | POA: Diagnosis not present

## 2019-01-29 DIAGNOSIS — R9431 Abnormal electrocardiogram [ECG] [EKG]: Secondary | ICD-10-CM | POA: Diagnosis not present

## 2019-01-29 MED ORDER — WARFARIN SODIUM 5 MG PO TABS: ORAL_TABLET | ORAL | 0 refills | Status: DC

## 2019-01-29 MED ORDER — EZETIMIBE 10 MG PO TABS: 10.0000 mg | ORAL_TABLET | Freq: Every day | ORAL | 3 refills | Status: AC

## 2019-01-29 NOTE — Patient Instructions (Addendum)
Medication Instructions:  Your physician has recommended you make the following change in your medication:  1.) start ezetimibe (Zetia) 10 mg once a day -take with your largest meal for lipid management  *If you need a refill on your cardiac medications before your next appointment, please call your pharmacy*  Lab Work: none If you have labs (blood work) drawn today and your tests are completely normal, you will receive your results only by: Marland Kitchen MyChart Message (if you have MyChart) OR . A paper copy in the mail If you have any lab test that is abnormal or we need to change your treatment, we will call you to review the results.  Testing/Procedures: Your physician has requested that you have an echocardiogram. Echocardiography is a painless test that uses sound waves to create images of your heart. It provides your doctor with information about the size and shape of your heart and how well your heart's chambers and valves are working. This procedure takes approximately one hour. There are no restrictions for this procedure.   Follow-Up: At [facility], you and your health needs are our priority.  As part of our continuing mission to provide you with exceptional heart care, we have created designated Provider Care Teams.  These Care Teams include your primary Cardiologist (physician) and Advanced Practice Providers (APPs -  Physician Assistants and Nurse Practitioners) who all work together to provide you with the care you need, when you need it.  Your next appointment:   4-5  month(s)  The format for your next appointment:   In person  Provider:   Dorris Carnes, MD  Other Instructions  Addendum: called [facility] and requested last ov note be faxed to Dr. Harrington Challenger at [phone].  The patient is scheduled there today and so they will send today's note this afternoon. Called [facility] and left message on VM requesting last physician visit note be faxed to Dr. Harrington Challenger at  [phone].

## 2019-02-10 ENCOUNTER — Other Ambulatory Visit: Payer: Self-pay

## 2019-02-10 ENCOUNTER — Ambulatory Visit (HOSPITAL_COMMUNITY): Payer: Medicare Other | Attending: Internal Medicine

## 2019-02-10 DIAGNOSIS — I480 Paroxysmal atrial fibrillation: Secondary | ICD-10-CM | POA: Diagnosis present

## 2019-02-10 DIAGNOSIS — R9431 Abnormal electrocardiogram [ECG] [EKG]: Secondary | ICD-10-CM | POA: Diagnosis present

## 2019-02-12 ENCOUNTER — Other Ambulatory Visit: Payer: Self-pay

## 2019-02-12 ENCOUNTER — Ambulatory Visit (INDEPENDENT_AMBULATORY_CARE_PROVIDER_SITE_OTHER): Payer: Medicare Other | Admitting: *Deleted

## 2019-02-12 DIAGNOSIS — Z5181 Encounter for therapeutic drug level monitoring: Secondary | ICD-10-CM | POA: Diagnosis not present

## 2019-02-12 DIAGNOSIS — I480 Paroxysmal atrial fibrillation: Secondary | ICD-10-CM

## 2019-02-12 LAB — POCT INR: INR: 1.8 — AB (ref 2.0–3.0)

## 2019-02-12 NOTE — Patient Instructions (Signed)
Description   Today take 1.5 tablets then continue taking 1/2 tablet every day except for 1 tablet on Tuesday, Thursdays and Saturdays. Recheck INR 3 weeks. [facility], call with changes in medication or procedures.

## 2019-02-16 ENCOUNTER — Other Ambulatory Visit (HOSPITAL_COMMUNITY): Payer: Self-pay

## 2019-02-16 ENCOUNTER — Encounter (HOSPITAL_COMMUNITY): Payer: Self-pay | Admitting: Internal Medicine

## 2019-02-16 ENCOUNTER — Other Ambulatory Visit: Payer: Self-pay

## 2019-02-16 ENCOUNTER — Observation Stay (HOSPITAL_COMMUNITY)
Admission: EM | Admit: 2019-02-16 | Discharge: 2019-02-17 | Disposition: A | Discharge status: Home / Self Care | Payer: Medicare Other | Attending: Family Medicine | Admitting: Family Medicine

## 2019-02-16 ENCOUNTER — Emergency Department (HOSPITAL_COMMUNITY): Payer: Medicare Other

## 2019-02-16 DIAGNOSIS — Z9989 Dependence on other enabling machines and devices: Secondary | ICD-10-CM

## 2019-02-16 DIAGNOSIS — E1122 Type 2 diabetes mellitus with diabetic chronic kidney disease: Secondary | ICD-10-CM | POA: Diagnosis present

## 2019-02-16 DIAGNOSIS — M109 Gout, unspecified: Secondary | ICD-10-CM | POA: Insufficient documentation

## 2019-02-16 DIAGNOSIS — R0902 Hypoxemia: Secondary | ICD-10-CM

## 2019-02-16 DIAGNOSIS — Z992 Dependence on renal dialysis: Secondary | ICD-10-CM

## 2019-02-16 DIAGNOSIS — E1129 Type 2 diabetes mellitus with other diabetic kidney complication: Secondary | ICD-10-CM | POA: Diagnosis present

## 2019-02-16 DIAGNOSIS — Z20822 Contact with and (suspected) exposure to covid-19: Secondary | ICD-10-CM | POA: Diagnosis not present

## 2019-02-16 DIAGNOSIS — J9601 Acute respiratory failure with hypoxia: Principal | ICD-10-CM | POA: Diagnosis present

## 2019-02-16 DIAGNOSIS — E8770 Fluid overload, unspecified: Secondary | ICD-10-CM | POA: Diagnosis present

## 2019-02-16 DIAGNOSIS — J449 Chronic obstructive pulmonary disease, unspecified: Secondary | ICD-10-CM | POA: Diagnosis not present

## 2019-02-16 DIAGNOSIS — Z6836 Body mass index (BMI) 36.0-36.9, adult: Secondary | ICD-10-CM | POA: Insufficient documentation

## 2019-02-16 DIAGNOSIS — G4733 Obstructive sleep apnea (adult) (pediatric): Secondary | ICD-10-CM | POA: Diagnosis not present

## 2019-02-16 DIAGNOSIS — Z87891 Personal history of nicotine dependence: Secondary | ICD-10-CM | POA: Insufficient documentation

## 2019-02-16 DIAGNOSIS — E669 Obesity, unspecified: Secondary | ICD-10-CM | POA: Diagnosis not present

## 2019-02-16 DIAGNOSIS — Z79899 Other long term (current) drug therapy: Secondary | ICD-10-CM | POA: Insufficient documentation

## 2019-02-16 DIAGNOSIS — Z7901 Long term (current) use of anticoagulants: Secondary | ICD-10-CM | POA: Insufficient documentation

## 2019-02-16 DIAGNOSIS — N186 End stage renal disease: Secondary | ICD-10-CM | POA: Diagnosis not present

## 2019-02-16 DIAGNOSIS — E785 Hyperlipidemia, unspecified: Secondary | ICD-10-CM | POA: Diagnosis not present

## 2019-02-16 DIAGNOSIS — I120 Hypertensive chronic kidney disease with stage 5 chronic kidney disease or end stage renal disease: Secondary | ICD-10-CM | POA: Insufficient documentation

## 2019-02-16 DIAGNOSIS — R0602 Shortness of breath: Secondary | ICD-10-CM

## 2019-02-16 DIAGNOSIS — I480 Paroxysmal atrial fibrillation: Secondary | ICD-10-CM | POA: Diagnosis present

## 2019-02-16 DIAGNOSIS — E66812 Obesity, class 2: Secondary | ICD-10-CM | POA: Diagnosis present

## 2019-02-16 DIAGNOSIS — I10 Essential (primary) hypertension: Secondary | ICD-10-CM | POA: Diagnosis present

## 2019-02-16 DIAGNOSIS — E0822 Diabetes mellitus due to underlying condition with diabetic chronic kidney disease: Secondary | ICD-10-CM

## 2019-02-16 LAB — RESPIRATORY PANEL BY RT PCR (FLU A&B, COVID)
Influenza A by PCR: NEGATIVE
Influenza B by PCR: NEGATIVE
SARS Coronavirus 2 by RT PCR: NEGATIVE

## 2019-02-16 LAB — COMPREHENSIVE METABOLIC PANEL
ALT: 15 U/L (ref 0–44)
AST: 14 U/L — ABNORMAL LOW (ref 15–41)
Albumin: 3.1 g/dL — ABNORMAL LOW (ref 3.5–5.0)
Alkaline Phosphatase: 187 U/L — ABNORMAL HIGH (ref 38–126)
Anion gap: 24 — ABNORMAL HIGH (ref 5–15)
BUN: 93 mg/dL — ABNORMAL HIGH (ref 8–23)
CO2: 20 mmol/L — ABNORMAL LOW (ref 22–32)
Calcium: 9.2 mg/dL (ref 8.9–10.3)
Chloride: 92 mmol/L — ABNORMAL LOW (ref 98–111)
Creatinine, Ser: 11.4 mg/dL — ABNORMAL HIGH (ref 0.61–1.24)
GFR calc Af Amer: 5 mL/min — ABNORMAL LOW (ref >60)
GFR calc non Af Amer: 4 mL/min — ABNORMAL LOW (ref >60)
Glucose, Bld: 296 mg/dL — ABNORMAL HIGH (ref 70–99)
Potassium: 4.6 mmol/L (ref 3.5–5.1)
Sodium: 136 mmol/L (ref 135–145)
Total Bilirubin: 0.4 mg/dL (ref 0.3–1.2)
Total Protein: 7.6 g/dL (ref 6.5–8.1)

## 2019-02-16 LAB — POCT I-STAT 7, (LYTES, BLD GAS, ICA,H+H)
Acid-base deficit: 2 mmol/L (ref 0.0–2.0)
Bicarbonate: 23 mmol/L (ref 20.0–28.0)
Calcium, Ion: 1.09 mmol/L — ABNORMAL LOW (ref 1.15–1.40)
HCT: 29 % — ABNORMAL LOW (ref 39.0–52.0)
Hemoglobin: 9.9 g/dL — ABNORMAL LOW (ref 13.0–17.0)
O2 Saturation: 98 %
Potassium: 4.5 mmol/L (ref 3.5–5.1)
Sodium: 133 mmol/L — ABNORMAL LOW (ref 135–145)
TCO2: 24 mmol/L (ref 22–32)
pCO2 arterial: 40.8 mmHg (ref 32.0–48.0)
pH, Arterial: 7.36 (ref 7.350–7.450)
pO2, Arterial: 109 mmHg — ABNORMAL HIGH (ref 83.0–108.0)

## 2019-02-16 LAB — CBC WITH DIFFERENTIAL/PLATELET
Abs Immature Granulocytes: 0.07 10*3/uL (ref 0.00–0.07)
Basophils Absolute: 0.1 10*3/uL (ref 0.0–0.1)
Basophils Relative: 1 %
Eosinophils Absolute: 0.1 10*3/uL (ref 0.0–0.5)
Eosinophils Relative: 1 %
HCT: 32 % — ABNORMAL LOW (ref 39.0–52.0)
Hemoglobin: 9.1 g/dL — ABNORMAL LOW (ref 13.0–17.0)
Immature Granulocytes: 1 %
Lymphocytes Relative: 14 %
Lymphs Abs: 1.3 10*3/uL (ref 0.7–4.0)
MCH: 23.5 pg — ABNORMAL LOW (ref 26.0–34.0)
MCHC: 28.4 g/dL — ABNORMAL LOW (ref 30.0–36.0)
MCV: 82.5 fL (ref 80.0–100.0)
Monocytes Absolute: 1 10*3/uL (ref 0.1–1.0)
Monocytes Relative: 10 %
Neutro Abs: 6.9 10*3/uL (ref 1.7–7.7)
Neutrophils Relative %: 73 %
Platelets: 301 10*3/uL (ref 150–400)
RBC: 3.88 MIL/uL — ABNORMAL LOW (ref 4.22–5.81)
RDW: 18.6 % — ABNORMAL HIGH (ref 11.5–15.5)
WBC: 9.4 10*3/uL (ref 4.0–10.5)
nRBC: 0 % (ref 0.0–0.2)

## 2019-02-16 LAB — TROPONIN I (HIGH SENSITIVITY)
Troponin I (High Sensitivity): 20 ng/L — ABNORMAL HIGH (ref <18)
Troponin I (High Sensitivity): 26 ng/L — ABNORMAL HIGH (ref <18)

## 2019-02-16 LAB — CBG MONITORING, ED: Glucose-Capillary: 298 mg/dL — ABNORMAL HIGH (ref 70–99)

## 2019-02-16 LAB — PROTIME-INR
INR: 2.7 — ABNORMAL HIGH (ref 0.8–1.2)
Prothrombin Time: 28.3 seconds — ABNORMAL HIGH (ref 11.4–15.2)

## 2019-02-16 LAB — HIV ANTIBODY (ROUTINE TESTING W REFLEX): HIV Screen 4th Generation wRfx: NONREACTIVE

## 2019-02-16 LAB — BRAIN NATRIURETIC PEPTIDE: B Natriuretic Peptide: 1158.6 pg/mL — ABNORMAL HIGH (ref 0.0–100.0)

## 2019-02-16 LAB — GLUCOSE, CAPILLARY: Glucose-Capillary: 279 mg/dL — ABNORMAL HIGH (ref 70–99)

## 2019-02-16 MED ORDER — SODIUM CHLORIDE 0.9% FLUSH
3.0000 mL | Freq: Two times a day (BID) | INTRAVENOUS | Status: DC
  Administered 2019-02-16: 3 mL via INTRAVENOUS

## 2019-02-16 MED ORDER — DOCUSATE SODIUM 283 MG RE ENEM
1.0000 | ENEMA | RECTAL | Status: DC | PRN
  Filled 2019-02-16: qty 1

## 2019-02-16 MED ORDER — HEPARIN SODIUM (PORCINE) 5000 UNIT/ML IJ SOLN: 5000.0000 [IU] | Freq: Three times a day (TID) | INTRAMUSCULAR | Status: DC

## 2019-02-16 MED ORDER — PANTOPRAZOLE SODIUM 40 MG PO TBEC
40.0000 mg | DELAYED_RELEASE_TABLET | Freq: Every day | ORAL | Status: DC
  Administered 2019-02-17: 40 mg via ORAL
  Filled 2019-02-16: qty 1

## 2019-02-16 MED ORDER — CAMPHOR-MENTHOL 0.5-0.5 % EX LOTN
1.0000 "application " | TOPICAL_LOTION | Freq: Three times a day (TID) | CUTANEOUS | Status: DC | PRN
  Filled 2019-02-16: qty 222

## 2019-02-16 MED ORDER — NEPRO/CARBSTEADY PO LIQD
237.0000 mL | Freq: Three times a day (TID) | ORAL | Status: DC | PRN
  Filled 2019-02-16: qty 237

## 2019-02-16 MED ORDER — CALCIUM CARBONATE ANTACID 1250 MG/5ML PO SUSP
500.0000 mg | Freq: Four times a day (QID) | ORAL | Status: DC | PRN
  Filled 2019-02-16: qty 5

## 2019-02-16 MED ORDER — FEBUXOSTAT 40 MG PO TABS
40.0000 mg | ORAL_TABLET | Freq: Every day | ORAL | Status: DC
  Administered 2019-02-17: 40 mg via ORAL
  Filled 2019-02-16: qty 1

## 2019-02-16 MED ORDER — GABAPENTIN 100 MG PO CAPS
100.0000 mg | ORAL_CAPSULE | Freq: Every day | ORAL | Status: DC
  Administered 2019-02-16: 100 mg via ORAL
  Filled 2019-02-16: qty 1

## 2019-02-16 MED ORDER — WARFARIN SODIUM 2.5 MG PO TABS
2.5000 mg | ORAL_TABLET | Freq: Once | ORAL | Status: AC
  Administered 2019-02-16: 2.5 mg via ORAL
  Filled 2019-02-16: qty 1

## 2019-02-16 MED ORDER — WARFARIN - PHARMACIST DOSING INPATIENT: Freq: Every day | Status: DC

## 2019-02-16 MED ORDER — CALCIUM ACETATE (PHOS BINDER) 667 MG PO CAPS
667.0000 mg | ORAL_CAPSULE | Freq: Three times a day (TID) | ORAL | Status: DC
  Administered 2019-02-17: 667 mg via ORAL
  Filled 2019-02-16 (×2): qty 1

## 2019-02-16 MED ORDER — ZOLPIDEM TARTRATE 5 MG PO TABS: 5.0000 mg | ORAL_TABLET | Freq: Every evening | ORAL | Status: DC | PRN

## 2019-02-16 MED ORDER — ACETAMINOPHEN 325 MG PO TABS: 650.0000 mg | ORAL_TABLET | Freq: Four times a day (QID) | ORAL | Status: DC | PRN

## 2019-02-16 MED ORDER — EZETIMIBE 10 MG PO TABS
10.0000 mg | ORAL_TABLET | Freq: Every day | ORAL | Status: DC
  Administered 2019-02-17: 10 mg via ORAL
  Filled 2019-02-16: qty 1

## 2019-02-16 MED ORDER — VERAPAMIL HCL ER 120 MG PO TBCR
120.0000 mg | EXTENDED_RELEASE_TABLET | Freq: Two times a day (BID) | ORAL | Status: DC
  Administered 2019-02-16: 120 mg via ORAL
  Filled 2019-02-16 (×4): qty 1

## 2019-02-16 MED ORDER — ATORVASTATIN CALCIUM 40 MG PO TABS
40.0000 mg | ORAL_TABLET | Freq: Every day | ORAL | Status: DC
  Administered 2019-02-17: 40 mg via ORAL
  Filled 2019-02-16: qty 1

## 2019-02-16 MED ORDER — HYDROXYZINE HCL 25 MG PO TABS: 25.0000 mg | ORAL_TABLET | Freq: Three times a day (TID) | ORAL | Status: DC | PRN

## 2019-02-16 MED ORDER — ACETAMINOPHEN 650 MG RE SUPP: 650.0000 mg | Freq: Four times a day (QID) | RECTAL | Status: DC | PRN

## 2019-02-16 MED ORDER — AEROCHAMBER PLUS FLO-VU LARGE MISC
Status: AC
  Filled 2019-02-16: qty 1

## 2019-02-16 MED ORDER — ONDANSETRON HCL 4 MG PO TABS: 4.0000 mg | ORAL_TABLET | Freq: Four times a day (QID) | ORAL | Status: DC | PRN

## 2019-02-16 MED ORDER — SORBITOL 70 % SOLN
30.0000 mL | Status: DC | PRN
  Filled 2019-02-16: qty 30

## 2019-02-16 MED ORDER — IPRATROPIUM BROMIDE HFA 17 MCG/ACT IN AERS
2.0000 | INHALATION_SPRAY | Freq: Once | RESPIRATORY_TRACT | Status: AC
  Administered 2019-02-16: 2 via RESPIRATORY_TRACT
  Filled 2019-02-16: qty 12.9

## 2019-02-16 MED ORDER — ALBUTEROL SULFATE HFA 108 (90 BASE) MCG/ACT IN AERS
6.0000 | INHALATION_SPRAY | Freq: Once | RESPIRATORY_TRACT | Status: AC
  Administered 2019-02-16: 08:00:00 6 via RESPIRATORY_TRACT
  Filled 2019-02-16: qty 6.7

## 2019-02-16 MED ORDER — ASPIRIN 81 MG PO CHEW
324.0000 mg | CHEWABLE_TABLET | Freq: Once | ORAL | Status: AC
  Administered 2019-02-16: 324 mg via ORAL
  Filled 2019-02-16: qty 4

## 2019-02-16 MED ORDER — METOPROLOL TARTRATE 100 MG PO TABS
100.0000 mg | ORAL_TABLET | Freq: Two times a day (BID) | ORAL | Status: DC
  Administered 2019-02-16: 100 mg via ORAL
  Filled 2019-02-16: qty 1

## 2019-02-16 MED ORDER — INSULIN ASPART 100 UNIT/ML ~~LOC~~ SOLN: 0.0000 [IU] | Freq: Three times a day (TID) | SUBCUTANEOUS | Status: DC

## 2019-02-16 MED ORDER — ONDANSETRON HCL 4 MG/2ML IJ SOLN: 4.0000 mg | Freq: Four times a day (QID) | INTRAMUSCULAR | Status: DC | PRN

## 2019-02-16 MED ORDER — FUROSEMIDE 10 MG/ML IJ SOLN
80.0000 mg | Freq: Once | INTRAMUSCULAR | Status: AC
  Administered 2019-02-16: 80 mg via INTRAVENOUS
  Filled 2019-02-16: qty 8

## 2019-02-16 MED ORDER — FUROSEMIDE 80 MG PO TABS
120.0000 mg | ORAL_TABLET | Freq: Two times a day (BID) | ORAL | Status: DC
  Administered 2019-02-16: 120 mg via ORAL
  Filled 2019-02-16: qty 6

## 2019-02-16 MED ORDER — LANTHANUM CARBONATE 500 MG PO CHEW
2000.0000 mg | CHEWABLE_TABLET | Freq: Three times a day (TID) | ORAL | Status: DC
  Administered 2019-02-17: 2000 mg via ORAL
  Filled 2019-02-16 (×2): qty 4

## 2019-02-16 NOTE — ED Notes (Signed)
Ordered for IV team placed by previous RN. Pt is a difficult stick . Multople attempts to gain access for IV.

## 2019-02-16 NOTE — ED Notes (Signed)
Pt sitting up in recliner

## 2019-02-16 NOTE — Progress Notes (Signed)
Arrived to patient room and MD attempting to place PIV

## 2019-02-16 NOTE — ED Triage Notes (Signed)
Pt coming from dialysis, before receiving tx, was transported here. Complaints of shob, cough, and pt was 88% on 4L [place] at dailysis facility. EMS placed pt on 10L [place]. CBG was 424. HR a fib in 60s-80s (pt has hx), 135/75, 97.8 temp
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
## 2019-02-16 NOTE — ED Notes (Signed)
2 IV attempts by this RN. Both unsuccessful. IV team consult placed
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
## 2019-02-16 NOTE — ED Provider Notes (Signed)
Renal/Dr Schertz called back to say due to prolonged waits/demand for dialysis, patient should be admitted to medical service, as patient will not be able to be dialyzed until sometime tomorrow.   Hospitalists consulted for admission/obs.      Lajean Saver, MD 02/16/19 [phone]

## 2019-02-16 NOTE — Progress Notes (Signed)
Asked to see pt for SOB and vol overload.  Pt in 15L [place].  CXR w/ IS edema, pt not in distress.  Have put in orders for HD and then dc home, however, with other emergencies, pt may not get HD tonight but rather in the morning.  Will let ER MD know.   Kelly Splinter, MD 02/16/2019, 4:33 PM

## 2019-02-16 NOTE — ED Notes (Signed)
RN to RN report give to [place] on 53M with no questions or concerns.

## 2019-02-16 NOTE — ED Notes (Signed)
Pt originally came in with 15L nonrebreather, weaned down to 8L [place] maintaining sat @ 97-98%

## 2019-02-17 DIAGNOSIS — J9601 Acute respiratory failure with hypoxia: Secondary | ICD-10-CM

## 2019-02-17 LAB — CBC
HCT: 28.1 % — ABNORMAL LOW (ref 39.0–52.0)
Hemoglobin: 8.3 g/dL — ABNORMAL LOW (ref 13.0–17.0)
MCH: 23.6 pg — ABNORMAL LOW (ref 26.0–34.0)
MCHC: 29.5 g/dL — ABNORMAL LOW (ref 30.0–36.0)
MCV: 79.8 fL — ABNORMAL LOW (ref 80.0–100.0)
Platelets: 290 10*3/uL (ref 150–400)
RBC: 3.52 MIL/uL — ABNORMAL LOW (ref 4.22–5.81)
RDW: 18.3 % — ABNORMAL HIGH (ref 11.5–15.5)
WBC: 9.9 10*3/uL (ref 4.0–10.5)
nRBC: 0 % (ref 0.0–0.2)

## 2019-02-17 LAB — PROTIME-INR
INR: 3.1 — ABNORMAL HIGH (ref 0.8–1.2)
Prothrombin Time: 32 seconds — ABNORMAL HIGH (ref 11.4–15.2)

## 2019-02-17 LAB — GLUCOSE, CAPILLARY: Glucose-Capillary: 129 mg/dL — ABNORMAL HIGH (ref 70–99)

## 2019-02-17 LAB — BASIC METABOLIC PANEL
Anion gap: 23 — ABNORMAL HIGH (ref 5–15)
BUN: 107 mg/dL — ABNORMAL HIGH (ref 8–23)
CO2: 20 mmol/L — ABNORMAL LOW (ref 22–32)
Calcium: 9.1 mg/dL (ref 8.9–10.3)
Chloride: 90 mmol/L — ABNORMAL LOW (ref 98–111)
Creatinine, Ser: 12.75 mg/dL — ABNORMAL HIGH (ref 0.61–1.24)
GFR calc Af Amer: 4 mL/min — ABNORMAL LOW (ref >60)
GFR calc non Af Amer: 4 mL/min — ABNORMAL LOW (ref >60)
Glucose, Bld: 203 mg/dL — ABNORMAL HIGH (ref 70–99)
Potassium: 4.7 mmol/L (ref 3.5–5.1)
Sodium: 133 mmol/L — ABNORMAL LOW (ref 135–145)

## 2019-02-17 LAB — MRSA PCR SCREENING: MRSA by PCR: NEGATIVE

## 2019-02-17 LAB — HEPATITIS B SURFACE ANTIBODY,QUALITATIVE: Hep B S Ab: REACTIVE — AB

## 2019-02-17 LAB — HEPATITIS B SURFACE ANTIGEN: Hepatitis B Surface Ag: NONREACTIVE

## 2019-02-17 MED ORDER — WARFARIN SODIUM 2.5 MG PO TABS: 2.5000 mg | ORAL_TABLET | Freq: Once | ORAL | Status: DC

## 2019-02-17 MED ORDER — CAMPHOR-MENTHOL 0.5-0.5 % EX LOTN: 1.0000 "application " | TOPICAL_LOTION | Freq: Three times a day (TID) | CUTANEOUS | 0 refills | Status: AC | PRN

## 2019-02-17 NOTE — Progress Notes (Signed)
DISCHARGE NOTE HOME Mahdi Haltiwanger Feil to be discharged Home per MD order. Discussed prescriptions and follow up appointments with the patient. Prescriptions given to patient; medication list explained in detail. Patient verbalized understanding.  Skin clean, dry and intact without evidence of skin break down, no evidence of skin tears noted. IV catheter discontinued intact. Site without signs and symptoms of complications. Dressing and pressure applied. Pt denies pain at the site currently. No complaints noted.  Patient free of lines, drains, and wounds.   An After Visit Summary (AVS) was printed and given to the patient. Patient escorted via wheelchair, and discharged home via private auto.  Orville Govern, RN3

## 2019-02-17 NOTE — Evaluation (Signed)
Physical Therapy Evaluation Patient Details Name: Kevin James MRN: [id number] DOB: Jun 30, 1955 Today's Date: 02/17/2019   History of Present Illness  Pt is a 64 y/o male admitted secondary to increased shortness of breath from volume overload. Pt also with R knee pain. PMH includes ESRD on HD, DM, COPD, OSA on CPAP, HTN, and afib.   Clinical Impression  Pt admitted secondary to problem above with deficits below. Pt requiring min to min guard A for mobility. Reported R knee "locking up" prior to standing, but resolved once up and ambulating. Feel pt would benefit from outpatient PT services to address deficits. Educated about using RW at home to increase safety. Will continue to follow acutely to maximize functional mobility independence and safety.     Follow Up Recommendations Outpatient PT;Supervision for mobility/OOB(prefers outpatient PT in [facility])    Equipment Recommendations  None recommended by PT    Recommendations for Other Services       Precautions / Restrictions Precautions Precautions: Fall Restrictions Weight Bearing Restrictions: No      Mobility  Bed Mobility Overal bed mobility: Needs Assistance Bed Mobility: Supine to Sit     Supine to sit: Min assist     General bed mobility comments: Min A for trunk elevation.   Transfers Overall transfer level: Needs assistance Equipment used: Rolling walker (2 wheeled) Transfers: Sit to/from Stand Sit to Stand: Min assist         General transfer comment: Min A for steadying assist. Required 2 attempts to stand secondary to reports of R knee "locking up". Cues for safe hand placement.   Ambulation/Gait Ambulation/Gait assistance: Min guard Gait Distance (Feet): 100 Feet Assistive device: Rolling walker (2 wheeled) Gait Pattern/deviations: Step-through pattern;Decreased stride length Gait velocity: Decreased   General Gait Details: Slow, cautious gait. Mild unsteadiness noted, however, no overt LOB  noted. Educated about using RW at home to increase stability.   Stairs            Wheelchair Mobility    Modified Rankin (Stroke Patients Only)       Balance Overall balance assessment: Needs assistance Sitting-balance support: No upper extremity supported;Feet supported Sitting balance-Leahy Scale: Fair     Standing balance support: Bilateral upper extremity supported;During functional activity Standing balance-Leahy Scale: Poor Standing balance comment: Reliant on BUE support                              Pertinent Vitals/Pain Pain Assessment: Faces Faces Pain Scale: Hurts little more Pain Location: R knee Pain Descriptors / Indicators: Grimacing;Guarding Pain Intervention(s): Repositioned;Monitored during session;Limited activity within patient's tolerance    Home Living Family/patient expects to be discharged to:: Private residence Living Arrangements: Spouse/significant other Available Help at Discharge: Family;Available 24 hours/day Type of Home: House Home Access: Stairs to enter Entrance Stairs-Rails: Psychiatric nurse of Steps: 4 Home Layout: One level Home Equipment: Environmental consultant - 2 wheels      Prior Function Level of Independence: Independent               Hand Dominance        Extremity/Trunk Assessment   Upper Extremity Assessment Upper Extremity Assessment: Overall WFL for tasks assessed    Lower Extremity Assessment Lower Extremity Assessment: Generalized weakness;RLE deficits/detail RLE Deficits / Details: R knee pain at baseline     Cervical / Trunk Assessment Cervical / Trunk Assessment: Normal  Communication   Communication: No difficulties  Cognition  Arousal/Alertness: Awake/alert Behavior During Therapy: WFL for tasks assessed/performed Overall Cognitive Status: Within Functional Limits for tasks assessed                                        General Comments General comments  (skin integrity, edema, etc.): Oxygen sats at 97% on RA following gait.     Exercises     Assessment/Plan    PT Assessment Patient needs continued PT services  PT Problem List Decreased strength;Decreased balance;Decreased mobility;Decreased activity tolerance;Decreased knowledge of use of DME       PT Treatment Interventions Gait training;DME instruction;Stair training;Functional mobility training;Therapeutic activities;Therapeutic exercise;Balance training;Patient/family education    PT Goals (Current goals can be found in the Care Plan section)  Acute Rehab PT Goals Patient Stated Goal: to go home PT Goal Formulation: With patient Time For Goal Achievement: 03/03/19 Potential to Achieve Goals: Good    Frequency Min 3X/week   Barriers to discharge        Co-evaluation               AM-PAC PT "6 Clicks" Mobility  Outcome Measure Help needed turning from your back to your side while in a flat bed without using bedrails?: A Little Help needed moving from lying on your back to sitting on the side of a flat bed without using bedrails?: A Little Help needed moving to and from a bed to a chair (including a wheelchair)?: A Little Help needed standing up from a chair using your arms (e.g., wheelchair or bedside chair)?: A Little Help needed to walk in hospital room?: A Little Help needed climbing 3-5 steps with a railing? : A Little 6 Click Score: 18    End of Session Equipment Utilized During Treatment: Gait belt Activity Tolerance: Patient tolerated treatment well Patient left: in bed;with call bell/phone within reach;with family/visitor present;with nursing/sitter in room(sitting EOB ) Nurse Communication: Mobility status PT Visit Diagnosis: Unsteadiness on feet (R26.81);Muscle weakness (generalized) (M62.81)    Time: [id number] PT Time Calculation (min) (ACUTE ONLY): 23 min   Charges:   PT Evaluation $PT Eval Low Complexity: 1 Low PT Treatments $Gait Training:  8-22 mins        Lou Miner, DPT  Acute Rehabilitation Services  Pager: [phone] Office: [phone]   Kevin James 02/17/2019, 3:29 PM

## 2019-02-17 NOTE — Care Management Obs Status (Signed)
[facility] NOTIFICATION   Patient Details  Name: SEVAK CHENNAULT MRN: [id number] Date of Birth: [birth year]   Medicare Observation Status Notification Given:  Yes    Bartholomew Crews, RN 02/17/2019, 5:37 PM

## 2019-02-17 NOTE — Progress Notes (Signed)
SATURATION QUALIFICATIONS: (This note is used to comply with regulatory documentation for home oxygen)  Patient Saturations on Room Air at Rest = 94%  Patient Saturations on Room Air while Ambulating = 97%
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
## 2019-02-17 NOTE — TOC Transition Note (Signed)
Transition of Care [facility]) - CM/SW Discharge Note   Patient Details  Name: COLMAN WHALE MRN: [id number] Date of Birth: 11/21/1955  [facility] CM/SW Contact:  Bartholomew Crews, RN Phone Number: [phone] 02/17/2019, 3:49 PM   Clinical Narrative:    Notified by PT of recommendations for outpatient PT and that patient wanted a place in [place]. Found facility [facility]. Referral received from MD and faxed to [facility]. Patient in agreement. NCM follow up with [facility] [place] who accepted referral. Spouse at bedside, and patient states that his ride will be there 5:30-6 pm to transport him home. No further TOC needs identified at this time.    Final next level of care: OP Rehab Barriers to Discharge: No Barriers Identified   Patient Goals and CMS Choice Patient states their goals for this hospitalization and ongoing recovery are:: return home with wife CMS Medicare.gov Compare Post Acute Care list provided to:: Patient Choice offered to / list presented to : Patient, Spouse  Discharge Placement                       Discharge Plan and Services                DME Arranged: N/A DME Agency: NA       HH Arranged: NA HH Agency: NA        Social Determinants of Health (SDOH) Interventions     Readmission Risk Interventions No flowsheet data found.

## 2019-02-17 NOTE — Procedures (Signed)
Pt seen on HD this am.  Has sig pretib edema and SOB, getting HD now w/ max UF as will tolerate. No distress.   I was present at this dialysis session, have reviewed the session itself and made  appropriate changes Kelly Splinter MD [place] pager [phone]   02/17/2019, 4:58 PM

## 2019-02-17 NOTE — Discharge Summary (Signed)
Physician Discharge Summary  Kevin James HAL:[id number] DOB: July 14, 1955 DOA: 02/16/2019  PCP: Crist Infante, MD  Admit date: 02/16/2019 Discharge date: 02/17/2019  Admitted From: home Discharge disposition: home   Recommendations for Outpatient Follow-Up:   1. Continue regularly scheduled dialysis 2. Follow up with PCP 1-2 weeks for evaluation of diabetes control    Discharge Diagnosis:   Principal Problem:   Acute respiratory failure with hypoxia (HCC) Active Problems:   Diabetes mellitus with renal manifestation (HCC)   ESRD (end stage renal disease) on dialysis (HCC)   Volume overload   PAF (paroxysmal atrial fibrillation) (HCC)   Essential hypertension   Obesity   OSA on CPAP   Dyslipidemia    Discharge Condition: Improved.  Diet recommendation: Low sodium, heart healthy.  Carbohydrate-modified.   Wound care: None.  Code status: Full.   History of Present Illness:   Kevin James is a 64 y.o. male with medical history significant of DM; afib on Xarelto; OSA; on CPAP; obesity (BMI 37); HLD; HTN; ESRD on MWF HD; and COPD presented 2/8 with SOB associated with volume overload.  He was going to HD that AM and he was SOB.  At the center, he was shaking and his heart was "doing all kinds of crazy things."  They decided not to put him on the machine and sent him to the ER.  He reported compliance with HD and attended full sessions.  He was not sure why this happened.  Denied excessive salt intake.    Chart review indicates initially his oxygen saturation level was 82% on room air.  He was provided with oxygen supplementation and a breathing treatment and his oxygen saturation level went to 93% on 6 L.  He was admitted for acute respiratory failure with hypoxia in the setting of volume overload in a dialysis patient.    Hospital Course by Problem:   Acute respiratory failure with hypoxia/volume overload in an ESRD on HD patient.  In the emergency department  it was noted his oxygen saturation level was 82% on room air respiratory rate 31 times per minute.  Chest x-ray revealed interstitial edema.  He was provided with breathing treatment and evaluated by nephrology who recommended dialysis.  Work of breathing improved after breathing treatment.  He was provided with oxygen supplementation and oxygen saturation level 93% on 6 L.  Dialysis performed 02/17/2019.  Chart review indicates UF goal of 3 L met without problem.  Post weight 126.6 kg on standing scale.  His oxygen saturation level did drop to 89% on room air while weighing.  He was placed on 2 L nasal cannula and his oxygen saturation level went to 94%.It is not clear why he was volume overloaded at the time of presentation since he has been attending his usual HD sessions; likely this was related to dietary indiscretion.  He was educated to a low-salt diet and monitoring his fluid intake.  At the time of discharge he ambulated in the hall on room air is oxygen saturation level was 97%.  He is to resume his Monday Wednesday Friday dialysis schedule  PAF -Rate controlled with verapamil, Lopressor -Continue Coumadin  DM Remote last A1c (6.0 in 2016).  Hemoglobin A1c is pending at the time of discharge.  Patient does report he had an appointment today with his primary care provider to discuss the initiation of insulin into his diabetes control regimen.  I have instructed the patient on monitoring his blood sugars daily  and following up with his PCP within 1 week.  HTn blood pressure on the low end of normal particularly during dialysis.  At the time of discharge his blood pressure is 122/70.  We will continue his home medications at discharge  OSA on CPAP -Continue CPAP  HLD -Continue Lipitor, Zetia  Obesity -BMI 37    Medical Consultants:   [facility]   Discharge Exam:   Vitals:   02/17/19 1153 02/17/19 1211  BP: (!) 99/45 (!) 110/53  Pulse: 85 78  Resp: 15 16  Temp: 98.1  F (36.7 C)   SpO2: (!) 88% 94%   Vitals:   02/17/19 1130 02/17/19 1141 02/17/19 1153 02/17/19 1211  BP: (!) 123/57  (!) 99/45 (!) 110/53  Pulse: 79  85 78  Resp: '14  15 16  ' Temp:   98.1 F (36.7 C)   TempSrc:   Oral   SpO2:  91% (!) 88% 94%  Weight:   126.6 kg   Height:        General exam: Appears calm and comfortable.  Patient is quite obese slightly pale Respiratory system: Clear to auscultation. Respiratory effort normal.  Respirations slightly shallow no increased work of breathing.  I suspect his abdominal girth attributing to patient's he of respirations Cardiovascular system: S1 & S2 heard, RRR. No JVD,  rubs, gallops or clicks. No murmurs. Gastrointestinal system: Abdomen is nondistended, soft and nontender. No organomegaly or masses felt. Normal bowel sounds heard. Central nervous system: Alert and oriented. No focal neurological deficits. Extremities: No clubbing,  or cyanosis. No edema. Skin: No rashes, lesions or ulcers. Psychiatry: Judgement and insight appear normal. Mood & affect appropriate.    The results of significant diagnostics from this hospitalization (including imaging, microbiology, ancillary and laboratory) are listed below for reference.     Procedures and Diagnostic Studies:   DG Chest Portable 1 View  Result Date: 02/16/2019 CLINICAL DATA:  64 year old male with shortness of breath chest pain and cough. COVID-19 status pending. EXAM: PORTABLE CHEST 1 VIEW COMPARISON:  Chest radiographs 08/26/2015 and earlier. FINDINGS: Portable AP semi upright view at 0652 hours. Right IJ approach dual lumen dialysis type catheter removed since 2017. Stable cardiac size and mediastinal contours. Visualized tracheal air column is within normal limits. Calcified aortic atherosclerosis. New bilateral peripheral and basilar predominant diffuse increased interstitial markings. No pneumothorax. No definite pleural effusion. No consolidation. No acute osseous abnormality  identified. IMPRESSION: New bilateral interstitial opacity with basilar predominance. Top differential considerations include pulmonary edema and viral/atypical respiratory infection. Electronically Signed   By: Genevie Ann M.D.   On: 02/16/2019 07:17     Labs:   Basic Metabolic Panel: Recent Labs  Lab 02/16/19 0716 02/16/19 0716 02/16/19 0737 02/17/19 0640  NA 136  --  133* 133*  K 4.6   < > 4.5 4.7  CL 92*  --   --  90*  CO2 20*  --   --  20*  GLUCOSE 296*  --   --  203*  BUN 93*  --   --  107*  CREATININE 11.40*  --   --  12.75*  CALCIUM 9.2  --   --  9.1   < > = values in this interval not displayed.   GFR Estimated Creatinine Clearance: 8.4 mL/min (A) (by C-G formula based on SCr of 12.75 mg/dL (H)). Liver Function Tests: Recent Labs  Lab 02/16/19 0716  AST 14*  ALT 15  ALKPHOS 187*  BILITOT 0.4  PROT 7.6  ALBUMIN 3.1*   No results for input(s): LIPASE, AMYLASE in the last 168 hours. No results for input(s): AMMONIA in the last 168 hours. Coagulation profile Recent Labs  Lab 02/12/19 0936 02/16/19 0716 02/17/19 0640  INR 1.8* 2.7* 3.1*    CBC: Recent Labs  Lab 02/16/19 0716 02/16/19 0737 02/17/19 0640  WBC 9.4  --  9.9  NEUTROABS 6.9  --   --   HGB 9.1* 9.9* 8.3*  HCT 32.0* 29.0* 28.1*  MCV 82.5  --  79.8*  PLT 301  --  290   Cardiac Enzymes: No results for input(s): CKTOTAL, CKMB, CKMBINDEX, TROPONINI in the last 168 hours. BNP: Invalid input(s): POCBNP CBG: Recent Labs  Lab 02/16/19 0614 02/16/19 2020 02/17/19 1233  GLUCAP 298* 279* 129*   D-Dimer No results for input(s): DDIMER in the last 72 hours. Hgb A1c No results for input(s): HGBA1C in the last 72 hours. Lipid Profile No results for input(s): CHOL, HDL, LDLCALC, TRIG, CHOLHDL, LDLDIRECT in the last 72 hours. Thyroid function studies No results for input(s): TSH, T4TOTAL, T3FREE, THYROIDAB in the last 72 hours.  Invalid input(s): FREET3 Anemia work up No results for input(s):  VITAMINB12, FOLATE, FERRITIN, TIBC, IRON, RETICCTPCT in the last 72 hours. Microbiology Recent Results (from the past 240 hour(s))  Respiratory Panel by RT PCR (Flu A&B, Covid) - Nasopharyngeal Swab     Status: None   Collection Time: 02/16/19  9:10 AM   Specimen: Nasopharyngeal Swab  Result Value Ref Range Status   SARS Coronavirus 2 by RT PCR NEGATIVE NEGATIVE Final    Comment: (NOTE) SARS-CoV-2 target nucleic acids are NOT DETECTED. The SARS-CoV-2 RNA is generally detectable in upper respiratoy specimens during the acute phase of infection. The lowest concentration of SARS-CoV-2 viral copies this assay can detect is 131 copies/mL. A negative result does not preclude SARS-Cov-2 infection and should not be used as the sole basis for treatment or other patient management decisions. A negative result may occur with  improper specimen collection/handling, submission of specimen other than nasopharyngeal swab, presence of viral mutation(s) within the areas targeted by this assay, and inadequate number of viral copies (<131 copies/mL). A negative result must be combined with clinical observations, patient history, and epidemiological information. The expected result is Negative. Fact Sheet for Patients:  PinkCheek.be Fact Sheet for Healthcare Providers:  GravelBags.it This test is not yet ap proved or cleared by the [place] FDA and  has been authorized for detection and/or diagnosis of SARS-CoV-2 by FDA under an Emergency Use Authorization (EUA). This EUA will remain  in effect (meaning this test can be used) for the duration of the COVID-19 declaration under Section 564(b)(1) of the Act, 21 U.S.C. section 360bbb-3(b)(1), unless the authorization is terminated or revoked sooner.    Influenza A by PCR NEGATIVE NEGATIVE Final   Influenza B by PCR NEGATIVE NEGATIVE Final    Comment: (NOTE) The Xpert Xpress SARS-CoV-2/FLU/RSV  assay is intended as an aid in  the diagnosis of influenza from Nasopharyngeal swab specimens and  should not be used as a sole basis for treatment. Nasal washings and  aspirates are unacceptable for Xpert Xpress SARS-CoV-2/FLU/RSV  testing. Fact Sheet for Patients: PinkCheek.be Fact Sheet for Healthcare Providers: GravelBags.it This test is not yet approved or cleared by the [place] FDA and  has been authorized for detection and/or diagnosis of SARS-CoV-2 by  FDA under an Emergency Use Authorization (EUA). This EUA will remain  in effect (meaning  this test can be used) for the duration of the  Covid-19 declaration under Section 564(b)(1) of the Act, 21  U.S.C. section 360bbb-3(b)(1), unless the authorization is  terminated or revoked. Performed at [facility], [place] [address], [place], [place] [address]   MRSA PCR Screening     Status: None   Collection Time: 02/17/19 12:52 AM   Specimen: Nasal Mucosa; Nasopharyngeal  Result Value Ref Range Status   MRSA by PCR NEGATIVE NEGATIVE Final    Comment:        The GeneXpert MRSA Assay (FDA approved for NASAL specimens only), is one component of a comprehensive MRSA colonization surveillance program. It is not intended to diagnose MRSA infection nor to guide or monitor treatment for MRSA infections. Performed at [facility], [place] [address], [place], [place] [address]      Discharge Instructions:   Discharge Instructions    Ambulatory referral to Physical Therapy   Complete by: As directed    [facility], [address] [place], [place] [address]; [phone]   Call MD for:  difficulty breathing, headache or visual disturbances   Complete by: As directed    Call MD for:  extreme fatigue   Complete by: As directed    Call MD for:  persistant dizziness or light-headedness   Complete by: As directed    Call MD for:  severe  uncontrolled pain   Complete by: As directed    Call MD for:  temperature >100.4   Complete by: As directed    Diet - low sodium heart healthy   Complete by: As directed    Discharge instructions   Complete by: As directed    Take medications as prescribed Attend dialysis as scheduled Monitor blood sugars daily for 2 weeks and provide PCP with data. Of note, if you take prednisone that is on your med list as an "as needed" medication, it will drive your sugar up.  Outpatient PT has been ordered as well. Adhere to low carb diet Follow up with PCP in 1-2 weeks for evaluation of diabetes control and further education   Increase activity slowly   Complete by: As directed      Allergies as of 02/17/2019      Reactions   Grapefruit Concentrate Nausea And Vomiting   dizziness      Medication List    TAKE these medications   acetaminophen 650 MG CR tablet Commonly known as: TYLENOL Take 650 mg by mouth every 8 (eight) hours as needed for pain.   atorvastatin 40 MG tablet Commonly known as: LIPITOR Take 40 mg by mouth daily with lunch.   calcium acetate 667 MG capsule Commonly known as: PHOSLO Take 667 mg by mouth 3 (three) times daily with meals.   camphor-menthol lotion Commonly known as: SARNA Apply 1 application topically every 8 (eight) hours as needed for itching.   colchicine 0.6 MG tablet Take 0.6 mg by mouth daily as needed (gout pain).   docusate sodium 250 MG capsule Commonly known as: COLACE Take 250 mg by mouth 3 (three) times daily.   ezetimibe 10 MG tablet Commonly known as: ZETIA Take 1 tablet (10 mg total) by mouth daily.   febuxostat 40 MG tablet Commonly known as: ULORIC Take 40 mg by mouth daily with lunch.   Fish Oil 500 MG Caps Take 500 mg by mouth at bedtime.   furosemide 40 MG tablet Commonly known as: LASIX Take 120  mg by mouth 2 (two) times daily.   gabapentin 100 MG capsule Commonly known as: NEURONTIN Take 1 capsule (100 mg total) by  mouth at bedtime.   lanthanum 1000 MG chewable tablet Commonly known as: FOSRENOL Chew 2,000 mg by mouth 3 (three) times daily with meals.   metoprolol tartrate 50 MG tablet Commonly known as: LOPRESSOR Take 2 tablets (100 mg total) by mouth 2 (two) times daily.   pantoprazole 40 MG tablet Commonly known as: PROTONIX Take 40 mg by mouth daily.   predniSONE 20 MG tablet Commonly known as: DELTASONE Take 20 mg by mouth daily as needed (gout flare up).   verapamil 120 MG 24 hr capsule Commonly known as: VERELAN PM Take 120 mg by mouth 2 (two) times daily.   warfarin 5 MG tablet Commonly known as: Coumadin Take as directed. If you are unsure how to take this medication, talk to your nurse or doctor. Original instructions: Take 1/2 to 1 tablet by mouth daily as directed by Coumadin clinic. What changed:   how much to take  how to take this  when to take this  additional instructions         Time coordinating discharge: 45 minutes  Signed:  Radene Gunning NP  [facility] 02/17/2019, 2:31 PM

## 2019-02-17 NOTE — Progress Notes (Signed)
Tolerated well. UF goal of 3L met without issue. Some cramping in right leg resulting in reduction of uf goal. Post weight 126.6kg on standing scale. Sats 88-89% on room air, pt remains short of breath when standing up to weigh. Placed back on nasal cannula at 1L, sats currently 94%. Reported off to primary RN.

## 2019-02-18 LAB — HEMOGLOBIN A1C
Hgb A1c MFr Bld: 9.5 % — ABNORMAL HIGH (ref 4.8–5.6)
Mean Plasma Glucose: 226 mg/dL

## 2019-02-26 ENCOUNTER — Other Ambulatory Visit: Payer: Self-pay | Admitting: *Deleted

## 2019-02-26 DIAGNOSIS — R079 Chest pain, unspecified: Secondary | ICD-10-CM

## 2019-03-05 ENCOUNTER — Other Ambulatory Visit: Payer: Self-pay

## 2019-03-05 ENCOUNTER — Ambulatory Visit (INDEPENDENT_AMBULATORY_CARE_PROVIDER_SITE_OTHER): Payer: Medicare Other | Admitting: Pharmacist

## 2019-03-05 DIAGNOSIS — I480 Paroxysmal atrial fibrillation: Secondary | ICD-10-CM

## 2019-03-05 DIAGNOSIS — Z5181 Encounter for therapeutic drug level monitoring: Secondary | ICD-10-CM

## 2019-03-05 LAB — POCT INR: INR: 1.9 — AB (ref 2.0–3.0)

## 2019-03-05 NOTE — Patient Instructions (Signed)
Description   Start taking 1 tablet daily except 1/2 tablet on Mondays, Wednesdays, and Fridays. Recheck INR 4 weeks (pt cannot come sooner). [facility], call with changes in medication or procedures.

## 2019-03-09 ENCOUNTER — Telehealth: Payer: Self-pay | Admitting: Internal Medicine

## 2019-03-09 NOTE — Telephone Encounter (Signed)
New Message  Pt is calling back to receive his INR results  Please call

## 2019-03-09 NOTE — Telephone Encounter (Signed)
Patient called back. He states we called him on the 25th after his appointment. I do not have record of anyone calling him. Confirmed he knew his new dose and next INR appointment.

## 2019-03-09 NOTE — Telephone Encounter (Signed)
Called patient and left VM to return call. I do not have any recent labs on patient. Unsure what results he is calling for

## 2019-03-24 ENCOUNTER — Other Ambulatory Visit: Payer: Self-pay | Admitting: Internal Medicine

## 2019-03-30 ENCOUNTER — Telehealth (HOSPITAL_COMMUNITY): Payer: Self-pay

## 2019-03-30 NOTE — Telephone Encounter (Signed)
Spoke with the patient, detailed instructions given. He stated that he would be here for his test. He stated that he understood, and asked to call back with any questions. S.Hether Anselmo EMTP.

## 2019-03-31 ENCOUNTER — Other Ambulatory Visit: Payer: Self-pay

## 2019-03-31 ENCOUNTER — Ambulatory Visit (HOSPITAL_COMMUNITY): Payer: Medicare Other | Attending: Cardiology

## 2019-03-31 DIAGNOSIS — R079 Chest pain, unspecified: Secondary | ICD-10-CM | POA: Insufficient documentation

## 2019-03-31 MED ORDER — REGADENOSON 0.4 MG/5ML IV SOLN
0.4000 mg | Freq: Once | INTRAVENOUS | Status: AC
  Administered 2019-03-31: 0.4 mg via INTRAVENOUS

## 2019-03-31 MED ORDER — TECHNETIUM TC 99M TETROFOSMIN IV KIT
32.6000 | PACK | Freq: Once | INTRAVENOUS | Status: AC | PRN
  Administered 2019-03-31: 32.6 via INTRAVENOUS
  Filled 2019-03-31: qty 33

## 2019-04-02 ENCOUNTER — Ambulatory Visit (INDEPENDENT_AMBULATORY_CARE_PROVIDER_SITE_OTHER): Payer: Medicare Other

## 2019-04-02 ENCOUNTER — Other Ambulatory Visit: Payer: Self-pay

## 2019-04-02 ENCOUNTER — Encounter (HOSPITAL_COMMUNITY): Payer: Medicare Other

## 2019-04-02 ENCOUNTER — Ambulatory Visit (HOSPITAL_COMMUNITY): Payer: Medicare Other | Attending: Cardiology

## 2019-04-02 DIAGNOSIS — I480 Paroxysmal atrial fibrillation: Secondary | ICD-10-CM

## 2019-04-02 DIAGNOSIS — Z5181 Encounter for therapeutic drug level monitoring: Secondary | ICD-10-CM

## 2019-04-02 LAB — MYOCARDIAL PERFUSION IMAGING
LV dias vol: 78 mL (ref 62–150)
LV sys vol: 27 mL
Peak HR: 95 {beats}/min
Rest HR: 85 {beats}/min
SDS: 0
SRS: 0
SSS: 0
TID: 0.87

## 2019-04-02 LAB — POCT INR: INR: 1.4 — AB (ref 2.0–3.0)

## 2019-04-02 MED ORDER — TECHNETIUM TC 99M TETROFOSMIN IV KIT
31.9000 | PACK | Freq: Once | INTRAVENOUS | Status: AC | PRN
  Administered 2019-04-02: 31.9 via INTRAVENOUS
  Filled 2019-04-02: qty 32

## 2019-04-02 NOTE — Patient Instructions (Addendum)
Description   Take 1.5 tablets today and 1 tablet tomorrow, then start taking 1 tablet daily except 1/2 tablet on Mondays and Fridays. Recheck INR 2 weeks, pt refuses to return sooner than 4 weeks. Pt aware of risk of clot or stroke.  Fax results to PCP per pt request. [phone] Clinic, call with changes in medication or procedures.

## 2019-04-03 ENCOUNTER — Telehealth: Payer: Self-pay

## 2019-04-03 NOTE — Telephone Encounter (Signed)
The patient has been notified of the stress test result and verbalized understanding.  All questions (if any) were answered. Frederik Schmidt, RN 04/03/2019 8:16 AM

## 2019-04-03 NOTE — Telephone Encounter (Signed)
-----   Message from Dorris Carnes V, MD sent at 04/03/2019 12:54 AM EDT ----- Myovue shows no evidence of ischemia   Pumping funciton of the heart is normal    I do not think CP is due to blood flow problem in the heart   Keeep on same meds

## 2019-04-17 ENCOUNTER — Other Ambulatory Visit: Payer: Self-pay | Admitting: Neurology

## 2019-04-17 DIAGNOSIS — G4761 Periodic limb movement disorder: Secondary | ICD-10-CM

## 2019-04-17 DIAGNOSIS — G4733 Obstructive sleep apnea (adult) (pediatric): Secondary | ICD-10-CM

## 2019-04-17 DIAGNOSIS — I482 Chronic atrial fibrillation, unspecified: Secondary | ICD-10-CM

## 2019-04-17 DIAGNOSIS — G629 Polyneuropathy, unspecified: Secondary | ICD-10-CM

## 2019-04-17 DIAGNOSIS — G2581 Restless legs syndrome: Secondary | ICD-10-CM

## 2019-04-30 ENCOUNTER — Other Ambulatory Visit: Payer: Self-pay

## 2019-04-30 ENCOUNTER — Ambulatory Visit (INDEPENDENT_AMBULATORY_CARE_PROVIDER_SITE_OTHER): Payer: Medicare Other | Admitting: *Deleted

## 2019-04-30 DIAGNOSIS — I480 Paroxysmal atrial fibrillation: Secondary | ICD-10-CM | POA: Diagnosis not present

## 2019-04-30 DIAGNOSIS — Z5181 Encounter for therapeutic drug level monitoring: Secondary | ICD-10-CM

## 2019-04-30 LAB — POCT INR: INR: 1.6 — AB (ref 2.0–3.0)

## 2019-04-30 NOTE — Patient Instructions (Signed)
Description   Take 1.5 tablets today and 1 tablet tomorrow, then start taking 1 tablet daily except 1/2 tablet on  Fridays. Recheck INR 2 weeks, pt refuses to return sooner than 4 weeks. Pt aware of risk of clot or stroke.  Fax results to PCP per pt request. [phone] Clinic, call with changes in medication or procedures.

## 2019-05-22 ENCOUNTER — Other Ambulatory Visit: Payer: Self-pay

## 2019-05-22 ENCOUNTER — Inpatient Hospital Stay (HOSPITAL_COMMUNITY)
Admission: EM | Admit: 2019-05-22 | Discharge: 2019-05-24 | DRG: 602 | Disposition: A | Discharge status: Home Health | Payer: Medicare Other | Attending: Internal Medicine | Admitting: Internal Medicine

## 2019-05-22 ENCOUNTER — Emergency Department (HOSPITAL_COMMUNITY): Payer: Medicare Other

## 2019-05-22 ENCOUNTER — Encounter (HOSPITAL_COMMUNITY): Payer: Self-pay

## 2019-05-22 DIAGNOSIS — I10 Essential (primary) hypertension: Secondary | ICD-10-CM | POA: Diagnosis not present

## 2019-05-22 DIAGNOSIS — Z992 Dependence on renal dialysis: Secondary | ICD-10-CM

## 2019-05-22 DIAGNOSIS — M109 Gout, unspecified: Secondary | ICD-10-CM | POA: Diagnosis present

## 2019-05-22 DIAGNOSIS — Z87891 Personal history of nicotine dependence: Secondary | ICD-10-CM

## 2019-05-22 DIAGNOSIS — E66812 Obesity, class 2: Secondary | ICD-10-CM

## 2019-05-22 DIAGNOSIS — L0390 Cellulitis, unspecified: Secondary | ICD-10-CM | POA: Diagnosis present

## 2019-05-22 DIAGNOSIS — Z833 Family history of diabetes mellitus: Secondary | ICD-10-CM

## 2019-05-22 DIAGNOSIS — J449 Chronic obstructive pulmonary disease, unspecified: Secondary | ICD-10-CM | POA: Diagnosis present

## 2019-05-22 DIAGNOSIS — Z7901 Long term (current) use of anticoagulants: Secondary | ICD-10-CM

## 2019-05-22 DIAGNOSIS — Z9989 Dependence on other enabling machines and devices: Secondary | ICD-10-CM

## 2019-05-22 DIAGNOSIS — Z8249 Family history of ischemic heart disease and other diseases of the circulatory system: Secondary | ICD-10-CM

## 2019-05-22 DIAGNOSIS — S81802A Unspecified open wound, left lower leg, initial encounter: Secondary | ICD-10-CM | POA: Diagnosis present

## 2019-05-22 DIAGNOSIS — E872 Acidosis: Secondary | ICD-10-CM | POA: Diagnosis present

## 2019-05-22 DIAGNOSIS — E1122 Type 2 diabetes mellitus with diabetic chronic kidney disease: Secondary | ICD-10-CM | POA: Diagnosis present

## 2019-05-22 DIAGNOSIS — N186 End stage renal disease: Secondary | ICD-10-CM | POA: Diagnosis not present

## 2019-05-22 DIAGNOSIS — Z20822 Contact with and (suspected) exposure to covid-19: Secondary | ICD-10-CM | POA: Diagnosis present

## 2019-05-22 DIAGNOSIS — I120 Hypertensive chronic kidney disease with stage 5 chronic kidney disease or end stage renal disease: Secondary | ICD-10-CM | POA: Diagnosis present

## 2019-05-22 DIAGNOSIS — L03116 Cellulitis of left lower limb: Secondary | ICD-10-CM | POA: Diagnosis present

## 2019-05-22 DIAGNOSIS — G4733 Obstructive sleep apnea (adult) (pediatric): Secondary | ICD-10-CM

## 2019-05-22 DIAGNOSIS — I480 Paroxysmal atrial fibrillation: Secondary | ICD-10-CM | POA: Diagnosis present

## 2019-05-22 DIAGNOSIS — E782 Mixed hyperlipidemia: Secondary | ICD-10-CM | POA: Diagnosis present

## 2019-05-22 DIAGNOSIS — M1990 Unspecified osteoarthritis, unspecified site: Secondary | ICD-10-CM | POA: Diagnosis present

## 2019-05-22 DIAGNOSIS — K219 Gastro-esophageal reflux disease without esophagitis: Secondary | ICD-10-CM | POA: Diagnosis present

## 2019-05-22 DIAGNOSIS — Z6836 Body mass index (BMI) 36.0-36.9, adult: Secondary | ICD-10-CM

## 2019-05-22 DIAGNOSIS — H9190 Unspecified hearing loss, unspecified ear: Secondary | ICD-10-CM | POA: Diagnosis present

## 2019-05-22 DIAGNOSIS — Z794 Long term (current) use of insulin: Secondary | ICD-10-CM

## 2019-05-22 DIAGNOSIS — E1169 Type 2 diabetes mellitus with other specified complication: Secondary | ICD-10-CM | POA: Diagnosis present

## 2019-05-22 DIAGNOSIS — N2581 Secondary hyperparathyroidism of renal origin: Secondary | ICD-10-CM | POA: Diagnosis present

## 2019-05-22 DIAGNOSIS — Z91018 Allergy to other foods: Secondary | ICD-10-CM

## 2019-05-22 LAB — COMPREHENSIVE METABOLIC PANEL
ALT: 25 U/L (ref 0–44)
AST: 26 U/L (ref 15–41)
Albumin: 2.8 g/dL — ABNORMAL LOW (ref 3.5–5.0)
Alkaline Phosphatase: 154 U/L — ABNORMAL HIGH (ref 38–126)
Anion gap: 16 — ABNORMAL HIGH (ref 5–15)
BUN: 21 mg/dL (ref 8–23)
CO2: 27 mmol/L (ref 22–32)
Calcium: 9.3 mg/dL (ref 8.9–10.3)
Chloride: 93 mmol/L — ABNORMAL LOW (ref 98–111)
Creatinine, Ser: 3.73 mg/dL — ABNORMAL HIGH (ref 0.61–1.24)
GFR calc Af Amer: 19 mL/min — ABNORMAL LOW (ref >60)
GFR calc non Af Amer: 16 mL/min — ABNORMAL LOW (ref >60)
Glucose, Bld: 158 mg/dL — ABNORMAL HIGH (ref 70–99)
Potassium: 3.1 mmol/L — ABNORMAL LOW (ref 3.5–5.1)
Sodium: 136 mmol/L (ref 135–145)
Total Bilirubin: 0.8 mg/dL (ref 0.3–1.2)
Total Protein: 7.8 g/dL (ref 6.5–8.1)

## 2019-05-22 LAB — CBC WITH DIFFERENTIAL/PLATELET
Abs Immature Granulocytes: 0.03 10*3/uL (ref 0.00–0.07)
Basophils Absolute: 0.1 10*3/uL (ref 0.0–0.1)
Basophils Relative: 1 %
Eosinophils Absolute: 0.2 10*3/uL (ref 0.0–0.5)
Eosinophils Relative: 2 %
HCT: 34.8 % — ABNORMAL LOW (ref 39.0–52.0)
Hemoglobin: 9.7 g/dL — ABNORMAL LOW (ref 13.0–17.0)
Immature Granulocytes: 0 %
Lymphocytes Relative: 15 %
Lymphs Abs: 1.1 10*3/uL (ref 0.7–4.0)
MCH: 21.6 pg — ABNORMAL LOW (ref 26.0–34.0)
MCHC: 27.9 g/dL — ABNORMAL LOW (ref 30.0–36.0)
MCV: 77.5 fL — ABNORMAL LOW (ref 80.0–100.0)
Monocytes Absolute: 0.9 10*3/uL (ref 0.1–1.0)
Monocytes Relative: 13 %
Neutro Abs: 4.7 10*3/uL (ref 1.7–7.7)
Neutrophils Relative %: 69 %
Platelets: 335 10*3/uL (ref 150–400)
RBC: 4.49 MIL/uL (ref 4.22–5.81)
RDW: 23.2 % — ABNORMAL HIGH (ref 11.5–15.5)
WBC: 6.9 10*3/uL (ref 4.0–10.5)
nRBC: 0 % (ref 0.0–0.2)

## 2019-05-22 LAB — LACTIC ACID, PLASMA: Lactic Acid, Venous: 2.8 mmol/L (ref 0.5–1.9)

## 2019-05-22 LAB — CBG MONITORING, ED: Glucose-Capillary: 114 mg/dL — ABNORMAL HIGH (ref 70–99)

## 2019-05-22 MED ORDER — SODIUM CHLORIDE 0.9 % IV SOLN
1.0000 g | Freq: Once | INTRAVENOUS | Status: AC
  Administered 2019-05-22: 1 g via INTRAVENOUS
  Filled 2019-05-22: qty 1

## 2019-05-22 MED ORDER — POTASSIUM CHLORIDE CRYS ER 20 MEQ PO TBCR
20.0000 meq | EXTENDED_RELEASE_TABLET | Freq: Once | ORAL | Status: AC
  Administered 2019-05-22: 20 meq via ORAL
  Filled 2019-05-22: qty 1

## 2019-05-22 MED ORDER — HYDROCODONE-ACETAMINOPHEN 5-325 MG PO TABS
1.0000 | ORAL_TABLET | Freq: Once | ORAL | Status: AC
  Administered 2019-05-22: 1 via ORAL
  Filled 2019-05-22: qty 1

## 2019-05-22 MED ORDER — SODIUM CHLORIDE 0.9% FLUSH
3.0000 mL | Freq: Once | INTRAVENOUS | Status: AC
  Administered 2019-05-22: 3 mL via INTRAVENOUS

## 2019-05-22 MED ORDER — INSULIN ASPART 100 UNIT/ML ~~LOC~~ SOLN: 0.0000 [IU] | Freq: Three times a day (TID) | SUBCUTANEOUS | Status: DC

## 2019-05-22 MED ORDER — PIPERACILLIN-TAZOBACTAM 3.375 G IVPB 30 MIN: 3.3750 g | Freq: Once | INTRAVENOUS | Status: DC

## 2019-05-22 MED ORDER — OXYCODONE-ACETAMINOPHEN 5-325 MG PO TABS
1.0000 | ORAL_TABLET | ORAL | Status: DC | PRN
  Administered 2019-05-23: 1 via ORAL
  Filled 2019-05-22: qty 1

## 2019-05-22 MED ORDER — HYDROMORPHONE HCL 1 MG/ML IJ SOLN: 1.0000 mg | INTRAMUSCULAR | Status: DC | PRN

## 2019-05-22 MED ORDER — ALBUTEROL SULFATE (2.5 MG/3ML) 0.083% IN NEBU: 2.5000 mg | INHALATION_SOLUTION | RESPIRATORY_TRACT | Status: DC | PRN
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
## 2019-05-22 NOTE — ED Notes (Signed)
IV access attempted x2, unsuccessful.

## 2019-05-22 NOTE — ED Triage Notes (Addendum)
Pt arrives to ED from dialysis w/ c/o possible leg infection. Dialysis RN sent him here for eval of leg on wound. Pt MWF dialysis and did complete treatment today. Hx DM2.  Pt developed wound about a week ago but does not know how it started and it has gotten progressively worse. Reports pain started today 7/10 pain.

## 2019-05-23 DIAGNOSIS — J449 Chronic obstructive pulmonary disease, unspecified: Secondary | ICD-10-CM | POA: Diagnosis present

## 2019-05-23 DIAGNOSIS — L03116 Cellulitis of left lower limb: Secondary | ICD-10-CM | POA: Diagnosis present

## 2019-05-23 DIAGNOSIS — Z992 Dependence on renal dialysis: Secondary | ICD-10-CM | POA: Diagnosis not present

## 2019-05-23 DIAGNOSIS — M109 Gout, unspecified: Secondary | ICD-10-CM | POA: Diagnosis present

## 2019-05-23 DIAGNOSIS — Z87891 Personal history of nicotine dependence: Secondary | ICD-10-CM | POA: Diagnosis not present

## 2019-05-23 DIAGNOSIS — Z6836 Body mass index (BMI) 36.0-36.9, adult: Secondary | ICD-10-CM | POA: Diagnosis not present

## 2019-05-23 DIAGNOSIS — Z91018 Allergy to other foods: Secondary | ICD-10-CM | POA: Diagnosis not present

## 2019-05-23 DIAGNOSIS — Z833 Family history of diabetes mellitus: Secondary | ICD-10-CM | POA: Diagnosis not present

## 2019-05-23 DIAGNOSIS — Z20822 Contact with and (suspected) exposure to covid-19: Secondary | ICD-10-CM | POA: Diagnosis present

## 2019-05-23 DIAGNOSIS — E1169 Type 2 diabetes mellitus with other specified complication: Secondary | ICD-10-CM | POA: Diagnosis present

## 2019-05-23 DIAGNOSIS — G4733 Obstructive sleep apnea (adult) (pediatric): Secondary | ICD-10-CM | POA: Diagnosis present

## 2019-05-23 DIAGNOSIS — M1990 Unspecified osteoarthritis, unspecified site: Secondary | ICD-10-CM | POA: Diagnosis present

## 2019-05-23 DIAGNOSIS — N2581 Secondary hyperparathyroidism of renal origin: Secondary | ICD-10-CM | POA: Diagnosis present

## 2019-05-23 DIAGNOSIS — H9190 Unspecified hearing loss, unspecified ear: Secondary | ICD-10-CM | POA: Diagnosis present

## 2019-05-23 DIAGNOSIS — Z8249 Family history of ischemic heart disease and other diseases of the circulatory system: Secondary | ICD-10-CM | POA: Diagnosis not present

## 2019-05-23 DIAGNOSIS — Z7901 Long term (current) use of anticoagulants: Secondary | ICD-10-CM | POA: Diagnosis not present

## 2019-05-23 DIAGNOSIS — I120 Hypertensive chronic kidney disease with stage 5 chronic kidney disease or end stage renal disease: Secondary | ICD-10-CM | POA: Diagnosis present

## 2019-05-23 DIAGNOSIS — K219 Gastro-esophageal reflux disease without esophagitis: Secondary | ICD-10-CM | POA: Diagnosis present

## 2019-05-23 DIAGNOSIS — N186 End stage renal disease: Secondary | ICD-10-CM | POA: Diagnosis present

## 2019-05-23 DIAGNOSIS — L0390 Cellulitis, unspecified: Secondary | ICD-10-CM | POA: Diagnosis present

## 2019-05-23 DIAGNOSIS — E1122 Type 2 diabetes mellitus with diabetic chronic kidney disease: Secondary | ICD-10-CM | POA: Diagnosis present

## 2019-05-23 DIAGNOSIS — E872 Acidosis: Secondary | ICD-10-CM | POA: Diagnosis present

## 2019-05-23 DIAGNOSIS — I480 Paroxysmal atrial fibrillation: Secondary | ICD-10-CM | POA: Diagnosis present

## 2019-05-23 DIAGNOSIS — Z794 Long term (current) use of insulin: Secondary | ICD-10-CM | POA: Diagnosis not present

## 2019-05-23 LAB — COMPREHENSIVE METABOLIC PANEL
ALT: 22 U/L (ref 0–44)
AST: 23 U/L (ref 15–41)
Albumin: 2.7 g/dL — ABNORMAL LOW (ref 3.5–5.0)
Alkaline Phosphatase: 140 U/L — ABNORMAL HIGH (ref 38–126)
Anion gap: 13 (ref 5–15)
BUN: 34 mg/dL — ABNORMAL HIGH (ref 8–23)
CO2: 28 mmol/L (ref 22–32)
Calcium: 9.6 mg/dL (ref 8.9–10.3)
Chloride: 96 mmol/L — ABNORMAL LOW (ref 98–111)
Creatinine, Ser: 5.36 mg/dL — ABNORMAL HIGH (ref 0.61–1.24)
GFR calc Af Amer: 12 mL/min — ABNORMAL LOW (ref >60)
GFR calc non Af Amer: 11 mL/min — ABNORMAL LOW (ref >60)
Glucose, Bld: 123 mg/dL — ABNORMAL HIGH (ref 70–99)
Potassium: 3.4 mmol/L — ABNORMAL LOW (ref 3.5–5.1)
Sodium: 137 mmol/L (ref 135–145)
Total Bilirubin: 0.7 mg/dL (ref 0.3–1.2)
Total Protein: 7.2 g/dL (ref 6.5–8.1)

## 2019-05-23 LAB — PROTIME-INR
INR: 1.5 — ABNORMAL HIGH (ref 0.8–1.2)
INR: 1.5 — ABNORMAL HIGH (ref 0.8–1.2)
Prothrombin Time: 17.2 seconds — ABNORMAL HIGH (ref 11.4–15.2)
Prothrombin Time: 17.4 seconds — ABNORMAL HIGH (ref 11.4–15.2)

## 2019-05-23 LAB — CBC WITH DIFFERENTIAL/PLATELET
Abs Immature Granulocytes: 0.03 10*3/uL (ref 0.00–0.07)
Basophils Absolute: 0.1 10*3/uL (ref 0.0–0.1)
Basophils Relative: 1 %
Eosinophils Absolute: 0.2 10*3/uL (ref 0.0–0.5)
Eosinophils Relative: 3 %
HCT: 34.5 % — ABNORMAL LOW (ref 39.0–52.0)
Hemoglobin: 9.6 g/dL — ABNORMAL LOW (ref 13.0–17.0)
Immature Granulocytes: 1 %
Lymphocytes Relative: 21 %
Lymphs Abs: 1.4 10*3/uL (ref 0.7–4.0)
MCH: 21.4 pg — ABNORMAL LOW (ref 26.0–34.0)
MCHC: 27.8 g/dL — ABNORMAL LOW (ref 30.0–36.0)
MCV: 77 fL — ABNORMAL LOW (ref 80.0–100.0)
Monocytes Absolute: 1.2 10*3/uL — ABNORMAL HIGH (ref 0.1–1.0)
Monocytes Relative: 18 %
Neutro Abs: 3.6 10*3/uL (ref 1.7–7.7)
Neutrophils Relative %: 56 %
Platelets: 311 10*3/uL (ref 150–400)
RBC: 4.48 MIL/uL (ref 4.22–5.81)
RDW: 22.9 % — ABNORMAL HIGH (ref 11.5–15.5)
WBC: 6.5 10*3/uL (ref 4.0–10.5)
nRBC: 0 % (ref 0.0–0.2)

## 2019-05-23 LAB — SARS CORONAVIRUS 2 BY RT PCR (HOSPITAL ORDER, PERFORMED IN ~~LOC~~ HOSPITAL LAB): SARS Coronavirus 2: NEGATIVE

## 2019-05-23 LAB — LACTIC ACID, PLASMA: Lactic Acid, Venous: 2.1 mmol/L (ref 0.5–1.9)

## 2019-05-23 LAB — GLUCOSE, CAPILLARY
Glucose-Capillary: 139 mg/dL — ABNORMAL HIGH (ref 70–99)
Glucose-Capillary: 114 mg/dL — ABNORMAL HIGH (ref 70–99)
Glucose-Capillary: 160 mg/dL — ABNORMAL HIGH (ref 70–99)

## 2019-05-23 LAB — C-REACTIVE PROTEIN: CRP: 4.4 mg/dL — ABNORMAL HIGH (ref <1.0)

## 2019-05-23 LAB — MRSA PCR SCREENING: MRSA by PCR: NEGATIVE

## 2019-05-23 LAB — HEMOGLOBIN A1C
Hgb A1c MFr Bld: 6.9 % — ABNORMAL HIGH (ref 4.8–5.6)
Mean Plasma Glucose: 151.33 mg/dL

## 2019-05-23 LAB — CBG MONITORING, ED: Glucose-Capillary: 128 mg/dL — ABNORMAL HIGH (ref 70–99)

## 2019-05-23 LAB — MAGNESIUM: Magnesium: 2 mg/dL (ref 1.7–2.4)

## 2019-05-23 MED ORDER — ONDANSETRON HCL 4 MG/2ML IJ SOLN: 4.0000 mg | Freq: Four times a day (QID) | INTRAMUSCULAR | Status: DC | PRN

## 2019-05-23 MED ORDER — WARFARIN - PHARMACIST DOSING INPATIENT: Freq: Every day | Status: DC

## 2019-05-23 MED ORDER — FUROSEMIDE 80 MG PO TABS: 160.0000 mg | ORAL_TABLET | ORAL | Status: DC

## 2019-05-23 MED ORDER — INSULIN GLARGINE 100 UNIT/ML ~~LOC~~ SOLN
16.0000 [IU] | Freq: Every day | SUBCUTANEOUS | Status: DC
  Administered 2019-05-23: 16 [IU] via SUBCUTANEOUS
  Filled 2019-05-23 (×2): qty 0.16

## 2019-05-23 MED ORDER — METOPROLOL TARTRATE 100 MG PO TABS: 100.0000 mg | ORAL_TABLET | ORAL | Status: DC

## 2019-05-23 MED ORDER — INSULIN DEGLUDEC 100 UNIT/ML ~~LOC~~ SOPN: 16.0000 [IU] | PEN_INJECTOR | Freq: Every day | SUBCUTANEOUS | Status: DC

## 2019-05-23 MED ORDER — SODIUM CHLORIDE 0.9 % IV SOLN
2.0000 g | INTRAVENOUS | Status: DC
  Administered 2019-05-23 – 2019-05-24 (×2): 2 g via INTRAVENOUS
  Filled 2019-05-23 (×2): qty 20

## 2019-05-23 MED ORDER — METOPROLOL TARTRATE 100 MG PO TABS
100.0000 mg | ORAL_TABLET | ORAL | Status: DC
  Administered 2019-05-23 – 2019-05-24 (×2): 100 mg via ORAL
  Filled 2019-05-23 (×2): qty 1

## 2019-05-23 MED ORDER — POTASSIUM CHLORIDE CRYS ER 20 MEQ PO TBCR
40.0000 meq | EXTENDED_RELEASE_TABLET | Freq: Once | ORAL | Status: AC
  Administered 2019-05-23: 40 meq via ORAL
  Filled 2019-05-23: qty 2

## 2019-05-23 MED ORDER — ACETAMINOPHEN 650 MG RE SUPP: 650.0000 mg | Freq: Four times a day (QID) | RECTAL | Status: DC | PRN

## 2019-05-23 MED ORDER — ACETAMINOPHEN 325 MG PO TABS: 650.0000 mg | ORAL_TABLET | Freq: Four times a day (QID) | ORAL | Status: DC | PRN

## 2019-05-23 MED ORDER — WARFARIN SODIUM 7.5 MG PO TABS
7.5000 mg | ORAL_TABLET | Freq: Once | ORAL | Status: AC
  Administered 2019-05-23: 7.5 mg via ORAL
  Filled 2019-05-23: qty 1

## 2019-05-23 MED ORDER — FEBUXOSTAT 40 MG PO TABS
40.0000 mg | ORAL_TABLET | Freq: Every day | ORAL | Status: DC
  Administered 2019-05-23: 40 mg via ORAL
  Filled 2019-05-23 (×2): qty 1

## 2019-05-23 MED ORDER — METOPROLOL TARTRATE 50 MG PO TABS
50.0000 mg | ORAL_TABLET | Freq: Two times a day (BID) | ORAL | Status: DC
  Administered 2019-05-23: 50 mg via ORAL

## 2019-05-23 MED ORDER — PANTOPRAZOLE SODIUM 40 MG PO TBEC
40.0000 mg | DELAYED_RELEASE_TABLET | Freq: Every day | ORAL | Status: DC
  Administered 2019-05-23 – 2019-05-24 (×2): 40 mg via ORAL
  Filled 2019-05-23 (×2): qty 1

## 2019-05-23 MED ORDER — ONDANSETRON HCL 4 MG PO TABS: 4.0000 mg | ORAL_TABLET | Freq: Four times a day (QID) | ORAL | Status: DC | PRN

## 2019-05-23 MED ORDER — LANTHANUM CARBONATE 500 MG PO CHEW
2000.0000 mg | CHEWABLE_TABLET | Freq: Three times a day (TID) | ORAL | Status: DC
  Administered 2019-05-23 – 2019-05-24 (×3): 2000 mg via ORAL
  Filled 2019-05-23 (×4): qty 4

## 2019-05-23 MED ORDER — FUROSEMIDE 80 MG PO TABS
160.0000 mg | ORAL_TABLET | ORAL | Status: DC
  Administered 2019-05-23 – 2019-05-24 (×2): 160 mg via ORAL
  Filled 2019-05-23 (×2): qty 2

## 2019-05-23 MED ORDER — OMEGA-3-ACID ETHYL ESTERS 1 G PO CAPS
1000.0000 mg | ORAL_CAPSULE | Freq: Every day | ORAL | Status: DC
  Administered 2019-05-23 – 2019-05-24 (×2): 1000 mg via ORAL
  Filled 2019-05-23 (×3): qty 1

## 2019-05-23 MED ORDER — DOCUSATE SODIUM 100 MG PO CAPS
100.0000 mg | ORAL_CAPSULE | Freq: Every day | ORAL | Status: DC
  Administered 2019-05-23 (×2): 100 mg via ORAL
  Filled 2019-05-23 (×2): qty 1

## 2019-05-23 MED ORDER — METOPROLOL TARTRATE 25 MG PO TABS
100.0000 mg | ORAL_TABLET | Freq: Two times a day (BID) | ORAL | Status: DC
  Filled 2019-05-23: qty 4

## 2019-05-23 MED ORDER — WARFARIN SODIUM 2.5 MG PO TABS: 2.5000 mg | ORAL_TABLET | ORAL | Status: DC

## 2019-05-23 MED ORDER — POLYETHYLENE GLYCOL 3350 17 G PO PACK: 17.0000 g | PACK | Freq: Every day | ORAL | Status: DC | PRN

## 2019-05-23 MED ORDER — EZETIMIBE 10 MG PO TABS
10.0000 mg | ORAL_TABLET | Freq: Every day | ORAL | Status: DC
  Administered 2019-05-23: 10 mg via ORAL
  Filled 2019-05-23: qty 1

## 2019-05-23 MED ORDER — COLCHICINE 0.6 MG PO TABS: 0.6000 mg | ORAL_TABLET | Freq: Every day | ORAL | Status: DC | PRN

## 2019-05-23 MED ORDER — VERAPAMIL HCL ER 240 MG PO TBCR
240.0000 mg | EXTENDED_RELEASE_TABLET | Freq: Every day | ORAL | Status: DC
  Administered 2019-05-23: 240 mg via ORAL
  Filled 2019-05-23 (×3): qty 1

## 2019-05-23 MED ORDER — CHLORHEXIDINE GLUCONATE CLOTH 2 % EX PADS
6.0000 | MEDICATED_PAD | Freq: Every day | CUTANEOUS | Status: DC
  Administered 2019-05-23: 6 via TOPICAL

## 2019-05-23 MED ORDER — CALCIUM ACETATE (PHOS BINDER) 667 MG PO CAPS
667.0000 mg | ORAL_CAPSULE | Freq: Three times a day (TID) | ORAL | Status: DC
  Administered 2019-05-23 – 2019-05-24 (×3): 667 mg via ORAL
  Filled 2019-05-23 (×4): qty 1

## 2019-05-23 MED ORDER — WARFARIN SODIUM 6 MG PO TABS
6.0000 mg | ORAL_TABLET | ORAL | Status: AC
  Administered 2019-05-23: 6 mg via ORAL
  Filled 2019-05-23: qty 1

## 2019-05-23 MED ORDER — ATORVASTATIN CALCIUM 40 MG PO TABS
40.0000 mg | ORAL_TABLET | Freq: Every day | ORAL | Status: DC
  Filled 2019-05-23: qty 1

## 2019-05-23 MED ORDER — GABAPENTIN 100 MG PO CAPS
100.0000 mg | ORAL_CAPSULE | Freq: Every day | ORAL | Status: DC
  Administered 2019-05-23 (×2): 100 mg via ORAL
  Filled 2019-05-23 (×2): qty 1

## 2019-05-23 NOTE — Progress Notes (Signed)
New Admission Note:   Arrival Method: Stretcher  Mental Orientation: Alert and oriented  Telemetry: Box 16  Assessment: Completed Skin: cellulitis to left leg  RA:QTMA forearm  Pain: 0/10  Tubes: none  Safety Measures: Safety Fall Prevention Plan has been given, discussed and signed Admission: Completed [facility] [place] Orientation: Patient has been orientated to the room, unit and staff.  Family: wife   Orders have been reviewed and implemented. Will continue to monitor the patient. Call light has been placed within reach and bed alarm has been activated.   Saori Umholtz RN [facility] Phone: [phone]

## 2019-05-23 NOTE — Progress Notes (Signed)
PROGRESS NOTE    ISSAC MOURE  KGM:[id number] DOB: 03/05/1955 DOA: 05/22/2019 PCP: Crist Infante, MD    Brief Narrative:  ARLON BLEIER is a 64 year old Caucasian male with past medical history remarkable for ESRD on HD MWF, COPD, type 2 diabetes mellitus, HTN, gout, OSA, obesity, paroxysmal atrial fibrillation on Coumadin who presented to [facility] ED with left lower extremity pain, swelling, erythema.  Patient states symptoms have been progressing over the past few weeks, after injuring his anterior lower shin with associated wounds; unclear mechanism of injury as he does not recall exactly.  Now wounds on his anterior shin more painful with surrounding redness.  In the ED, patient was noted to have left lower extremity cellulitis.  He was afebrile without leukocytosis.  He was given 1 dose of ceftazidime and 2 doses of Percocet for substantial pain.  Due to rapid progression of his symptoms and substantial pain, TRH was consulted for further evaluation and treatment.   Assessment & Plan:   Principal Problem:   Cellulitis of left lower extremity Active Problems:   Class 2 severe obesity due to excess calories with serious comorbidity and body mass index (BMI) of 36.0 to 36.9 in adult (HCC)   PAF (paroxysmal atrial fibrillation) (HCC)   Type 2 diabetes mellitus with ESRD (end-stage renal disease) ([place])   Essential hypertension   Gout   OSA on CPAP   ESRD (end stage renal disease) on dialysis ([place])   Mixed diabetic hyperlipidemia associated with type 2 diabetes mellitus (HCC)   GERD without esophagitis   Open wound of lower leg with complication, left, initial encounter   Left lower extremity cellulitis Lactic acidosis Patient presenting with progressive left lower extremity erythema, pain with associated wounds.  Unclear mechanism of injury as patient with poor recall.  Patient was afebrile without leukocytosis.  Lactic acid elevated 2.8.  CRP 4.4.  X-ray left tibia/fibula  with no acute bony abnormality.  Received dose of ceftazidime in the ED. --Blood cultures x2: Pending --Continue ceftriaxone 2 g IV every 24 hours --Wound care evaluation  ESRD on HD MWF Patient reports completed full dialysis session on Friday. --Continue outpatient HD follow-up  Paroxysmal atrial fibrillation Subtherapeutic INR INR 1.5. --Pharmacy consulted for assistance with dosing/monitoring Coumadin --Continue metoprolol tartrate 100 mg p.o. twice daily  COPD --Albuterol neb as needed  Gout: Continue Uloric 40 mg p.o. daily  Type 2 diabetes mellitus Hemoglobin A1c 6.9, well controlled. --Lantus 16 units [place] daily  Essential hypertension BP 135/76, fairly well controlled. --Continue metoprolol tartrate 100 mg p.o. twice daily --Verapamil 240 mg p.o. daily --Lasix 160 mg BID  GERD: Continue PPI  OSA: Continue nocturnal CPAP  HLD: Continue atorvastatin 40 mg p.o. daily  Obesity Body mass index is 36.68 kg/m.  Counseled on need for weight loss/lifestyle changes as this complicates all facets of care.    DVT prophylaxis: Coumadin Code Status: Full code Family Communication: None present at bedside  Disposition Plan:  Status is: Observation  The patient remains OBS appropriate and will d/c before 2 midnights.  Dispo: The patient is from: Home              Anticipated d/c is to: Home              Anticipated d/c date is: 1 day              Patient currently is not medically stable to d/c.   Consultants:   None  Procedures:  None  Antimicrobials:   Ceftazidime 5/14 -5/14  Ceftriaxone 5/15>>   Subjective: Patient seen and examined at the bedside, resting comfortably.  Continues with pain to the left lower extremity.  No other specific complaints or concerns at this time.  Denies headache, no fever/chills/night sweats, no nausea/vomiting/diarrhea, no chest pain, no palpitations, no shortness of breath, no abdominal pain.  No acute events overnight  per nursing staff.  Objective: Vitals:   05/23/19 0748 05/23/19 0845 05/23/19 0912 05/23/19 1301  BP: 136/86  126/84 119/77  Pulse: 84  80   Resp: 18  16   Temp: 98.5 F (36.9 C) 97.9 F (36.6 C) 98.1 F (36.7 C)   TempSrc: Oral  Oral   SpO2: 94%  91% 93%  Weight:      Height:       No intake or output data in the 24 hours ending 05/23/19 1308 Filed Weights   05/22/19 1139  Weight: 126.1 kg    Examination:  General exam: Appears calm and comfortable  Respiratory system: Clear to auscultation. Respiratory effort normal.  Oxygenating well on room air Cardiovascular system: S1 & S2 heard, RRR. No JVD, murmurs, rubs, gallops or clicks.  2+ pitting edema bilateral lower extremities up to knee Gastrointestinal system: Abdomen is nondistended, soft and nontender. No organomegaly or masses felt. Normal bowel sounds heard. Central nervous system: Alert and oriented. No focal neurological deficits. Extremities: Symmetric 5 x 5 power. Skin: No rashes, lesions or ulcers Psychiatry: Judgement and insight appear normal. Mood & affect appropriate.     Data Reviewed: I have personally reviewed following labs and imaging studies  CBC: Recent Labs  Lab 05/22/19 1150 05/23/19 0500  WBC 6.9 6.5  NEUTROABS 4.7 3.6  HGB 9.7* 9.6*  HCT 34.8* 34.5*  MCV 77.5* 77.0*  PLT 335 570   Basic Metabolic Panel: Recent Labs  Lab 05/22/19 1150 05/23/19 0500  NA 136 137  K 3.1* 3.4*  CL 93* 96*  CO2 27 28  GLUCOSE 158* 123*  BUN 21 34*  CREATININE 3.73* 5.36*  CALCIUM 9.3 9.6  MG  --  2.0   GFR: Estimated Creatinine Clearance: 19.9 mL/min (A) (by C-G formula based on SCr of 5.36 mg/dL (H)). Liver Function Tests: Recent Labs  Lab 05/22/19 1150 05/23/19 0500  AST 26 23  ALT 25 22  ALKPHOS 154* 140*  BILITOT 0.8 0.7  PROT 7.8 7.2  ALBUMIN 2.8* 2.7*   No results for input(s): LIPASE, AMYLASE in the last 168 hours. No results for input(s): AMMONIA in the last 168 hours.  Coagulation Profile: Recent Labs  Lab 05/22/19 2348 05/23/19 0500  INR 1.5* 1.5*   Cardiac Enzymes: No results for input(s): CKTOTAL, CKMB, CKMBINDEX, TROPONINI in the last 168 hours. BNP (last 3 results) No results for input(s): PROBNP in the last 8760 hours. HbA1C: Recent Labs    05/22/19 2348  HGBA1C 6.9*   CBG: Recent Labs  Lab 05/22/19 1903 05/23/19 0828 05/23/19 1112  GLUCAP 114* 128* 139*   Lipid Profile: No results for input(s): CHOL, HDL, LDLCALC, TRIG, CHOLHDL, LDLDIRECT in the last 72 hours. Thyroid Function Tests: No results for input(s): TSH, T4TOTAL, FREET4, T3FREE, THYROIDAB in the last 72 hours. Anemia Panel: No results for input(s): VITAMINB12, FOLATE, FERRITIN, TIBC, IRON, RETICCTPCT in the last 72 hours. Sepsis Labs: Recent Labs  Lab 05/22/19 1200 05/22/19 2348  LATICACIDVEN 2.8* 2.1*    Recent Results (from the past 240 hour(s))  SARS Coronavirus 2 by RT PCR (  hospital order, performed in [facility] hospital lab) Nasopharyngeal Nasopharyngeal Swab     Status: None   Collection Time: 05/22/19 11:13 PM   Specimen: Nasopharyngeal Swab  Result Value Ref Range Status   SARS Coronavirus 2 NEGATIVE NEGATIVE Final    Comment: (NOTE) SARS-CoV-2 target nucleic acids are NOT DETECTED. The SARS-CoV-2 RNA is generally detectable in upper and lower respiratory specimens during the acute phase of infection. The lowest concentration of SARS-CoV-2 viral copies this assay can detect is 250 copies / mL. A negative result does not preclude SARS-CoV-2 infection and should not be used as the sole basis for treatment or other patient management decisions.  A negative result may occur with improper specimen collection / handling, submission of specimen other than nasopharyngeal swab, presence of viral mutation(s) within the areas targeted by this assay, and inadequate number of viral copies (<250 copies / mL). A negative result must be combined with clinical  observations, patient history, and epidemiological information. Fact Sheet for Patients:   StrictlyIdeas.no Fact Sheet for Healthcare Providers: BankingDealers.co.za This test is not yet approved or cleared  by the [place] FDA and has been authorized for detection and/or diagnosis of SARS-CoV-2 by FDA under an Emergency Use Authorization (EUA).  This EUA will remain in effect (meaning this test can be used) for the duration of the COVID-19 declaration under Section 564(b)(1) of the Act, 21 U.S.C. section 360bbb-3(b)(1), unless the authorization is terminated or revoked sooner. Performed at [facility], [place] [address], [place], [place] [address]   MRSA PCR Screening     Status: None   Collection Time: 05/23/19  4:56 AM   Specimen: Nasopharyngeal  Result Value Ref Range Status   MRSA by PCR NEGATIVE NEGATIVE Final    Comment:        The GeneXpert MRSA Assay (FDA approved for NASAL specimens only), is one component of a comprehensive MRSA colonization surveillance program. It is not intended to diagnose MRSA infection nor to guide or monitor treatment for MRSA infections. Performed at [facility], [place] [address], [place],  [address]          Radiology Studies: DG Tibia/Fibula Left  Result Date: 05/22/2019 CLINICAL DATA:  Leg wound EXAM: LEFT TIBIA AND FIBULA - 2 VIEW COMPARISON:  None. FINDINGS: No acute bony abnormality. Specifically, no fracture, subluxation, or dislocation. No radiographic changes of osteomyelitis. Diffuse vascular calcifications. Soft tissues are intact. IMPRESSION: No acute bony abnormality. Electronically Signed   By: Rolm Baptise M.D.   On: 05/22/2019 19:42        Scheduled Meds: . atorvastatin  40 mg Oral Q lunch  . calcium acetate  667 mg Oral TID WC  . Chlorhexidine Gluconate Cloth  6 each Topical Daily  . docusate sodium  100 mg Oral QHS  . ezetimibe  10 mg Oral Q  lunch  . febuxostat  40 mg Oral Q lunch  . [START ON 05/25/2019] furosemide  160 mg Oral 2 times per day on Mon Wed Fri  . furosemide  160 mg Oral 2 times per day on Sun Tue Thu Sat  . gabapentin  100 mg Oral QHS  . insulin aspart  0-6 Units Subcutaneous TID AC & HS  . insulin glargine  16 Units Subcutaneous Q lunch  . lanthanum  2,000 mg Oral TID WC  . [START ON 05/25/2019] metoprolol tartrate  100 mg Oral 2 times per day on Mon Wed Fri  . metoprolol tartrate  100 mg Oral 2 times per day on Sun Tue Thu Sat  . omega-3 acid ethyl esters  1,000 mg Oral Daily  . pantoprazole  40 mg Oral Daily  . verapamil  240 mg Oral Q1200  . warfarin  7.5 mg Oral ONCE-1600  . Warfarin - Pharmacist Dosing Inpatient   Does not apply q1600   Continuous Infusions: . cefTRIAXone (ROCEPHIN)  IV Stopped (05/23/19 0504)     LOS: 0 days    Time spent: 38 minutes spent on chart review, discussion with nursing staff, consultants, updating family and interview/physical exam; more than 50% of that time was spent in counseling and/or coordination of care.    Eric J [place] DO [facility] Available via Epic secure chat 7am-7pm After these hours, please refer to coverage provider listed on [email or web address] 05/23/2019, 1:08 PM

## 2019-05-23 NOTE — Progress Notes (Signed)
Pt placed on cpap of 10 and a nasal mask. tol well at this time

## 2019-05-23 NOTE — Evaluation (Signed)
Physical Therapy Evaluation Patient Details Name: Kevin James MRN: [id number] DOB: [birth year] Today's Date: 05/23/2019   History of Present Illness  64 y/o male admitted with pain and weakness on LLE was diagnosed with cellulitis, referred to PT for mobility.  PMHx:  ESRD on HD, DM, COPD, OSA, PAF, HTN, obesity, GERD,   Clinical Impression  Pt was seen for mobility from bed level to assess tolerance for gait as well as safety.  Pt is able to walk well with quad cane but is dropping O2 sats to 84-87% level with mobility.  Has 91% baseline, has apparently not been on O2 at home previously.  Follow up with therapy to assess sats with gait, and will recommend he not walk without checking the values. Pt is SOB with second trip on cane, but also requires very upright posture afterward.  Follow for these needs.    Follow Up Recommendations Home health PT;Supervision for mobility/OOB    Equipment Recommendations  None recommended by PT    Recommendations for Other Services       Precautions / Restrictions Precautions Precautions: Fall Precaution Comments: monitor O2 sats Restrictions Weight Bearing Restrictions: No      Mobility  Bed Mobility Overal bed mobility: Needs Assistance Bed Mobility: Supine to Sit;Sit to Supine     Supine to sit: Min assist Sit to supine: Min assist   General bed mobility comments: min assist to support trunk to scoot out with HOB raised, min to lift legs to bed to return  Transfers Overall transfer level: Needs assistance Equipment used: 1 person hand held assist;Quad cane Transfers: Sit to/from Stand Sit to Stand: Min assist         General transfer comment: min assist with pt on side of bed, bed fairly low  Ambulation/Gait Ambulation/Gait assistance: Min guard Gait Distance (Feet): 250 Feet(125 x 2) Assistive device: 1 person hand held assist;Quad cane Gait Pattern/deviations: Wide base of support;Decreased stride length(lateral shifting  on quad cane) Gait velocity: reduced   General Gait Details: pt is touching quad cane down, not fully wb through it  Stairs            Wheelchair Mobility    Modified Rankin (Stroke Patients Only)       Balance Overall balance assessment: Needs assistance Sitting-balance support: Feet supported Sitting balance-Leahy Scale: Good Sitting balance - Comments: sitting side of bed with wide placeement of knees   Standing balance support: Single extremity supported;Bilateral upper extremity supported Standing balance-Leahy Scale: Fair Standing balance comment: less than fair balance with min guard from PT and quad cane                              Pertinent Vitals/Pain Pain Assessment: Faces Faces Pain Scale: Hurts little more Pain Location: LLE with movement Pain Descriptors / Indicators: Guarding Pain Intervention(s): Monitored during session;Limited activity within patient's tolerance;Repositioned    Home Living Family/patient expects to be discharged to:: Private residence Living Arrangements: Spouse/significant other Available Help at Discharge: Family;Available 24 hours/day Type of Home: House Home Access: Level entry Entrance Stairs-Rails: Right;Left   Home Layout: One level Home Equipment: Walker - 2 wheels;Cane - single point Additional Comments: Pt is noting he still drives and uses SPC exclusively at home.     Prior Function Level of Independence: Independent with assistive device(s)         Comments: SPC on all surfaces     Hand Dominance  Dominant Hand: Right    Extremity/Trunk Assessment   Upper Extremity Assessment Upper Extremity Assessment: Overall WFL for tasks assessed    Lower Extremity Assessment Lower Extremity Assessment: Generalized weakness    Cervical / Trunk Assessment Cervical / Trunk Assessment: Normal  Communication   Communication: No difficulties  Cognition Arousal/Alertness: Awake/alert Behavior During  Therapy: WFL for tasks assessed/performed Overall Cognitive Status: Within Functional Limits for tasks assessed                                 General Comments: fully cooperative and motivated to wrk      General Comments General comments (skin integrity, edema, etc.): pt is demonstrating control of balance but noted a drop in O2 sats with gait from baseline of 91%.  Has 87% on R hand initially then 84% on L hand, down to 85% again on R hand, shared with nursing    Exercises     Assessment/Plan    PT Assessment Patient needs continued PT services  PT Problem List Decreased strength;Decreased range of motion;Decreased activity tolerance;Decreased balance;Decreased mobility;Decreased coordination;Cardiopulmonary status limiting activity;Obesity;Pain;Decreased skin integrity       PT Treatment Interventions DME instruction;Gait training;Functional mobility training;Therapeutic activities;Therapeutic exercise;Balance training;Neuromuscular re-education;Patient/family education    PT Goals (Current goals can be found in the Care Plan section)  Acute Rehab PT Goals Patient Stated Goal: to get home with wife PT Goal Formulation: With patient/family Time For Goal Achievement: 06/06/19 Potential to Achieve Goals: Good    Frequency Min 3X/week   Barriers to discharge Decreased caregiver support hoem with wife only    Co-evaluation               AM-PAC PT "6 Clicks" Mobility  Outcome Measure Help needed turning from your back to your side while in a flat bed without using bedrails?: A Little Help needed moving from lying on your back to sitting on the side of a flat bed without using bedrails?: A Little Help needed moving to and from a bed to a chair (including a wheelchair)?: A Little Help needed standing up from a chair using your arms (e.g., wheelchair or bedside chair)?: A Little Help needed to walk in hospital room?: A Little Help needed climbing 3-5 steps  with a railing? : A Lot 6 Click Score: 17    End of Session Equipment Utilized During Treatment: Gait belt Activity Tolerance: Patient limited by fatigue;Treatment limited secondary to medical complications (Comment) Patient left: in bed;with call bell/phone within reach;with bed alarm set;with family/visitor present Nurse Communication: Mobility status PT Visit Diagnosis: Unsteadiness on feet (R26.81);Muscle weakness (generalized) (M62.81);Adult, failure to thrive (R62.7);Pain Pain - Right/Left: Left Pain - part of body: Leg    Time: 1610-9604 PT Time Calculation (min) (ACUTE ONLY): 19 min   Charges:   PT Evaluation $PT Eval Moderate Complexity: 1 Mod         Ramond Dial 05/23/2019, 1:47 PM  Mee Hives, PT MS Acute Rehab Dept. Number: [place]
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
## 2019-05-23 NOTE — Consult Note (Signed)
[place] Nurse Consult Note: Reason for Consult: Left anterior (pretibial) wounds (2), proximal > distal Wound type: trauma, vs arterial insufficiency Pressure Injury POA: N/A Measurement: Proximal measures 2.5cm x 2cm with dried serum vs eschar obscuring wound bed Distal measures 1.2cm x 1cm with dried serum vs eschar obscuring wound bed Wound bed:As described above Drainage (amount, consistency, odor) None Periwound:with erythema, mild edema Dressing procedure/placement/frequency: ED MD provided wound care orders using a non formulary antimicrobial dressing (Medihoney). I communicated with Dr. [place] and offered to provide Nursing with guidance for a formulary product in the same category (antimicrobial) and he is in agreement.  Orders provided for Nursing using house antimicrobial product, xeroform twice daily after soap and water cleanse of the LE.  [facility] nursing team will not follow, but will remain available to this patient, the nursing and medical teams.  Please re-consult if needed. Thanks, Maudie Flakes, MSN, RN, [place], Arther Abbott  Pager# [phone]

## 2019-05-23 NOTE — ED Notes (Signed)
Breakfast Ordered 

## 2019-05-23 NOTE — Plan of Care (Signed)
  Problem: Activity: Goal: Risk for activity intolerance will decrease Outcome: Progressing   
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
## 2019-05-23 NOTE — ED Notes (Signed)
CBG 128 

## 2019-05-24 LAB — CBC
HCT: 33 % — ABNORMAL LOW (ref 39.0–52.0)
Hemoglobin: 9.2 g/dL — ABNORMAL LOW (ref 13.0–17.0)
MCH: 21.3 pg — ABNORMAL LOW (ref 26.0–34.0)
MCHC: 27.9 g/dL — ABNORMAL LOW (ref 30.0–36.0)
MCV: 76.6 fL — ABNORMAL LOW (ref 80.0–100.0)
Platelets: 335 10*3/uL (ref 150–400)
RBC: 4.31 MIL/uL (ref 4.22–5.81)
RDW: 23.1 % — ABNORMAL HIGH (ref 11.5–15.5)
WBC: 7.2 10*3/uL (ref 4.0–10.5)
nRBC: 0 % (ref 0.0–0.2)

## 2019-05-24 LAB — PROTIME-INR
INR: 1.7 — ABNORMAL HIGH (ref 0.8–1.2)
Prothrombin Time: 19.6 seconds — ABNORMAL HIGH (ref 11.4–15.2)

## 2019-05-24 LAB — GLUCOSE, CAPILLARY
Glucose-Capillary: 126 mg/dL — ABNORMAL HIGH (ref 70–99)
Glucose-Capillary: 139 mg/dL — ABNORMAL HIGH (ref 70–99)

## 2019-05-24 LAB — BASIC METABOLIC PANEL
Anion gap: 17 — ABNORMAL HIGH (ref 5–15)
BUN: 47 mg/dL — ABNORMAL HIGH (ref 8–23)
CO2: 25 mmol/L (ref 22–32)
Calcium: 9.6 mg/dL (ref 8.9–10.3)
Chloride: 96 mmol/L — ABNORMAL LOW (ref 98–111)
Creatinine, Ser: 6.82 mg/dL — ABNORMAL HIGH (ref 0.61–1.24)
GFR calc Af Amer: 9 mL/min — ABNORMAL LOW (ref >60)
GFR calc non Af Amer: 8 mL/min — ABNORMAL LOW (ref >60)
Glucose, Bld: 132 mg/dL — ABNORMAL HIGH (ref 70–99)
Potassium: 3.9 mmol/L (ref 3.5–5.1)
Sodium: 138 mmol/L (ref 135–145)

## 2019-05-24 LAB — MAGNESIUM: Magnesium: 2.1 mg/dL (ref 1.7–2.4)

## 2019-05-24 MED ORDER — OXYCODONE-ACETAMINOPHEN 5-325 MG PO TABS: 1.0000 | ORAL_TABLET | Freq: Four times a day (QID) | ORAL | 0 refills | Status: AC | PRN

## 2019-05-24 MED ORDER — WARFARIN SODIUM 7.5 MG PO TABS: 7.5000 mg | ORAL_TABLET | Freq: Once | ORAL | Status: DC

## 2019-05-24 MED ORDER — CEPHALEXIN 500 MG PO CAPS: 500.0000 mg | ORAL_CAPSULE | Freq: Three times a day (TID) | ORAL | 0 refills | Status: AC

## 2019-05-24 NOTE — Evaluation (Signed)
Occupational Therapy Evaluation Patient Details Name: Kevin James MRN: [id number] DOB: 08/29/1955 Today's Date: 05/24/2019    History of Present Illness 64 y/o male admitted with pain and weakness on LLE was diagnosed with cellulitis, referred to PT for mobility.  PMHx:  ESRD on HD, DM, COPD, OSA, PAF, HTN, obesity, GERD,    Clinical Impression   PTA, pt was living at home with his wife, pt reports he was independent with ADL/IADL and modified independent with quad cane. Pt currently requires supervision to minguard for completion of ADL and functional mobility. Pt demonstrated decreased activity tolerance, educated pt on energy conservation strategies with provided handout. SpO2 91%-95% throughout session, dip in O2 with standing to complete grooming at sink level, educated pt on pursed lip breathing. Due to decline in current level of function, pt would benefit from acute OT to address established goals to facilitate safe D/C to venue listed below. At this time, recommend HHOT follow-up. Will continue to follow acutely.     Follow Up Recommendations  No OT follow up;Supervision - Intermittent    Equipment Recommendations  3 in 1 bedside commode    Recommendations for Other Services       Precautions / Restrictions Precautions Precautions: Fall Precaution Comments: monitor O2 sats Restrictions Weight Bearing Restrictions: No      Mobility Bed Mobility Overal bed mobility: Modified Independent             General bed mobility comments: pt sitting EOB upon arrival  Transfers Overall transfer level: Needs assistance Equipment used: Quad cane Transfers: Sit to/from Stand Sit to Stand: Min guard         General transfer comment: pt required increased time and effort, utilized rocking for forward propulsion for powerup into standing    Balance Overall balance assessment: Needs assistance Sitting-balance support: Feet supported Sitting balance-Leahy Scale:  Good Sitting balance - Comments: able to reach outside base of support, able to figure-4   Standing balance support: Single extremity supported;Bilateral upper extremity supported Standing balance-Leahy Scale: Fair Standing balance comment: minguard for mobility with use of quad cane, vc for proper use of cane                           ADL either performed or assessed with clinical judgement   ADL Overall ADL's : Needs assistance/impaired Eating/Feeding: Modified independent   Grooming: Supervision/safety;Standing Grooming Details (indicate cue type and reason): completed at sink level Upper Body Bathing: Set up   Lower Body Bathing: Supervison/ safety;Sit to/from stand Lower Body Bathing Details (indicate cue type and reason): pt required increased time and effort for powerup Upper Body Dressing : Modified independent   Lower Body Dressing: Sit to/from stand;Min guard Lower Body Dressing Details (indicate cue type and reason): able to figure-4, pt donned socks sitting EOB Toilet Transfer: Ambulation;Min guard(quad cane)   Toileting- Clothing Manipulation and Hygiene: Sit to/from stand;Min guard       Functional mobility during ADLs: Cane;Min guard General ADL Comments: minor instability noted, pt required cues to keep cane on ground for proper use     Vision         Perception     Praxis      Pertinent Vitals/Pain Pain Assessment: Faces Faces Pain Scale: Hurts little more Pain Location: LLE with movement Pain Descriptors / Indicators: Guarding Pain Intervention(s): Monitored during session     Hand Dominance Right   Extremity/Trunk Assessment Upper Extremity Assessment Upper  Extremity Assessment: Overall WFL for tasks assessed   Lower Extremity Assessment Lower Extremity Assessment: Generalized weakness   Cervical / Trunk Assessment Cervical / Trunk Assessment: Normal   Communication Communication Communication: No difficulties   Cognition  Arousal/Alertness: Awake/alert Behavior During Therapy: WFL for tasks assessed/performed Overall Cognitive Status: Within Functional Limits for tasks assessed                                 General Comments: fully cooperative and motivated to participate   General Comments  RA O2 at rest 95%, SpO2 with mobility 91%-94% with vc for pursed lip breathing    Exercises Exercises: Other exercises Other Exercises Other Exercises: educated pt on energy conservation strategies with provided handout.   Shoulder Instructions      Home Living Family/patient expects to be discharged to:: Private residence Living Arrangements: Spouse/significant other Available Help at Discharge: Family;Available 24 hours/day Type of Home: House Home Access: Level entry   Entrance Stairs-Rails: Right;Left Home Layout: One level         Bathroom Toilet: Standard Bathroom Accessibility: Yes How Accessible: Accessible via walker Home Equipment: [place] - 2 wheels;Cane - single point   Additional Comments: Pt is noting he still drives and uses SPC exclusively at home.       Prior Functioning/Environment Level of Independence: Independent with assistive device(s)        Comments: SPC on all surfaces        OT Problem List: Decreased activity tolerance;Decreased safety awareness;Decreased knowledge of use of DME or AE;Cardiopulmonary status limiting activity;Pain;Obesity      OT Treatment/Interventions: Self-care/ADL training;Energy conservation;DME and/or AE instruction;Therapeutic activities;Patient/family education;Balance training    OT Goals(Current goals can be found in the care plan section) Acute Rehab OT Goals Patient Stated Goal: to get home with wife OT Goal Formulation: With patient Time For Goal Achievement: 06/07/19 Potential to Achieve Goals: Good  OT Frequency: Min 2X/week   Barriers to D/C: Decreased caregiver support          Co-evaluation               AM-PAC OT "6 Clicks" Daily Activity     Outcome Measure Help from another person eating meals?: None Help from another person taking care of personal grooming?: A Little Help from another person toileting, which includes using toliet, bedpan, or urinal?: A Little Help from another person bathing (including washing, rinsing, drying)?: A Little Help from another person to put on and taking off regular upper body clothing?: None Help from another person to put on and taking off regular lower body clothing?: A Little 6 Click Score: 20   End of Session Equipment Utilized During Treatment: Gait belt(quad cane) Nurse Communication: Mobility status  Activity Tolerance: Patient tolerated treatment well Patient left: in bed;with call bell/phone within reach(with DO present)  OT Visit Diagnosis: Other abnormalities of gait and mobility (R26.89);Pain Pain - Right/Left: Left Pain - part of body: Leg                Time: 9030-0923 OT Time Calculation (min): 13 min Charges:  OT General Charges $OT Visit: 1 Visit OT Evaluation $OT Eval Moderate Complexity: [place] OTR/L Acute Rehabilitation Services Office: [place] 05/24/2019, 9:32 AM

## 2019-05-24 NOTE — Discharge Summary (Signed)
Physician Discharge Summary  Kevin James ALP:[id number] DOB: March 16, 1955 DOA: 05/22/2019  PCP: Crist Infante, MD  Admit date: 05/22/2019 Discharge date: 05/24/2019  Admitted From: Home Disposition: Home  Recommendations for Outpatient Follow-up:  1. Follow up with PCP in 1-2 weeks 2. Please follow INR closely as he was subtherapeutic during hospitalization 3. Recommend nephrology consider further volume reduction given his bilateral lower extremity edema during dialysis 4. Discharged on Keflex 500 mg p.o. 3 times daily to complete a 10-day course for left lower extremity cellulitis 5. Consider outpatient referral to wound care  Home Health: PT Equipment/Devices: 3 1 bedside commode  Discharge Condition: Stable CODE STATUS: Full code Diet recommendation: Heart healthy/consistent carbohydrate diet  History of present illness:  Kevin James is a 64 year old Caucasian male with past medical history remarkable for ESRD on HD MWF, COPD, type 2 diabetes mellitus, HTN, gout, OSA, obesity, paroxysmal atrial fibrillation on Coumadin who presented to [facility], ED with left lower extremity pain, swelling, erythema.  Patient states symptoms have been progressing over the past few weeks, after injuring his anterior lower shin with associated wounds; unclear mechanism of injury as he does not recall exactly.  Now wounds on his anterior shin more painful with surrounding redness.  In the ED, patient was noted to have left lower extremity cellulitis.  He was afebrile without leukocytosis.  He was given 1 dose of ceftazidime and 2 doses of Percocet for substantial pain.  Due to rapid progression of his symptoms and substantial pain, [facility] was consulted for further evaluation and treatment.  Hospital course:  Left lower extremity cellulitis Lactic acidosis Patient presenting with progressive left lower extremity erythema, pain with associated wounds.  Unclear mechanism of injury as patient with  poor recall.  Patient was afebrile without leukocytosis.  Lactic acid elevated 2.8.  CRP 4.4.  X-ray left tibia/fibula with no acute bony abnormality.  Received dose of ceftazidime in the ED. patient continued on ceftriaxone 2 g IV during his hospitalization and will transition to Keflex 500 mg p.o. 3 times daily to complete a 10-day course.  Continue wound care.  Recommend nephrology consider further volume reduction given his lower extremity edema as likely complicating factor.  Educated patient on need to elevate lower extremities while in the seated/supine position as much as possible.  Consider outpatient referral to wound care.  ESRD on HD MWF Continue outpatient follow-up with nephrology and home dialysis unit.  Recommend nephrology consider further volume reduction given his lower extremity edema as above.  Paroxysmal atrial fibrillation Subtherapeutic INR INR 1.5. Pharmacy consulted for assistance with dosing/monitoring Coumadin during his hospitalization.  INR 1.7 at time of discharge.  Continue Coumadin 5 mg p.o. daily except Fridays 2.5 mg.  Recommend repeat INR at PCP visit to ensure he is within the therapeutic range. Continue metoprolol tartrate 100 mg p.o. twice daily  COPD Albuterol neb as needed  Gout: Continue Uloric 40 mg p.o. daily  Type 2 diabetes mellitus Hemoglobin A1c 6.9, well controlled. Lantus 16 units [place] daily  Essential hypertension Continue metoprolol tartrate 100 mg p.o. twice daily, Verapamil 240 mg p.o. daily, Lasix 160 mg BID  GERD: Continue PPI  OSA: Continue nocturnal CPAP  HLD: Continue atorvastatin 40 mg p.o. daily  Obesity Body mass index is 36.68 kg/m.  Counseled on need for weight loss/lifestyle changes as this complicates all facets of care.   Discharge Diagnoses:  Principal Problem:   Cellulitis of left lower extremity Active Problems:   Class 2 severe  obesity due to excess calories with serious comorbidity and body mass index  (BMI) of 36.0 to 36.9 in adult (HCC)   PAF (paroxysmal atrial fibrillation) (HCC)   Type 2 diabetes mellitus with ESRD (end-stage renal disease) ([facility])   Essential hypertension   Gout   OSA on CPAP   ESRD (end stage renal disease) on dialysis ([place])   Mixed diabetic hyperlipidemia associated with type 2 diabetes mellitus (HCC)   GERD without esophagitis   Open wound of lower leg with complication, left, initial encounter   Cellulitis    Discharge Instructions  Discharge Instructions    (HEART FAILURE PATIENTS) Call MD:  Anytime you have any of the following symptoms: 1) 3 pound weight gain in 24 hours or 5 pounds in 1 week 2) shortness of breath, with or without a dry hacking cough 3) swelling in the hands, feet or stomach 4) if you have to sleep on extra pillows at night in order to breathe.   Complete by: As directed    Call MD for:  difficulty breathing, headache or visual disturbances   Complete by: As directed    Call MD for:  extreme fatigue   Complete by: As directed    Call MD for:  persistant dizziness or light-headedness   Complete by: As directed    Call MD for:  persistant nausea and vomiting   Complete by: As directed    Call MD for:  severe uncontrolled pain   Complete by: As directed    Call MD for:  temperature >100.4   Complete by: As directed    Diet - low sodium heart healthy   Complete by: As directed    Increase activity slowly   Complete by: As directed      Allergies as of 05/24/2019      Reactions   Grapefruit Concentrate Nausea And Vomiting   dizziness      Medication List    TAKE these medications   acetaminophen 650 MG CR tablet Commonly known as: TYLENOL Take 650 mg by mouth every 8 (eight) hours as needed for pain.   atorvastatin 40 MG tablet Commonly known as: LIPITOR Take 40 mg by mouth daily with lunch.   calcium acetate 667 MG capsule Commonly known as: PHOSLO Take 667 mg by mouth 3 (three) times daily with meals.    camphor-menthol lotion Commonly known as: SARNA Apply 1 application topically every 8 (eight) hours as needed for itching.   cephALEXin 500 MG capsule Commonly known as: KEFLEX Take 1 capsule (500 mg total) by mouth 3 (three) times daily for 8 days.   colchicine 0.6 MG tablet Take 0.6 mg by mouth daily as needed (gout pain).   DOCUSATE SODIUM PO Take 1 capsule by mouth at bedtime.   ezetimibe 10 MG tablet Commonly known as: ZETIA Take 1 tablet (10 mg total) by mouth daily. What changed: when to take this   febuxostat 40 MG tablet Commonly known as: ULORIC Take 40 mg by mouth daily with lunch.   Fish Oil 1000 MG Caps Take 2,000 mg by mouth daily.   furosemide 80 MG tablet Commonly known as: LASIX Take 160 mg by mouth See admin instructions. Take 2 tablets (160 mg) by mouth with lunch and supper on Monday, Wednesday, Friday (dialysis days), take 2 tablets (160 mg) with breakfast and supper on Sunday, Tuesday, Thursday, Saturday (non-dialysis days)   gabapentin 100 MG capsule Commonly known as: NEURONTIN TAKE ONE CAPSULE BY MOUTH AT BEDTIME  lanthanum 1000 MG chewable tablet Commonly known as: FOSRENOL Chew 2,000 mg by mouth 3 (three) times daily with meals.   metoprolol tartrate 50 MG tablet Commonly known as: LOPRESSOR TAKE 2 TABLETS BY MOUTH TWICE A DAY What changed:   when to take this  additional instructions   oxyCODONE-acetaminophen 5-325 MG tablet Commonly known as: PERCOCET/ROXICET Take 1 tablet by mouth every 6 (six) hours as needed for up to 5 days for moderate pain.   pantoprazole 40 MG tablet Commonly known as: PROTONIX Take 40 mg by mouth daily.   predniSONE 20 MG tablet Commonly known as: DELTASONE Take 20 mg by mouth daily as needed (gout flare up).   PRESCRIPTION MEDICATION Inhale into the lungs at bedtime. CPAP   Tresiba FlexTouch 100 UNIT/ML FlexTouch Pen Generic drug: insulin degludec Inject 16 Units into the skin daily before  lunch.   verapamil 120 MG 24 hr capsule Commonly known as: VERELAN PM Take 240 mg by mouth See admin instructions. Take 2 capsules (240 mg) by mouth with lunch on Monday, Wednesday, Friday (dialysis days), take 2 capsules (240 mg) with breakfast on Sunday, Tuesday, Thursday, Saturday (non-dialysis days)   warfarin 5 MG tablet Commonly known as: Coumadin Take as directed. If you are unsure how to take this medication, talk to your nurse or doctor. Original instructions: Take 1/2 to 1 tablet by mouth daily as directed by Coumadin clinic. What changed:   how much to take  how to take this  when to take this  additional instructions      Follow-up Information    Crist Infante, MD. Schedule an appointment as soon as possible for a visit in 1 week(s).   Specialty: Internal Medicine Contact information: [place] [address] [phone]        Fay Records, MD .   Specialty: Cardiology Contact information: [address] [place] [address] [phone]          Allergies  Allergen Reactions  . Grapefruit Concentrate Nausea And Vomiting    dizziness    Consultations:  None   Procedures/Studies: DG Tibia/Fibula Left  Result Date: 05/22/2019 CLINICAL DATA:  Leg wound EXAM: LEFT TIBIA AND FIBULA - 2 VIEW COMPARISON:  None. FINDINGS: No acute bony abnormality. Specifically, no fracture, subluxation, or dislocation. No radiographic changes of osteomyelitis. Diffuse vascular calcifications. Soft tissues are intact. IMPRESSION: No acute bony abnormality. Electronically Signed   By: Rolm Baptise M.D.   On: 05/22/2019 19:42      Subjective: Patient seen and examined bedside, resting comfortably.  Just work with occupational therapy this morning.  No specific complaints or concerns at this time.  Denies headache, no fever/chills/night sweats, no nausea/vomiting/diarrhea, no chest pain, no palpitations, no shortness of breath, no  abdominal pain.  No acute events overnight per nursing staff.  Discharge Exam: Vitals:   05/24/19 0613 05/24/19 0918  BP:  124/69  Pulse: 63 91  Resp:    Temp:    SpO2: 94%    Vitals:   05/23/19 2135 05/24/19 0536 05/24/19 0613 05/24/19 0918  BP:  (!) 92/49  124/69  Pulse: 77 61 63 91  Resp:  16    Temp:  98.4 F (36.9 C)    TempSrc:  Oral    SpO2:  (!) 89% 94%   Weight:      Height:        General: Pt is alert, awake, not in acute distress, obese Cardiovascular: RRR, S1/S2 +,  no rubs, no gallops Respiratory: CTA bilaterally, no wheezing, no rhonchi, oxygenating well on room air Abdominal: Soft, NT, ND, bowel sounds + Extremities:  1+ pitting edema bilateral lower extremities up to mid shin, no cyanosis    The results of significant diagnostics from this hospitalization (including imaging, microbiology, ancillary and laboratory) are listed below for reference.     Microbiology: Recent Results (from the past 240 hour(s))  SARS Coronavirus 2 by RT PCR (hospital order, performed in [facility] hospital lab) Nasopharyngeal Nasopharyngeal Swab     Status: None   Collection Time: 05/22/19 11:13 PM   Specimen: Nasopharyngeal Swab  Result Value Ref Range Status   SARS Coronavirus 2 NEGATIVE NEGATIVE Final    Comment: (NOTE) SARS-CoV-2 target nucleic acids are NOT DETECTED. The SARS-CoV-2 RNA is generally detectable in upper and lower respiratory specimens during the acute phase of infection. The lowest concentration of SARS-CoV-2 viral copies this assay can detect is 250 copies / mL. A negative result does not preclude SARS-CoV-2 infection and should not be used as the sole basis for treatment or other patient management decisions.  A negative result may occur with improper specimen collection / handling, submission of specimen other than nasopharyngeal swab, presence of viral mutation(s) within the areas targeted by this assay, and inadequate number of viral copies (<250  copies / mL). A negative result must be combined with clinical observations, patient history, and epidemiological information. Fact Sheet for Patients:   StrictlyIdeas.no Fact Sheet for Healthcare Providers: BankingDealers.co.za This test is not yet approved or cleared  by the [place] FDA and has been authorized for detection and/or diagnosis of SARS-CoV-2 by FDA under an Emergency Use Authorization (EUA).  This EUA will remain in effect (meaning this test can be used) for the duration of the COVID-19 declaration under Section 564(b)(1) of the Act, 21 U.S.C. section 360bbb-3(b)(1), unless the authorization is terminated or revoked sooner. Performed at [facility], [place] [address], [place], [place] [address]   Blood culture (routine x 2)     Status: None (Preliminary result)   Collection Time: 05/22/19 11:57 PM   Specimen: BLOOD LEFT HAND  Result Value Ref Range Status   Specimen Description BLOOD LEFT HAND  Final   Special Requests   Final    BOTTLES DRAWN AEROBIC AND ANAEROBIC Blood Culture adequate volume   Culture   Final    NO GROWTH 1 DAY Performed at [facility], [place] [address] [place], [place] [address]    Report Status PENDING  Incomplete  Blood culture (routine x 2)     Status: None (Preliminary result)   Collection Time: 05/23/19 12:09 AM   Specimen: BLOOD  Result Value Ref Range Status   Specimen Description BLOOD LEFT ANTECUBITAL  Final   Special Requests   Final    BOTTLES DRAWN AEROBIC AND ANAEROBIC Blood Culture adequate volume   Culture   Final    NO GROWTH 1 DAY Performed at [facility], [place] [address], [place], [place] [address]    Report Status PENDING  Incomplete  MRSA PCR Screening     Status: None   Collection Time: 05/23/19  4:56 AM   Specimen: Nasopharyngeal  Result Value Ref Range Status   MRSA by PCR NEGATIVE NEGATIVE Final    Comment:        The GeneXpert MRSA Assay  (FDA approved for NASAL specimens only), is one component of a comprehensive MRSA colonization surveillance program. It  is not intended to diagnose MRSA infection nor to guide or monitor treatment for MRSA infections. Performed at [facility], [place] [address] [place], [place] [address]      Labs: BNP (last 3 results) Recent Labs    02/16/19 0716  BNP 9,774.1*   Basic Metabolic Panel: Recent Labs  Lab 05/22/19 1150 05/23/19 0500 05/24/19 0402  NA 136 137 138  K 3.1* 3.4* 3.9  CL 93* 96* 96*  CO2 27 28 25   GLUCOSE 158* 123* 132*  BUN 21 34* 47*  CREATININE 3.73* 5.36* 6.82*  CALCIUM 9.3 9.6 9.6  MG  --  2.0 2.1   Liver Function Tests: Recent Labs  Lab 05/22/19 1150 05/23/19 0500  AST 26 23  ALT 25 22  ALKPHOS 154* 140*  BILITOT 0.8 0.7  PROT 7.8 7.2  ALBUMIN 2.8* 2.7*   No results for input(s): LIPASE, AMYLASE in the last 168 hours. No results for input(s): AMMONIA in the last 168 hours. CBC: Recent Labs  Lab 05/22/19 1150 05/23/19 0500 05/24/19 0402  WBC 6.9 6.5 7.2  NEUTROABS 4.7 3.6  --   HGB 9.7* 9.6* 9.2*  HCT 34.8* 34.5* 33.0*  MCV 77.5* 77.0* 76.6*  PLT 335 311 335   Cardiac Enzymes: No results for input(s): CKTOTAL, CKMB, CKMBINDEX, TROPONINI in the last 168 hours. BNP: Invalid input(s): POCBNP CBG: Recent Labs  Lab 05/23/19 0828 05/23/19 1112 05/23/19 1654 05/23/19 2125 05/24/19 0658  GLUCAP 128* 139* 114* 160* 126*   D-Dimer No results for input(s): DDIMER in the last 72 hours. Hgb A1c Recent Labs    05/22/19 2348  HGBA1C 6.9*   Lipid Profile No results for input(s): CHOL, HDL, LDLCALC, TRIG, CHOLHDL, LDLDIRECT in the last 72 hours. Thyroid function studies No results for input(s): TSH, T4TOTAL, T3FREE, THYROIDAB in the last 72 hours.  Invalid input(s): FREET3 Anemia work up No results for input(s): VITAMINB12, FOLATE, FERRITIN, TIBC, IRON, RETICCTPCT in the last 72 hours. Urinalysis    Component Value  Date/Time   COLORURINE YELLOW 07/23/2015 1522   APPEARANCEUR CLEAR 07/23/2015 1522   LABSPEC 1.013 07/23/2015 1522   PHURINE 6.0 07/23/2015 1522   GLUCOSEU 100 (A) 07/23/2015 1522   HGBUR SMALL (A) 07/23/2015 1522   BILIRUBINUR NEGATIVE 07/23/2015 1522   KETONESUR NEGATIVE 07/23/2015 1522   PROTEINUR >300 (A) 07/23/2015 1522   NITRITE NEGATIVE 07/23/2015 1522   LEUKOCYTESUR NEGATIVE 07/23/2015 1522   Sepsis Labs Invalid input(s): PROCALCITONIN,  WBC,  LACTICIDVEN Microbiology Recent Results (from the past 240 hour(s))  SARS Coronavirus 2 by RT PCR (hospital order, performed in [place] [facility] hospital lab) Nasopharyngeal Nasopharyngeal Swab     Status: None   Collection Time: 05/22/19 11:13 PM   Specimen: Nasopharyngeal Swab  Result Value Ref Range Status   SARS Coronavirus 2 NEGATIVE NEGATIVE Final    Comment: (NOTE) SARS-CoV-2 target nucleic acids are NOT DETECTED. The SARS-CoV-2 RNA is generally detectable in upper and lower respiratory specimens during the acute phase of infection. The lowest concentration of SARS-CoV-2 viral copies this assay can detect is 250 copies / mL. A negative result does not preclude SARS-CoV-2 infection and should not be used as the sole basis for treatment or other patient management decisions.  A negative result may occur with improper specimen collection / handling, submission of specimen other than nasopharyngeal swab, presence of viral mutation(s) within the areas targeted by this assay, and inadequate number of viral copies (<250 copies / mL). A negative result must be  combined with clinical observations, patient history, and epidemiological information. Fact Sheet for Patients:   StrictlyIdeas.no Fact Sheet for Healthcare Providers: BankingDealers.co.za This test is not yet approved or cleared  by the [place] FDA and has been authorized for detection and/or diagnosis of SARS-CoV-2 by FDA  under an Emergency Use Authorization (EUA).  This EUA will remain in effect (meaning this test can be used) for the duration of the COVID-19 declaration under Section 564(b)(1) of the Act, 21 U.S.C. section 360bbb-3(b)(1), unless the authorization is terminated or revoked sooner. Performed at [facility], [place] [address] [place], [place] [address]   Blood culture (routine x 2)     Status: None (Preliminary result)   Collection Time: 05/22/19 11:57 PM   Specimen: BLOOD LEFT HAND  Result Value Ref Range Status   Specimen Description BLOOD LEFT HAND  Final   Special Requests   Final    BOTTLES DRAWN AEROBIC AND ANAEROBIC Blood Culture adequate volume   Culture   Final    NO GROWTH 1 DAY Performed at [facility], [place] [address] [place], [place] [address]    Report Status PENDING  Incomplete  Blood culture (routine x 2)     Status: None (Preliminary result)   Collection Time: 05/23/19 12:09 AM   Specimen: BLOOD  Result Value Ref Range Status   Specimen Description BLOOD LEFT ANTECUBITAL  Final   Special Requests   Final    BOTTLES DRAWN AEROBIC AND ANAEROBIC Blood Culture adequate volume   Culture   Final    NO GROWTH 1 DAY Performed at [facility], [place] [address], [place], [place] [address]    Report Status PENDING  Incomplete  MRSA PCR Screening     Status: None   Collection Time: 05/23/19  4:56 AM   Specimen: Nasopharyngeal  Result Value Ref Range Status   MRSA by PCR NEGATIVE NEGATIVE Final    Comment:        The GeneXpert MRSA Assay (FDA approved for NASAL specimens only), is one component of a comprehensive MRSA colonization surveillance program. It is not intended to diagnose MRSA infection nor to guide or monitor treatment for MRSA infections. Performed at [facility], [place] [address], [place], [place] [address]      Time coordinating discharge: Over 30 minutes  SIGNED:   Eric J [place] DO  [facility] 05/24/2019, 9:58  AM

## 2019-05-24 NOTE — Discharge Instructions (Signed)
Cellulitis, Adult  Cellulitis is a skin infection. The infected area is usually warm, red, swollen, and tender. This condition occurs most often in the arms and lower legs. The infection can travel to the muscles, blood, and underlying tissue and become serious. It is very important to get treated for this condition. What are the causes? Cellulitis is caused by bacteria. The bacteria enter through a break in the skin, such as a cut, burn, insect bite, open sore, or crack. What increases the risk? This condition is more likely to occur in people who:  Have a weak body defense system (immune system).  Have open wounds on the skin, such as cuts, burns, bites, and scrapes. Bacteria can enter the body through these open wounds.  Are older than 63 years of age.  Have diabetes.  Have a type of long-lasting (chronic) liver disease (cirrhosis) or kidney disease.  Are obese.  Have a skin condition such as: ? Itchy rash (eczema). ? Slow movement of blood in the veins (venous stasis). ? Fluid buildup below the skin (edema).  Have had radiation therapy.  Use IV drugs. What are the signs or symptoms? Symptoms of this condition include:  Redness, streaking, or spotting on the skin.  Swollen area of the skin.  Tenderness or pain when an area of the skin is touched.  Warm skin.  A fever.  Chills.  Blisters. How is this diagnosed? This condition is diagnosed based on a medical history and physical exam. You may also have tests, including:  Blood tests.  Imaging tests. How is this treated? Treatment for this condition may include:  Medicines, such as antibiotic medicines or medicines to treat allergies (antihistamines).  Supportive care, such as rest and application of cold or warm cloths (compresses) to the skin.  Hospital care, if the condition is severe. The infection usually starts to get better within 1-2 days of treatment. Follow these instructions at  home:  Medicines  Take over-the-counter and prescription medicines only as told by your health care provider.  If you were prescribed an antibiotic medicine, take it as told by your health care provider. Do not stop taking the antibiotic even if you start to feel better. General instructions  Drink enough fluid to keep your urine pale yellow.  Do not touch or rub the infected area.  Raise (elevate) the infected area above the level of your heart while you are sitting or lying down.  Apply warm or cold compresses to the affected area as told by your health care provider.  Keep all follow-up visits as told by your health care provider. This is important. These visits let your health care provider make sure a more serious infection is not developing. Contact a health care provider if:  You have a fever.  Your symptoms do not begin to improve within 1-2 days of starting treatment.  Your bone or joint underneath the infected area becomes painful after the skin has healed.  Your infection returns in the same area or another area.  You notice a swollen bump in the infected area.  You develop new symptoms.  You have a general ill feeling (malaise) with muscle aches and pains. Get help right away if:  Your symptoms get worse.  You feel very sleepy.  You develop vomiting or diarrhea that persists.  You notice red streaks coming from the infected area.  Your red area gets larger or turns dark in color. These symptoms may represent a serious problem that is  an emergency. Do not wait to see if the symptoms will go away. Get medical help right away. Call your local emergency services (911 in the [place] Do not drive yourself to the hospital. Summary  Cellulitis is a skin infection. This condition occurs most often in the arms and lower legs.  Treatment for this condition may include medicines, such as antibiotic medicines or antihistamines.  Take over-the-counter and prescription  medicines only as told by your health care provider. If you were prescribed an antibiotic medicine, do not stop taking the antibiotic even if you start to feel better.  Contact a health care provider if your symptoms do not begin to improve within 1-2 days of starting treatment or your symptoms get worse.  Keep all follow-up visits as told by your health care provider. This is important. These visits let your health care provider make sure that a more serious infection is not developing. This information is not intended to replace advice given to you by your health care provider. Make sure you discuss any questions you have with your health care provider. Document Revised: 05/16/2017 Document Reviewed: 05/16/2017 Elsevier Patient Education  [place] on my medicine - Coumadin   (Warfarin)  This medication education was reviewed with me or my healthcare representative as part of my discharge preparation.  The pharmacist that spoke with me during my hospital stay was:  Onnie Boer, RPH-CPP  Why was Coumadin prescribed for you? Coumadin was prescribed for you because you have a blood clot or a medical condition that can cause an increased risk of forming blood clots. Blood clots can cause serious health problems by blocking the flow of blood to the heart, lung, or brain. Coumadin can prevent harmful blood clots from forming. As a reminder your indication for Coumadin is:   Stroke Prevention Because Of Atrial Fibrillation  What test will check on my response to Coumadin? While on Coumadin (warfarin) you will need to have an INR test regularly to ensure that your dose is keeping you in the desired range. The INR (international normalized ratio) number is calculated from the result of the laboratory test called prothrombin time (PT).  If an INR APPOINTMENT HAS NOT ALREADY BEEN MADE FOR YOU please schedule an appointment to have this lab work done by your health care provider  within 7 days. Your INR goal is usually a number between:  2 to 3 or your provider may give you a more narrow range like 2-2.5.  Ask your health care provider during an office visit what your goal INR is.  What  do you need to  know  About  COUMADIN? Take Coumadin (warfarin) exactly as prescribed by your healthcare provider about the same time each day.  DO NOT stop taking without talking to the doctor who prescribed the medication.  Stopping without other blood clot prevention medication to take the place of Coumadin may increase your risk of developing a new clot or stroke.  Get refills before you run out.  What do you do if you miss a dose? If you miss a dose, take it as soon as you remember on the same day then continue your regularly scheduled regimen the next day.  Do not take two doses of Coumadin at the same time.  Important Safety Information A possible side effect of Coumadin (Warfarin) is an increased risk of bleeding. You should call your healthcare provider right away if you experience any of the following: ? Bleeding  from an injury or your nose that does not stop. ? Unusual colored urine (red or dark brown) or unusual colored stools (red or black). ? Unusual bruising for unknown reasons. ? A serious fall or if you hit your head (even if there is no bleeding).  Some foods or medicines interact with Coumadin (warfarin) and might alter your response to warfarin. To help avoid this: ? Eat a balanced diet, maintaining a consistent amount of Vitamin K. ? Notify your provider about major diet changes you plan to make. ? Avoid alcohol or limit your intake to 1 drink for women and 2 drinks for men per day. (1 drink is 5 oz. wine, 12 oz. beer, or 1.5 oz. liquor.)  Make sure that ANY health care provider who prescribes medication for you knows that you are taking Coumadin (warfarin).  Also make sure the healthcare provider who is monitoring your Coumadin knows when you have started a new  medication including herbals and non-prescription products.  Coumadin (Warfarin)  Major Drug Interactions  Increased Warfarin Effect Decreased Warfarin Effect  Alcohol (large quantities) Antibiotics (esp. Septra/Bactrim, Flagyl, Cipro) Amiodarone (Cordarone) Aspirin (ASA) Cimetidine (Tagamet) Megestrol (Megace) NSAIDs (ibuprofen, naproxen, etc.) Piroxicam (Feldene) Propafenone (Rythmol SR) Propranolol (Inderal) Isoniazid (INH) Posaconazole (Noxafil) Barbiturates (Phenobarbital) Carbamazepine (Tegretol) Chlordiazepoxide (Librium) Cholestyramine (Questran) Griseofulvin Oral Contraceptives Rifampin Sucralfate (Carafate) Vitamin K   Coumadin (Warfarin) Major Herbal Interactions  Increased Warfarin Effect Decreased Warfarin Effect  Garlic Ginseng Ginkgo biloba Coenzyme Q10 Green tea St. John's wort    Coumadin (Warfarin) FOOD Interactions  Eat a consistent number of servings per week of foods HIGH in Vitamin K (1 serving =  cup)  Collards (cooked, or boiled & drained) Kale (cooked, or boiled & drained) Mustard greens (cooked, or boiled & drained) Parsley *serving size only =  cup Spinach (cooked, or boiled & drained) Swiss chard (cooked, or boiled & drained) Turnip greens (cooked, or boiled & drained)  Eat a consistent number of servings per week of foods MEDIUM-HIGH in Vitamin K (1 serving = 1 cup)  Asparagus (cooked, or boiled & drained) Broccoli (cooked, boiled & drained, or raw & chopped) Brussel sprouts (cooked, or boiled & drained) *serving size only =  cup Lettuce, raw (green leaf, endive, romaine) Spinach, raw Turnip greens, raw & chopped   These websites have more information on Coumadin (warfarin):  FailFactory.se; VeganReport.com.au;

## 2019-05-24 NOTE — TOC Transition Note (Signed)
Transition of Care [facility]) - CM/SW Discharge Note   Patient Details  Name: Kevin James MRN: [id number] Date of Birth: 05-Apr-1955  [facility]) CM/SW Contact:  Carles Collet, RN Phone Number: 05/24/2019, 10:58 AM   Clinical Narrative:    Damaris Schooner w patient, he politely declined [place] services. He states that it will not be neccessary. He declines DME.     Final next level of care: Home/Self Care Barriers to Discharge: No Barriers Identified   Patient Goals and CMS Choice        Discharge Placement                       Discharge Plan and Services                  DME Agency: NA       HH Arranged: Patient Refused HH          Social Determinants of Health (SDOH) Interventions     Readmission Risk Interventions No flowsheet data found.

## 2019-05-24 NOTE — Progress Notes (Signed)
DISCHARGE NOTE HOME Kevin James to be discharged Home per MD order. Discussed prescriptions and follow up appointments with the patient. Prescriptions given to patient; medication list explained in detail. Patient verbalized understanding.  Skin clean, dry and intact without evidence of skin break down, no evidence of skin tears noted. IV catheter discontinued intact. Site without signs and symptoms of complications. Dressing and pressure applied. Pt denies pain at the site currently. No complaints noted.  Patient free of lines, drains, and wounds.   An After Visit Summary (AVS) was printed and given to the patient. Patient escorted via wheelchair, and discharged home via private auto.  Arlyss Repress, RN
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
## 2019-05-26 ENCOUNTER — Other Ambulatory Visit: Payer: Self-pay | Admitting: Neurology

## 2019-05-26 DIAGNOSIS — G629 Polyneuropathy, unspecified: Secondary | ICD-10-CM

## 2019-05-26 DIAGNOSIS — G2581 Restless legs syndrome: Secondary | ICD-10-CM

## 2019-05-26 DIAGNOSIS — I482 Chronic atrial fibrillation, unspecified: Secondary | ICD-10-CM

## 2019-05-26 DIAGNOSIS — G4761 Periodic limb movement disorder: Secondary | ICD-10-CM

## 2019-05-26 DIAGNOSIS — G4733 Obstructive sleep apnea (adult) (pediatric): Secondary | ICD-10-CM

## 2019-05-28 LAB — CULTURE, BLOOD (ROUTINE X 2)
Culture: NO GROWTH
Culture: NO GROWTH
Special Requests: ADEQUATE
Special Requests: ADEQUATE

## 2019-06-09 ENCOUNTER — Telehealth: Payer: Self-pay | Admitting: Pharmacist

## 2019-06-09 NOTE — Telephone Encounter (Signed)
Food Engineer, manufacturing systems called, requesting to see if brand of coumadin could be changed.  Authorized pharmacy to change brand as long as patient is comfortable with switch

## 2019-06-11 ENCOUNTER — Ambulatory Visit: Payer: Medicare Other | Admitting: Physician Assistant

## 2019-06-16 ENCOUNTER — Ambulatory Visit: Payer: Self-pay | Admitting: Adult Health

## 2019-06-26 ENCOUNTER — Other Ambulatory Visit: Payer: Self-pay | Admitting: Neurology

## 2019-06-26 DIAGNOSIS — G4733 Obstructive sleep apnea (adult) (pediatric): Secondary | ICD-10-CM

## 2019-06-26 DIAGNOSIS — G4761 Periodic limb movement disorder: Secondary | ICD-10-CM

## 2019-06-26 DIAGNOSIS — G629 Polyneuropathy, unspecified: Secondary | ICD-10-CM

## 2019-06-26 DIAGNOSIS — I482 Chronic atrial fibrillation, unspecified: Secondary | ICD-10-CM

## 2019-06-26 DIAGNOSIS — G2581 Restless legs syndrome: Secondary | ICD-10-CM

## 2019-06-30 ENCOUNTER — Ambulatory Visit: Payer: Medicare Other | Admitting: Nurse Practitioner

## 2019-07-28 ENCOUNTER — Ambulatory Visit: Payer: Medicare Other | Admitting: Physician Assistant

## 2019-08-06 ENCOUNTER — Telehealth: Payer: Self-pay

## 2019-08-06 NOTE — Telephone Encounter (Signed)
lmom overdue inr

## 2019-08-13 ENCOUNTER — Telehealth: Payer: Self-pay | Admitting: Pharmacist

## 2019-08-13 NOTE — Telephone Encounter (Signed)
Called pt - overdue for INR. Pt states he is in a rehab facility, and that MD at [facility] took him off of warfarin and changed him to Eliquis. Med list has been updated.

## 2019-08-18 ENCOUNTER — Encounter: Payer: Self-pay | Admitting: Adult Health

## 2019-08-18 ENCOUNTER — Ambulatory Visit: Payer: Self-pay | Admitting: Adult Health

## 2019-10-09 DEATH — deceased
